# Patient Record
Sex: Male | Born: 1960
Health system: Southern US, Community
[De-identification: ages and names within clinical notes are randomized; demographics above are authoritative.]

## PROBLEM LIST (undated history)

## (undated) DIAGNOSIS — G20A1 Parkinson's disease without dyskinesia, without mention of fluctuations: Secondary | ICD-10-CM

## (undated) DIAGNOSIS — K219 Gastro-esophageal reflux disease without esophagitis: Secondary | ICD-10-CM

## (undated) DIAGNOSIS — R7301 Impaired fasting glucose: Secondary | ICD-10-CM

## (undated) DIAGNOSIS — Z8042 Family history of malignant neoplasm of prostate: Secondary | ICD-10-CM

## (undated) DIAGNOSIS — G20 Parkinson's disease: Secondary | ICD-10-CM

## (undated) HISTORY — DX: Impaired fasting glucose: R73.01

## (undated) HISTORY — DX: Parkinson's disease: G20

## (undated) HISTORY — DX: Parkinson's disease without dyskinesia, without mention of fluctuations: G20.A1

## (undated) HISTORY — DX: Family history of malignant neoplasm of prostate: Z80.42

## (undated) HISTORY — PX: KNEE SURGERY: SHX244

## (undated) HISTORY — DX: Gastro-esophageal reflux disease without esophagitis: K21.9

## (undated) HISTORY — PX: VASECTOMY: SHX75

## (undated) HISTORY — PX: TONSILLECTOMY AND ADENOIDECTOMY: SUR1326

---

## 2004-03-17 ENCOUNTER — Ambulatory Visit: Payer: Self-pay | Admitting: Internal Medicine

## 2005-06-29 ENCOUNTER — Ambulatory Visit: Payer: Self-pay | Admitting: Family Medicine

## 2005-07-31 ENCOUNTER — Ambulatory Visit: Payer: Self-pay | Admitting: Internal Medicine

## 2005-10-03 ENCOUNTER — Ambulatory Visit: Payer: Self-pay | Admitting: Internal Medicine

## 2006-06-14 DIAGNOSIS — Z9089 Acquired absence of other organs: Secondary | ICD-10-CM | POA: Insufficient documentation

## 2006-06-15 ENCOUNTER — Ambulatory Visit: Payer: Self-pay | Admitting: Internal Medicine

## 2006-07-18 ENCOUNTER — Ambulatory Visit: Payer: Self-pay | Admitting: Internal Medicine

## 2006-07-19 ENCOUNTER — Telehealth (INDEPENDENT_AMBULATORY_CARE_PROVIDER_SITE_OTHER): Payer: Self-pay | Admitting: *Deleted

## 2006-07-19 LAB — CONVERTED CEMR LAB
Free T4: 0.9 ng/dL (ref 0.6–1.6)
T3, Free: 3.5 pg/mL (ref 2.3–4.2)
TSH: 0.87 microintl units/mL (ref 0.35–5.50)

## 2006-07-20 ENCOUNTER — Encounter: Payer: Self-pay | Admitting: Internal Medicine

## 2006-07-26 ENCOUNTER — Telehealth (INDEPENDENT_AMBULATORY_CARE_PROVIDER_SITE_OTHER): Payer: Self-pay | Admitting: *Deleted

## 2006-07-27 LAB — CONVERTED CEMR LAB
Catecholamines Tot(E+NE) 24 Hr U: 0.052 mg/24hr
Dopamine 24 Hr Urine: 226 mcg/24hr (ref <500)
Epinephrine 24 Hr Urine: 6 mcg/24hr (ref <20)
Metaneph Total, Ur: 226 ug/24hr (ref 90–690)
Metanephrines, Ur: 81 (ref 26–230)
Norepinephrine 24 Hr Urine: 46 mcg/24hr (ref <80)
Normetanephrine, 24H Ur: 145 (ref 44–540)

## 2006-07-30 ENCOUNTER — Telehealth (INDEPENDENT_AMBULATORY_CARE_PROVIDER_SITE_OTHER): Payer: Self-pay | Admitting: *Deleted

## 2006-09-06 ENCOUNTER — Encounter (INDEPENDENT_AMBULATORY_CARE_PROVIDER_SITE_OTHER): Payer: Self-pay | Admitting: *Deleted

## 2006-09-06 ENCOUNTER — Ambulatory Visit: Payer: Self-pay | Admitting: Family Medicine

## 2006-09-06 DIAGNOSIS — R739 Hyperglycemia, unspecified: Secondary | ICD-10-CM | POA: Insufficient documentation

## 2006-09-06 LAB — CONVERTED CEMR LAB
Bilirubin Urine: NEGATIVE
Blood in Urine, dipstick: NEGATIVE
Glucose, Bld: 124 mg/dL
Glucose, Urine, Semiquant: NEGATIVE
Ketones, urine, test strip: NEGATIVE
Nitrite: NEGATIVE
Protein, U semiquant: NEGATIVE
Specific Gravity, Urine: 1.01
Urobilinogen, UA: NEGATIVE
WBC Urine, dipstick: NEGATIVE
pH: 6.5

## 2006-09-07 LAB — CONVERTED CEMR LAB
BUN: 15 mg/dL (ref 6–23)
CO2: 31 meq/L (ref 19–32)
Calcium: 9.6 mg/dL (ref 8.4–10.5)
Chloride: 102 meq/L (ref 96–112)
Creatinine, Ser: 0.8 mg/dL (ref 0.4–1.5)
GFR calc Af Amer: 136 mL/min
GFR calc non Af Amer: 112 mL/min
Glucose, Bld: 86 mg/dL (ref 70–99)
Hgb A1c MFr Bld: 5.5 % (ref 4.6–6.0)
Potassium: 4 meq/L (ref 3.5–5.1)
Sodium: 141 meq/L (ref 135–145)

## 2006-11-02 ENCOUNTER — Ambulatory Visit: Payer: Self-pay | Admitting: Internal Medicine

## 2006-11-02 ENCOUNTER — Telehealth (INDEPENDENT_AMBULATORY_CARE_PROVIDER_SITE_OTHER): Payer: Self-pay | Admitting: *Deleted

## 2006-11-02 DIAGNOSIS — H9319 Tinnitus, unspecified ear: Secondary | ICD-10-CM | POA: Insufficient documentation

## 2006-11-07 ENCOUNTER — Encounter (INDEPENDENT_AMBULATORY_CARE_PROVIDER_SITE_OTHER): Payer: Self-pay | Admitting: *Deleted

## 2006-11-07 ENCOUNTER — Telehealth (INDEPENDENT_AMBULATORY_CARE_PROVIDER_SITE_OTHER): Payer: Self-pay | Admitting: *Deleted

## 2006-12-11 ENCOUNTER — Ambulatory Visit: Payer: Self-pay | Admitting: Internal Medicine

## 2006-12-11 DIAGNOSIS — F411 Generalized anxiety disorder: Secondary | ICD-10-CM | POA: Insufficient documentation

## 2007-06-12 ENCOUNTER — Telehealth (INDEPENDENT_AMBULATORY_CARE_PROVIDER_SITE_OTHER): Payer: Self-pay | Admitting: *Deleted

## 2007-07-01 ENCOUNTER — Ambulatory Visit: Payer: Self-pay | Admitting: Internal Medicine

## 2007-08-09 ENCOUNTER — Ambulatory Visit: Payer: Self-pay | Admitting: Internal Medicine

## 2007-11-14 ENCOUNTER — Ambulatory Visit: Payer: Self-pay | Admitting: Internal Medicine

## 2007-11-14 DIAGNOSIS — K219 Gastro-esophageal reflux disease without esophagitis: Secondary | ICD-10-CM | POA: Insufficient documentation

## 2007-11-15 ENCOUNTER — Encounter: Payer: Self-pay | Admitting: Internal Medicine

## 2007-11-19 ENCOUNTER — Telehealth (INDEPENDENT_AMBULATORY_CARE_PROVIDER_SITE_OTHER): Payer: Self-pay | Admitting: *Deleted

## 2007-11-27 ENCOUNTER — Encounter (INDEPENDENT_AMBULATORY_CARE_PROVIDER_SITE_OTHER): Payer: Self-pay | Admitting: *Deleted

## 2008-01-17 ENCOUNTER — Ambulatory Visit: Payer: Self-pay | Admitting: Internal Medicine

## 2008-02-25 ENCOUNTER — Telehealth (INDEPENDENT_AMBULATORY_CARE_PROVIDER_SITE_OTHER): Payer: Self-pay | Admitting: *Deleted

## 2008-10-21 ENCOUNTER — Telehealth (INDEPENDENT_AMBULATORY_CARE_PROVIDER_SITE_OTHER): Payer: Self-pay | Admitting: *Deleted

## 2009-02-26 ENCOUNTER — Ambulatory Visit: Payer: Self-pay | Admitting: Internal Medicine

## 2009-06-24 ENCOUNTER — Telehealth (INDEPENDENT_AMBULATORY_CARE_PROVIDER_SITE_OTHER): Payer: Self-pay | Admitting: *Deleted

## 2009-07-26 ENCOUNTER — Ambulatory Visit: Payer: Self-pay | Admitting: Family

## 2010-02-28 ENCOUNTER — Ambulatory Visit
Admission: RE | Admit: 2010-02-28 | Discharge: 2010-02-28 | Payer: Self-pay | Source: Home / Self Care | Attending: Internal Medicine | Admitting: Internal Medicine

## 2010-02-28 ENCOUNTER — Other Ambulatory Visit: Payer: Self-pay | Admitting: Internal Medicine

## 2010-02-28 ENCOUNTER — Encounter: Payer: Self-pay | Admitting: Internal Medicine

## 2010-02-28 LAB — TSH: TSH: 1.05 u[IU]/mL (ref 0.35–5.50)

## 2010-02-28 LAB — CBC WITH DIFFERENTIAL/PLATELET
Basophils Absolute: 0 10*3/uL (ref 0.0–0.1)
Basophils Relative: 0.2 % (ref 0.0–3.0)
Eosinophils Absolute: 0 10*3/uL (ref 0.0–0.7)
Eosinophils Relative: 0.6 % (ref 0.0–5.0)
HCT: 42.8 % (ref 39.0–52.0)
Hemoglobin: 14.6 g/dL (ref 13.0–17.0)
Lymphocytes Relative: 15.8 % (ref 12.0–46.0)
Lymphs Abs: 1.1 10*3/uL (ref 0.7–4.0)
MCHC: 34.2 g/dL (ref 30.0–36.0)
MCV: 95.1 fl (ref 78.0–100.0)
Monocytes Absolute: 1.1 10*3/uL — ABNORMAL HIGH (ref 0.1–1.0)
Monocytes Relative: 14.9 % — ABNORMAL HIGH (ref 3.0–12.0)
Neutro Abs: 4.9 10*3/uL (ref 1.4–7.7)
Neutrophils Relative %: 68.5 % (ref 43.0–77.0)
Platelets: 141 10*3/uL — ABNORMAL LOW (ref 150.0–400.0)
RBC: 4.51 Mil/uL (ref 4.22–5.81)
RDW: 12.6 % (ref 11.5–14.6)
WBC: 7.2 10*3/uL (ref 4.5–10.5)

## 2010-02-28 LAB — BASIC METABOLIC PANEL
BUN: 13 mg/dL (ref 6–23)
CO2: 28 mEq/L (ref 19–32)
Calcium: 8.9 mg/dL (ref 8.4–10.5)
Chloride: 100 mEq/L (ref 96–112)
Creatinine, Ser: 1 mg/dL (ref 0.4–1.5)
GFR: 89.14 mL/min (ref >60.00)
Glucose, Bld: 99 mg/dL (ref 70–99)
Potassium: 3.7 mEq/L (ref 3.5–5.1)
Sodium: 136 mEq/L (ref 135–145)

## 2010-02-28 LAB — LIPID PANEL
Cholesterol: 201 mg/dL — ABNORMAL HIGH (ref 0–200)
HDL: 75 mg/dL (ref >39.00)
Total CHOL/HDL Ratio: 3
Triglycerides: 48 mg/dL (ref 0.0–149.0)
VLDL: 9.6 mg/dL (ref 0.0–40.0)

## 2010-02-28 LAB — HEPATIC FUNCTION PANEL
ALT: 63 U/L — ABNORMAL HIGH (ref 0–53)
AST: 53 U/L — ABNORMAL HIGH (ref 0–37)
Albumin: 4 g/dL (ref 3.5–5.2)
Alkaline Phosphatase: 54 U/L (ref 39–117)
Bilirubin, Direct: 0.1 mg/dL (ref 0.0–0.3)
Total Bilirubin: 0.7 mg/dL (ref 0.3–1.2)
Total Protein: 6.7 g/dL (ref 6.0–8.3)

## 2010-02-28 LAB — PSA: PSA: 0.37 ng/mL (ref 0.10–4.00)

## 2010-02-28 LAB — LDL CHOLESTEROL, DIRECT: Direct LDL: 121.1 mg/dL

## 2010-03-13 LAB — CONVERTED CEMR LAB
AST: 43 units/L — ABNORMAL HIGH (ref 0–37)
Alkaline Phosphatase: 58 units/L (ref 39–117)
Bilirubin, Direct: 0.2 mg/dL (ref 0.0–0.3)
Chloride: 103 meq/L (ref 96–112)
Cholesterol: 209 mg/dL (ref 0–200)
Eosinophils Absolute: 0.1 10*3/uL (ref 0.0–0.7)
GFR calc Af Amer: 118 mL/min
GFR calc non Af Amer: 97 mL/min
HCT: 44.6 % (ref 39.0–52.0)
HDL: 58.3 mg/dL (ref >39.0)
MCV: 94.6 fL (ref 78.0–100.0)
Monocytes Absolute: 0.5 10*3/uL (ref 0.1–1.0)
Monocytes Relative: 10.9 % (ref 3.0–12.0)
Neutrophils Relative %: 61.7 % (ref 43.0–77.0)
PSA: 0.38 ng/mL (ref 0.10–4.00)
Platelets: 167 10*3/uL (ref 150–400)
Potassium: 4.4 meq/L (ref 3.5–5.1)
RDW: 11.7 % (ref 11.5–14.6)
Sodium: 142 meq/L (ref 135–145)
TSH: 1.01 microintl units/mL (ref 0.35–5.50)
Total CHOL/HDL Ratio: 3.6
Triglycerides: 44 mg/dL (ref 0–149)

## 2010-03-15 NOTE — Miscellaneous (Signed)
Summary: solumedrol injection  Clinical Lists Changes  Orders: Added new Service order of Admin of Therapeutic Inj  intramuscular or subcutaneous (60454) - Signed Added new Service order of Solumedrol up to 125mg  (U9811) - Signed      Medication Administration  Injection # 1:    Medication: Solumedrol up to 125mg     Diagnosis: POISON IVY DERMATITIS (ICD-692.6)    Route: IM    Site: RUOQ gluteus    Exp Date: 10/14/2011    Lot #: [id number]    Mfr: Pharmacia    Patient tolerated injection without complications    Given by: Mervin Kung CMA (July 26, 2009 5:05 PM)  Orders Added: 1)  Admin of Therapeutic Inj  intramuscular or subcutaneous [96372] 2)  Solumedrol up to 125mg  [J2930]

## 2010-03-15 NOTE — Progress Notes (Signed)
Summary: Refill Request  Phone Note Refill Request Call back at Home Phone [phone] Call back at [phone] Message from:  Patient on Jun 24, 2009 2:37 PM  Refills Requested: Medication #1:  LORAZEPAM 0.5 MG TABS 1 every 8 -12 hrs as needed   Dosage confirmed as above?Dosage Confirmed   [facility] [address] Call patient with any questions at [phone]  Next Appointment Scheduled: none Initial call taken by: Harold Barban,  Jun 24, 2009 2:37 PM    Prescriptions: LORAZEPAM 0.5 MG TABS (LORAZEPAM) 1 every 8 -12 hrs as needed  #60 x 0   Entered by:   Shonna Chock   Authorized by:   Marga Melnick MD   Signed by:   Shonna Chock on 06/24/2009   Method used:   Printed then faxed to ...       [facility] [phone]* (retail)       [address]       [place], [place]  [address]       Ph: [phone]       Fax: [phone]   RxID:   [id number]

## 2010-03-15 NOTE — Assessment & Plan Note (Signed)
Summary: cpx/ns/kdc   Vital Signs:  Patient profile:   50 year old male Height:      73.5 inches Weight:      190.8 pounds BMI:     24.92 Temp:     98.0 degrees F oral Pulse rate:   6 / minute Resp:     14 per minute BP sitting:   114 / 82  (left arm) Cuff size:   large  Vitals Entered By: Shonna Chock (February 26, 2009 11:13 AM)  CC: CPX, not fasting today, General Medical Evaluation Comments REVIEWED MED LIST, PATIENT AGREED DOSE AND INSTRUCTION CORRECT    Primary Care Provider:  Laury Axon  CC:  CPX, not fasting today, and General Medical Evaluation.  History of Present Illness: Mark [name] is here for a physical ; he is asymptomatic except for residual pain from rib fracture post fall on ice 17 days ago.  Allergies: 1)  ! Pcn 2)  ! 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   Resp:  Complains of chest pain with inspiration; denies coughing up blood, shortness of breath, and wheezing; Rx: Hydrocodone at bedtime .  Physical Exam  General:  well-nourished,in no acute distress; alert,appropriate and cooperative throughout examination Head:   Normocephalic and atraumatic without obvious abnormalities. No apparent alopecia ; moustache & goatee Eyes:  No corneal or conjunctival inflammation noted. Perrla. Funduscopic exam benign, without hemorrhages, exudates or papilledema.  Ears:  External ear exam shows no significant lesions or deformities.  Otoscopic examination reveals clear canals, tympanic membranes are intact bilaterally without bulging, retraction, inflammation or discharge. Hearing is grossly normal bilaterally. Nose:  External nasal examination shows no deformity or inflammation. Nasal mucosa are pink and moist without lesions or exudates.Septum to R Mouth:  Oral mucosa and oropharynx without lesions or exudates.  Teeth in good repair. Neck:  No deformities, masses, or tenderness noted. Chest Wall:  Tender over rib fracture on L Lungs:  Normal respiratory effort, chest expands symmetrically. Lungs are clear to auscultation, no crackles or wheezes.Moves deliberately due to fracture Heart:  Normal rate and regular rhythm. S1 and S2 normal without gallop, murmur, click, rub . S4 Abdomen:  Bowel sounds positive,abdomen soft and non-tender without masses, organomegaly or hernias noted. Rectal:  No external abnormalities noted. Normal sphincter tone. No rectal masses or tenderness. Genitalia:  Testes bilaterally descended without nodularity, tenderness or masses. No scrotal masses or lesions. No penis lesions or urethral discharge. L varicocele.   Prostate:  Prostate gland firm and smooth, no enlargement, nodularity, tenderness, mass, asymmetry or induration. Msk:  No deformity or scoliosis noted of thoracic or lumbar spine.   Pulses:  R and L carotid,radial,dorsalis pedis and posterior tibial pulses are full and equal bilaterally Extremities:  No clubbing, cyanosis, edema, or deformity noted with normal full range of motion of all joints.   Neurologic:  alert &  oriented X3 and DTRs symmetrical and normal.   Skin:  Intact without  suspicious lesions or rashes. No bruie L chest Cervical Nodes:  No lymphadenopathy noted Axillary Nodes:  No palpable lymphadenopathy Psych:  memory intact for recent and remote, normally interactive, and good eye contact.     Impression & Recommendations:  Problem # 1:  PREVENTIVE HEALTH CARE (ICD-V70.0)  Orders: EKG w/ Interpretation (93000)  Problem # 2:  FRACTURE, RIB, LEFT (ICD-807.00)  Problem # 3:  NEOPLASM, MALIGNANT, PROSTATE, FAMILY HX, FATHER (ICD-V16.42)  Problem # 4:  BREAST CANCER, FAMILY HX (ICD-V16.3)  Problem # 5:  GERD (ICD-530.81) stable His updated medication list for this problem includes:    Ranitidine Hcl 150 Mg Tabs (Ranitidine hcl) .Marland Kitchen... 1 every 12 hrs ac prn  Complete Medication List: 1)  Lorazepam 0.5 Mg Tabs (Lorazepam) .Marland Kitchen.. 1 every 8 -12 hrs as needed 2)  Ranitidine Hcl 150 Mg Tabs (Ranitidine hcl) .Marland Kitchen.. 1 every 12 hrs ac prn  Patient Instructions: 1)  Avoid foods high in acid (tomatoes, citrus juices, spicy foods). Avoid eating within two hours of lying down or before exercising. Do not over eat; try smaller more frequent meals. Elevate head of bed twelve inches when sleeping.Calcium 600 mg daily & vit D 1000 IU daily X 6 weeks. Please schedule fasting labs: 2)  BMP ; 3)  Hepatic Panel ; 4)  Lipid Panel ; 5)  TSH ; 6)  CBC w/ Diff; 7)  PSA

## 2010-03-15 NOTE — Letter (Signed)
Summary: Cancer Screening/[facility] Personalized Risk Profile  Cancer Screening/[facility] Personalized Risk Profile   Imported By: Lanelle Bal 03/03/2009 12:30:24  _____________________________________________________________________  External Attachment:    Type:   Image     Comment:   External Document

## 2010-03-15 NOTE — Assessment & Plan Note (Signed)
Summary: POISON IVY? ON LEGS--MAY COME IN AT 1:35///SPHRoom 5   Vital Signs:  Patient profile:   50 year old male Height:      73.5 inches Weight:      187.25 pounds BMI:     24.46 Temp:     97.7 degrees F oral Pulse rate:   66 / minute Pulse rhythm:   regular Resp:     16 per minute BP sitting:   110 / 70  (right arm) Cuff size:   regular  Vitals Entered By: Mervin Kung CMA (July 26, 2009 1:44 PM) CC: Room 5  Red, ichy rash on legs and spreading to face. Is Patient Diabetic? No Comments Pt states he does not need Ranitidine at all and is no longer taking.   Primary Care Provider:  Laury Axon  CC:  Room 5  Red and ichy rash on legs and spreading to face.Marland Kitchen  History of Present Illness: Mark [name] is a 50 year old male who presents today with complaint of red itching rash which started on Friday.  Did yard work on Thursday.  Patient has tried OTC "no itch cream" without much improvement.  He comes today to seek further  treatment of his rash.    Allergies: 1)  ! Pcn 2)  ! * Paraxetine  Physical Exam  General:  Well-developed,well-nourished,in no acute distress; alert,appropriate and cooperative throughout examination Skin:  + raised rash noted behind left knee.  Left shin.  Notes + itching beside nose (L) but no clear rash as of yet Psych:  Cognition and judgment appear intact. Alert and cooperative with normal attention span and concentration. No apparent delusions, illusions, hallucinations   Impression & Recommendations:  Problem # 1:  POISON IVY DERMATITIS (ICD-692.6) Assessment New  Plan solumedrol IM today, followed by a short prednisone taper.  Pt instructed to call if symptoms worsen or do not improve.  His updated medication list for this problem includes:    Prednisone 10 Mg Tabs (Prednisone) .Marland Kitchen... Take 4 tablets daily x 2 days, then 3 tabs daily x 2 days, then 2 tabs daily x 2 days, then 1 tab daily for 2 days then stop.  Complete Medication List: 1)   Lorazepam 0.5 Mg Tabs (Lorazepam) .Marland Kitchen.. 1 every 8 -12 hrs as needed 2)  Prednisone 10 Mg Tabs (Prednisone) .... Take 4 tablets daily x 2 days, then 3 tabs daily x 2 days, then 2 tabs daily x 2 days, then 1 tab daily for 2 days then stop.  Patient Instructions: 1)  Call if symptoms worsen or do not improve. 2)  You can take Benadryl 25mg  by mouth at bedtime as needed for itching.  This med can also be used during the day- however it may make you sleepy. Prescriptions: PREDNISONE 10 MG TABS (PREDNISONE) take 4 tablets daily x 2 days, then 3 tabs daily x 2 days, then 2 tabs daily x 2 days, then 1 tab daily for 2 days then stop.  #20 x 0   Entered and Authorized by:   Mark Fillers FNP   Signed by:   Mark Fillers FNP on 07/26/2009   Method used:   [facility] to        [facility] [phone]* (retail)       [address]       [place], [place]  [address]       Ph: [phone]  Fax: [phone]   RxID:   [id number]   Current Allergies (reviewed today): ! PCN ! * PARAXETINE

## 2010-03-17 NOTE — Assessment & Plan Note (Signed)
Summary: cpx/lab/cbs   Vital Signs:  Patient profile:   50 year old male Height:      74 inches Weight:      183.6 pounds Temp:     98.1 degrees F oral Pulse rate:   64 / minute Resp:     14 per minute BP sitting:   116 / 80  (left arm) Cuff size:   regular  Vitals Entered By: Shonna Chock CMA (February 28, 2010 8:37 AM)    Primary Care Provider:  Laury Axon   History of Present Illness:    Mr. Mark [name] is here for a physical; he is asymptomatic.   The patient reports nasal congestion,  intermittent sore throat, and productive cough with clear sputum X 3 days. He  denies purulent nasal discharge and earache.  The patient denies fever, dyspnea, and wheezing.  The patient denies frontal  headache & bilateral facial pain and tooth pain.    Current Medications (verified): 1)  Lorazepam 0.5 Mg Tabs (Lorazepam) .Marland Kitchen.. 1 Every 8 -12 Hrs As Needed  Allergies: 1)  ! Pcn 2)  ! 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    Physical Exam  General:  well-nourished; alert,appropriate and cooperative throughout  examination Head:  Normocephalic and atraumatic without obvious abnormalities. Goatee Eyes:  No corneal or conjunctival inflammation noted.Perrla. Funduscopic exam benign, without hemorrhages, exudates or papilledema.  Ears:  External ear exam shows no significant lesions or deformities.  Otoscopic examination reveals clear canals, tympanic membranes are intact bilaterally without bulging, retraction, inflammation or discharge. Hearing is grossly normal bilaterally. Nose:  External nasal examination shows no deformity or inflammation. Nasal mucosa are pink and moist without lesions or exudates. Mouth:  Oral mucosa and oropharynx without lesions or exudates.  Teeth in good repair. Neck:  No deformities, masses, or tenderness noted. Lungs:  Normal respiratory effort, chest expands symmetrically. Lungs are clear to auscultation, no crackles or wheezes. Heart:  Normal rate and regular rhythm. S1 and S2 normal without gallop, murmur, click, rub or other extra sounds. Abdomen:  Bowel sounds positive,abdomen soft and non-tender without masses, organomegaly or hernias noted. Rectal:  No external abnormalities noted. Normal sphincter tone. No rectal masses or tenderness. Genitalia:  Testes bilaterally descended without nodularity, tenderness or masses. No scrotal masses or lesions. No penis lesions or urethral discharge. L varicocele.   Prostate:  Prostate gland firm and smooth, no enlargement, nodularity, tenderness, mass, asymmetry or induration. Msk:  No deformity or scoliosis noted of thoracic or lumbar spine.   Pulses:  R and L carotid,radial,dorsalis pedis and posterior tibial pulses are full and equal bilaterally Extremities:  No clubbing, cyanosis, edema, or deformity noted with normal full range of motion of all joints.   Neurologic:  alert & oriented X3, gait normal, and DTRs symmetrical and  normal.   Skin:  Intact without suspicious lesions or rashes Cervical Nodes:  No lymphadenopathy  noted Axillary Nodes:  No palpable lymphadenopathy Inguinal Nodes:  No significant adenopathy Psych:  memory intact for recent and remote, normally interactive, and good eye contact.     Impression & Recommendations:  Problem # 1:  ROUTINE GENERAL MEDICAL EXAM@HEALTH  CARE FACL (ICD-V70.0)  Orders: EKG w/ Interpretation (93000) Venipuncture (04540) TLB-Lipid Panel (80061-LIPID) TLB-BMP (Basic Metabolic Panel-BMET) (80048-METABOL) TLB-CBC Platelet - w/Differential (85025-CBCD) TLB-Hepatic/Liver Function Pnl (80076-HEPATIC) TLB-TSH (Thyroid Stimulating Hormone) (84443-TSH) TLB-PSA (Prostate Specific Antigen) (84153-PSA) Specimen Handling (98119)  Problem # 2:  GERD (ICD-530.81) stable  Problem # 3:  NEOPLASM, MALIGNANT, PROSTATE, FAMILY HX, FATHER (ICD-V16.42)  Problem # 4:  PHARYNGITIS-ACUTE (ICD-462)  Orders: Rapid Strep (14782) Specimen Handling (95621)  Complete Medication List: 1)  Lorazepam 0.5 Mg Tabs (Lorazepam) .Marland Kitchen.. 1 every 8 -12 hrs as needed  Other Orders: Tdap => 58yrs IM (30865) Admin 1st Vaccine (78469)  Patient Instructions: 1)  Neti pot once daily -  two times a day as needed for congestion. Zicam as needed for the sore throat. Report pain , pus & fever as discussed. Prescriptions: LORAZEPAM 0.5 MG TABS (LORAZEPAM) 1 every 8 -12 hrs as needed  #60 x 2   Entered and Authorized by:   Marga Melnick MD   Signed by:   Marga Melnick MD on 02/28/2010   Method used:   Print then Give to Patient   RxID:   [id number]    Orders Added: 1)  Tdap => 28yrs IM [90715] 2)  Admin 1st Vaccine [90471] 3)  Est. Patient 40-64 years [99396] 4)  EKG w/ Interpretation [93000] 5)  Venipuncture [36415] 6)  TLB-Lipid Panel [80061-LIPID] 7)  TLB-BMP (Basic Metabolic Panel-BMET) [80048-METABOL] 8)  TLB-CBC Platelet - w/Differential [85025-CBCD] 9)  TLB-Hepatic/Liver Function Pnl [80076-HEPATIC] 10)  TLB-TSH (Thyroid Stimulating Hormone) [84443-TSH] 11)  TLB-PSA  (Prostate Specific Antigen) [84153-PSA] 12)  Rapid Strep [72536] 13)  Specimen Handling [99000]   Immunizations Administered:  Tetanus Vaccine:    Vaccine Type: Tdap    Site: right deltoid    Mfr: GlaxoSmithKline    Dose: 0.5 ml    Route: IM    Given by: Shonna Chock CMA    Exp. Date: 12/03/2011    Lot #: [id number]    VIS given: 01/01/08 version given February 28, 2010.   Immunizations Administered:  Tetanus Vaccine:    Vaccine Type: Tdap    Site: right deltoid    Mfr: GlaxoSmithKline    Dose: 0.5 ml    Route: IM    Given by: Shonna Chock CMA    Exp. Date: 12/03/2011    Lot #: [id number]    VIS given: 01/01/08 version given February 28, 2010.    Appended Document: cpx/lab/cbs  Laboratory Results    Other Tests  Rapid Strep: negative

## 2011-03-08 ENCOUNTER — Ambulatory Visit (INDEPENDENT_AMBULATORY_CARE_PROVIDER_SITE_OTHER): Payer: 59 | Admitting: Internal Medicine

## 2011-03-08 ENCOUNTER — Encounter: Payer: Self-pay | Admitting: Internal Medicine

## 2011-03-08 VITALS — BP 108/80 | HR 52 | Temp 97.6°F | Resp 12 | Ht 74.0 in | Wt 176.6 lb

## 2011-03-08 DIAGNOSIS — R9431 Abnormal electrocardiogram [ECG] [EKG]: Secondary | ICD-10-CM | POA: Insufficient documentation

## 2011-03-08 DIAGNOSIS — Z0000 Encounter for general adult medical examination without abnormal findings: Secondary | ICD-10-CM

## 2011-03-08 LAB — CBC WITH DIFFERENTIAL/PLATELET
Basophils Relative: 0.5 % (ref 0.0–3.0)
Eosinophils Relative: 1.4 % (ref 0.0–5.0)
HCT: 43 % (ref 39.0–52.0)
Hemoglobin: 14.6 g/dL (ref 13.0–17.0)
Lymphs Abs: 1.5 10*3/uL (ref 0.7–4.0)
MCV: 95.5 fl (ref 78.0–100.0)
Monocytes Absolute: 0.6 10*3/uL (ref 0.1–1.0)
Neutro Abs: 3.8 10*3/uL (ref 1.4–7.7)
Neutrophils Relative %: 63.6 % (ref 43.0–77.0)
RBC: 4.5 Mil/uL (ref 4.22–5.81)
WBC: 6 10*3/uL (ref 4.5–10.5)

## 2011-03-08 LAB — HEPATIC FUNCTION PANEL
AST: 24 U/L (ref 0–37)
Total Bilirubin: 1 mg/dL (ref 0.3–1.2)

## 2011-03-08 LAB — LIPID PANEL
Total CHOL/HDL Ratio: 3
Triglycerides: 50 mg/dL (ref 0.0–149.0)

## 2011-03-08 LAB — BASIC METABOLIC PANEL
Chloride: 105 mEq/L (ref 96–112)
Potassium: 4 mEq/L (ref 3.5–5.1)
Sodium: 141 mEq/L (ref 135–145)

## 2011-03-08 MED ORDER — LORAZEPAM 0.5 MG PO TABS: 0.5000 mg | ORAL_TABLET | Freq: Three times a day (TID) | ORAL | Status: AC | PRN

## 2011-03-08 NOTE — Patient Instructions (Signed)
Preventive Health Care: Eat a low-fat diet with lots of fruits and vegetables, up to 7-9 servings per day. Consume less than 40 grams of sugar per day from foods & drinks with High Fructose Corn Sugar as # 1,2,3 or # 4 on label. Health Care Power of Attorney & Living Will. Complete if not in place ; these place you in charge of your health care decisions. As per the Standard of Care , screening Colonoscopy recommended @ 50 & every 5-10 years thereafter . More frequent monitor would be dictated by family history or findings @ Colonoscopy . Because of your family history of colon cancer in father; please check with your insurance company to see if they've will authorize a colonoscopy at this time .

## 2011-03-08 NOTE — Progress Notes (Signed)
Subjective:    Patient ID: Mark [name], male    DOB: 11/12/1960, 51 y.o.   MRN: [id number]  HPI  Mark [name]  is here for a physical;acute issues include travel related anxiety.Lorazepam is effective.      Review of Systems Patient reports no  vision/ hearing changes,anorexia, weight change, fever ,adenopathy, persistant / recurrent hoarseness, swallowing issues, chest pain,palpitations, edema,persistant / recurrent cough, hemoptysis, dyspnea(rest, exertional, paroxysmal nocturnal), gastrointestinal  bleeding (melena, rectal bleeding), abdominal pain, excessive heart burn, GU symptoms( dysuria, hematuria, pyuria, voiding/incontinence  issues) syncope, focal weakness, memory loss,numbness & tingling, skin/hair/nail changes, abnormal bruising/bleeding,or musculoskeletal symptoms/signs.      Objective:   Physical Exam Gen.: Thin but healthy and well-nourished in appearance. Alert, appropriate and cooperative throughout exam. Head: Normocephalic without obvious abnormalities;  no alopecia . Goatee Eyes: No corneal or conjunctival inflammation noted. Pupils equal round reactive to light and accommodation. Fundal exam is benign without hemorrhages, exudate, papilledema. Extraocular motion intact. Vision grossly normal. Ears: External  ear exam reveals no significant lesions or deformities. Canals clear .TMs normal. Hearing is grossly normal bilaterally. Nose: External nasal exam reveals no deformity or inflammation. Nasal mucosa are pink and moist. No lesions or exudates noted. Septum slightly deviated to R Mouth: Oral mucosa and oropharynx reveal no lesions or exudates. Teeth in good repair. Neck: No deformities, masses, or tenderness noted. Range of motion & Thyroid normal. Lungs: Normal respiratory effort; chest expands symmetrically. Lungs are clear to auscultation without rales, wheezes, or increased work of breathing. Heart: Normal rate and rhythm. Normal S1 and S2. No gallop, click, or rub. No  murmur. Abdomen: Bowel sounds normal; abdomen soft and nontender. No masses, organomegaly or hernias noted. Genitalia/ DRE: Varices present in the left scrotum. Vasectomy scar tissue present. Prostate reveals no significant enlargement or nodularity, asymmetry or induration                                                                                Musculoskeletal/extremities: No deformity or scoliosis noted of  the thoracic or lumbar spine. No clubbing, cyanosis, edema, or deformity noted. Range of motion  normal .Tone & strength  normal.Joints normal. Nail health  good. Vascular: Carotid, radial artery, dorsalis pedis and  posterior tibial pulses are full and equal. No bruits present. Neurologic: Alert and oriented x3. Deep tendon reflexes symmetrical and normal.          Skin: Intact without suspicious lesions or rashes. Lymph: No cervical, axillary, or inguinal lymphadenopathy present. Psych: Mood and affect are normal. Normally interactive                                                                                         Assessment & Plan:  #1 comprehensive physical exam; no acute findings #2 see Problem List with Assessments & Recommendations Plan: see Orders

## 2011-07-05 ENCOUNTER — Ambulatory Visit (INDEPENDENT_AMBULATORY_CARE_PROVIDER_SITE_OTHER): Payer: 59 | Admitting: Family

## 2011-07-05 ENCOUNTER — Encounter: Payer: Self-pay | Admitting: Family

## 2011-07-05 VITALS — BP 114/66 | HR 62 | Temp 98.2°F | Resp 16 | Wt 179.1 lb

## 2011-07-05 DIAGNOSIS — L255 Unspecified contact dermatitis due to plants, except food: Secondary | ICD-10-CM

## 2011-07-05 DIAGNOSIS — L237 Allergic contact dermatitis due to plants, except food: Secondary | ICD-10-CM | POA: Insufficient documentation

## 2011-07-05 MED ORDER — PREDNISONE 10 MG PO TABS: ORAL_TABLET | ORAL | Status: DC

## 2011-07-05 NOTE — Assessment & Plan Note (Signed)
Will give prednisone taper, recommended benadryl prn.

## 2011-07-05 NOTE — Progress Notes (Signed)
  Subjective:    Patient ID: Mark [name], male    DOB: [birth year], 51 y.o.   MRN: [id number]  HPI  Mr.  [name] is a 51 yr old male who presents today with chief complaint of rash.  He reports that he did some yard work over the weekend and then developed an itchy rash on his right hand and right cheek.  He has not tried any otc preparations.    Review of Systems See HPI  Past Medical History  Diagnosis Date  . Tinnitus   . GERD (gastroesophageal reflux disease)   . Fasting hyperglycemia      PMH of    History   Social History  . Marital Status: Married    Spouse Name: N/A    Number of Children: N/A  . Years of Education: N/A   Occupational History  . Not on file.   Social History Main Topics  . Smoking status: Never Smoker   . Smokeless tobacco: Not on file  . Alcohol Use: No  . Drug Use: No  . Sexually Active: Not on file   Other Topics Concern  . Not on file   Social History Narrative  . No narrative on file    Past Surgical History  Procedure Date  . Tonsillectomy and adenoidectomy   . Vasectomy     Family History  Problem Relation Age of Onset  . COPD Mother   . Cancer Father 70    colon  . Cancer Sister     breast  . COPD Paternal Aunt   . Stroke Maternal Grandfather     Allergies  Allergen Reactions  . Penicillins     No current outpatient prescriptions on file prior to visit.    BP 114/66  Pulse 62  Temp(Src) 98.2 F (36.8 C) (Oral)  Resp 16  Wt 179 lb 1.3 oz (81.23 kg)  SpO2 98%       Objective:   Physical Exam  Constitutional: He appears well-developed and well-nourished. No distress.  Cardiovascular: Normal rate and regular rhythm.   No murmur heard. Pulmonary/Chest: Effort normal and breath sounds normal. No respiratory distress. He has no wheezes. He has no rales. He exhibits no tenderness.  Skin:       Erythematous rash, right hand between fingers, rash on right cheek.           Assessment & Plan:

## 2011-07-05 NOTE — Patient Instructions (Signed)
Poison Ivy Poison ivy is a inflammation of the skin (contact dermatitis) caused by touching the allergens on the leaves of the ivy plant following previous exposure to the plant. The rash usually appears 48 hours after exposure. The rash is usually bumps (papules) or blisters (vesicles) in a linear pattern. Depending on your own sensitivity, the rash may simply cause redness and itching, or it may also progress to blisters which may break open. These must be well cared for to prevent secondary bacterial (germ) infection, followed by scarring. Keep any open areas dry, clean, dressed, and covered with an antibacterial ointment if needed. The eyes may also get puffy. The puffiness is worst in the morning and gets better as the day progresses. This dermatitis usually heals without scarring, within 2 to 3 weeks without treatment. HOME CARE INSTRUCTIONS  Thoroughly wash with soap and water as soon as you have been exposed to poison ivy. You have about one half hour to remove the plant resin before it will cause the rash. This washing will destroy the oil or antigen on the skin that is causing, or will cause, the rash. Be sure to wash under your fingernails as any plant resin there will continue to spread the rash. Do not rub skin vigorously when washing affected area. Poison ivy cannot spread if no oil from the plant remains on your body. A rash that has progressed to weeping sores will not spread the rash unless you have not washed thoroughly. It is also important to wash any clothes you have been wearing as these may carry active allergens. The rash will return if you wear the unwashed clothing, even several days later. Avoidance of the plant in the future is the best measure. Poison ivy plant can be recognized by the number of leaves. Generally, poison ivy has three leaves with flowering branches on a single stem. Diphenhydramine may be purchased over the counter and used as needed for itching. Do not drive with  this medication if it makes you drowsy.Ask your caregiver about medication for children. SEEK MEDICAL CARE IF:  Open sores develop.   Redness spreads beyond area of rash.   You notice purulent (pus-like) discharge.   You have increased pain.   Other signs of infection develop (such as fever).  Document Released: 01/28/2000 Document Revised: 01/19/2011 Document Reviewed: 12/16/2008 ExitCare Patient Information 2012 [employer]. 

## 2012-02-19 ENCOUNTER — Telehealth: Payer: Self-pay | Admitting: Internal Medicine

## 2012-02-19 MED ORDER — LORAZEPAM 0.5 MG PO TABS: 0.5000 mg | ORAL_TABLET | Freq: Three times a day (TID) | ORAL | Status: DC | PRN

## 2012-02-19 NOTE — Telephone Encounter (Signed)
Refill: Lorazepam 0.5mg  tab. Take 1 tablet every 8 hours as needed for anxiety.

## 2012-02-19 NOTE — Telephone Encounter (Signed)
Last OV 03/08/2011, pending appointment 03/11/12.  I spoke with patient and he only takes this when traveling (work related). Hopp please advise if ok to fill

## 2012-02-19 NOTE — Telephone Encounter (Signed)
OK #30 , R X1 

## 2012-03-08 ENCOUNTER — Encounter: Payer: Self-pay | Admitting: Lab

## 2012-03-11 ENCOUNTER — Ambulatory Visit (INDEPENDENT_AMBULATORY_CARE_PROVIDER_SITE_OTHER): Payer: 59 | Admitting: Internal Medicine

## 2012-03-11 ENCOUNTER — Encounter: Payer: Self-pay | Admitting: Internal Medicine

## 2012-03-11 VITALS — BP 108/66 | HR 67 | Temp 98.0°F | Resp 12 | Ht 73.08 in | Wt 182.0 lb

## 2012-03-11 DIAGNOSIS — Z23 Encounter for immunization: Secondary | ICD-10-CM

## 2012-03-11 DIAGNOSIS — Z0000 Encounter for general adult medical examination without abnormal findings: Secondary | ICD-10-CM

## 2012-03-11 LAB — LIPID PANEL
Cholesterol: 207 mg/dL — ABNORMAL HIGH (ref 0–200)
HDL: 72.8 mg/dL (ref >39.00)
Total CHOL/HDL Ratio: 3
Triglycerides: 50 mg/dL (ref 0.0–149.0)
VLDL: 10 mg/dL (ref 0.0–40.0)

## 2012-03-11 LAB — BASIC METABOLIC PANEL
CO2: 30 mEq/L (ref 19–32)
Calcium: 9.2 mg/dL (ref 8.4–10.5)
Creatinine, Ser: 0.9 mg/dL (ref 0.4–1.5)
Glucose, Bld: 85 mg/dL (ref 70–99)

## 2012-03-11 LAB — LDL CHOLESTEROL, DIRECT: Direct LDL: 106.5 mg/dL

## 2012-03-11 LAB — HEPATIC FUNCTION PANEL
Albumin: 4.2 g/dL (ref 3.5–5.2)
Alkaline Phosphatase: 47 U/L (ref 39–117)

## 2012-03-11 LAB — CBC WITH DIFFERENTIAL/PLATELET
Basophils Absolute: 0 10*3/uL (ref 0.0–0.1)
Eosinophils Absolute: 0 10*3/uL (ref 0.0–0.7)
Hemoglobin: 14.2 g/dL (ref 13.0–17.0)
Lymphocytes Relative: 26.9 % (ref 12.0–46.0)
MCHC: 33.8 g/dL (ref 30.0–36.0)
MCV: 94.1 fl (ref 78.0–100.0)
Monocytes Absolute: 0.5 10*3/uL (ref 0.1–1.0)
Neutro Abs: 3.4 10*3/uL (ref 1.4–7.7)
Neutrophils Relative %: 62.9 % (ref 43.0–77.0)
RDW: 12.8 % (ref 11.5–14.6)

## 2012-03-11 NOTE — Patient Instructions (Addendum)
Take the EKG to any emergency room or preop visits. There are nonspecific changes; as long as there is no new change these are not clinically significant . If the old EKG is not available for comparison; it may result in unnecessary hospitalization for observation with significant unnecessary expense. If you activate My Chart; the results can be released to you as soon as they populate from the lab. If you choose not to use this program; the labs have to be reviewed, copied & mailed   causing a delay in getting the results to you.

## 2012-03-11 NOTE — Progress Notes (Signed)
  Subjective:    Patient ID: Mark [name], male    DOB: 1960/10/13, 53 y.o.   MRN: [id number]  HPI  Mark [name] is here for a physical; he denies acute issues.     Review of Systems He is on a modified  heart healthy diet; he exercises as running 3.5 mpd 4 times per week without symptoms. Specifically he denies chest pain, palpitations, dyspnea, or claudication. Family history is negative for premature coronary disease or CVA.     Objective:   Physical Exam Gen.: Thin but healthy and well-nourished in appearance. Alert, appropriate and cooperative throughout exam. Appears younger than stated age  Head: Normocephalic without obvious abnormalities. Goatee ; no alopecia  Eyes: No corneal or conjunctival inflammation noted. Pupils equal round reactive to light and accommodation. Fundal exam is benign without hemorrhages, exudate, papilledema. Extraocular motion intact. Vision grossly normal. Ears: External  ear exam reveals no significant lesions or deformities. Canals clear .TMs normal. Hearing is grossly normal bilaterally. Nose: External nasal exam reveals no deformity or inflammation. Nasal mucosa are pink and moist. No lesions or exudates noted. Septum to R  Mouth: Oral mucosa and oropharynx reveal no lesions or exudates. Teeth in good repair. Neck: No deformities, masses, or tenderness noted. Range of motion & Thyroid normal. Lungs: Normal respiratory effort; chest expands symmetrically. Lungs are clear to auscultation without rales, wheezes, or increased work of breathing. Heart: Normal rate and rhythm. Normal S1 and S2. No gallop, click, or rub. No murmur. Abdomen: Bowel sounds normal; abdomen soft and nontender. No masses, organomegaly or hernias noted. Genitalia: Genitalia normal except for left varices. Prostate is normal without enlargement, asymmetry, nodularity, or induration.                                    Musculoskeletal/extremities: No deformity or scoliosis noted of  the thoracic  or lumbar spine. No clubbing, cyanosis, edema, or significant extremity  deformity noted. Range of motion normal .Tone & strength  normal.Joints normal . Nail health good. Able to lie down & sit up w/o help. Negative SLR bilaterally Vascular: Carotid, radial artery, dorsalis pedis and  posterior tibial pulses are full and equal. No bruits present. Neurologic: Alert and oriented x3. Deep tendon reflexes symmetrical and normal.  Skin: Intact without suspicious lesions or rashes. Lymph: No cervical, axillary, or inguinal lymphadenopathy present. Psych: Mood and affect are normal. Normally interactive                                                                                         Assessment & Plan:  #1 comprehensive physical exam; no acute findings #2 see Problem List with Assessments & Recommendations Plan: see Orders

## 2012-03-13 ENCOUNTER — Encounter: Payer: Self-pay | Admitting: *Deleted

## 2012-06-25 ENCOUNTER — Ambulatory Visit (INDEPENDENT_AMBULATORY_CARE_PROVIDER_SITE_OTHER): Payer: 59 | Admitting: Family Medicine

## 2012-06-25 ENCOUNTER — Encounter: Payer: Self-pay | Admitting: Family Medicine

## 2012-06-25 VITALS — BP 104/64 | HR 81 | Temp 98.3°F | Wt 173.8 lb

## 2012-06-25 DIAGNOSIS — M545 Low back pain, unspecified: Secondary | ICD-10-CM

## 2012-06-25 MED ORDER — CYCLOBENZAPRINE HCL 10 MG PO TABS: 10.0000 mg | ORAL_TABLET | Freq: Three times a day (TID) | ORAL | Status: DC | PRN

## 2012-06-25 NOTE — Progress Notes (Signed)
  Subjective:    Mark [name] is a 52 y.o. male who presents for evaluation of low back pain. The patient has had no prior back problems. Symptoms have been present for 2 days and are gradually worsening.  Onset was related to / precipitated by yard work over weekend. The pain is located in the across the lower back and radiates to the right thigh. The pain is described as burning and occurs all day. He rates his pain as moderate. Symptoms are exacerbated by getting up from sitting, bending. Symptoms are improved by rest. He has also tried nothing which provided no symptom relief. He has no other symptoms associated with the back pain. The patient has no "red flag" history indicative of complicated back pain.  He states it was getting worse until today it seems better.  The following portions of the patient's history were reviewed and updated as appropriate:  He  has a past medical history of Tinnitus; GERD (gastroesophageal reflux disease); Fasting hyperglycemia; and Family history of prostate cancer. He  does not have any pertinent problems on file. He  has past surgical history that includes Tonsillectomy and adenoidectomy and Vasectomy. His family history includes Breast cancer in his sister; COPD in his mother and paternal aunt; Prostate cancer (age of onset: 70) in his father; and Stroke in his maternal grandfather.  There is no history of Diabetes and Heart disease. He  reports that he has never smoked. He does not have any smokeless tobacco history on file. He reports that he does not drink alcohol or use illicit drugs. He has a current medication list which includes the following prescription(s): lorazepam and cyclobenzaprine. Current Outpatient Prescriptions on File Prior to Visit  Medication Sig Dispense Refill  . LORazepam (ATIVAN) 0.5 MG tablet Take 1 tablet (0.5 mg total) by mouth every 8 (eight) hours as needed.  30 tablet  1   No current facility-administered medications on file prior  to visit.   He is allergic to penicillins..  Review of Systems Pertinent items are noted in HPI.    Objective:   Full range of motion without pain, no tenderness, no spasm, no curvature. Normal reflexes, gait, strength and negative straight-leg raise.    Assessment:    Nonspecific acute low back pain    Plan:    Natural history and expected course discussed. Questions answered. Agricultural engineer distributed. Proper lifting, bending technique discussed. Stretching exercises discussed. Short (2-4 day) period of relative rest recommended until acute symptoms improve. Ice to affected area as needed for local pain relief. Heat to affected area as needed for local pain relief. OTC analgesics as needed. Muscle relaxants per medication orders. Patient can return to regular work on 06/26/2012. Follow-up in 2 weeks.

## 2012-06-25 NOTE — Patient Instructions (Signed)
Back Pain, Adult Low back pain is very common. About 1 in 5 people have back pain.The cause of low back pain is rarely dangerous. The pain often gets better over time.About half of people with a sudden onset of back pain feel better in just 2 weeks. About 8 in 10 people feel better by 6 weeks.  CAUSES Some common causes of back pain include:  Strain of the muscles or ligaments supporting the spine.  Wear and tear (degeneration) of the spinal discs.  Arthritis.  Direct injury to the back. DIAGNOSIS Most of the time, the direct cause of low back pain is not known.However, back pain can be treated effectively even when the exact cause of the pain is unknown.Answering your caregiver's questions about your overall health and symptoms is one of the most accurate ways to make sure the cause of your pain is not dangerous. If your caregiver needs more information, he or she may order lab work or imaging tests (X-rays or MRIs).However, even if imaging tests show changes in your back, this usually does not require surgery. HOME CARE INSTRUCTIONS For many people, back pain returns.Since low back pain is rarely dangerous, it is often a condition that people can learn to manageon their own.   Remain active. It is stressful on the back to sit or stand in one place. Do not sit, drive, or stand in one place for more than 30 minutes at a time. Take short walks on level surfaces as soon as pain allows.Try to increase the length of time you walk each day.  Do not stay in bed.Resting more than 1 or 2 days can delay your recovery.  Do not avoid exercise or work.Your body is made to move.It is not dangerous to be active, even though your back may hurt.Your back will likely heal faster if you return to being active before your pain is gone.  Pay attention to your body when you bend and lift. Many people have less discomfortwhen lifting if they bend their knees, keep the load close to their bodies,and  avoid twisting. Often, the most comfortable positions are those that put less stress on your recovering back.  Find a comfortable position to sleep. Use a firm mattress and lie on your side with your knees slightly bent. If you lie on your back, put a pillow under your knees.  Only take over-the-counter or prescription medicines as directed by your caregiver. Over-the-counter medicines to reduce pain and inflammation are often the most helpful.Your caregiver may prescribe muscle relaxant drugs.These medicines help dull your pain so you can more quickly return to your normal activities and healthy exercise.  Put ice on the injured area.  Put ice in a plastic bag.  Place a towel between your skin and the bag.  Leave the ice on for 15 to 20 minutes, 3 to 4 times a day for the first 2 to 3 days. After that, ice and heat may be alternated to reduce pain and spasms.  Ask your caregiver about trying back exercises and gentle massage. This may be of some benefit.  Avoid feeling anxious or stressed.Stress increases muscle tension and can worsen back pain.It is important to recognize when you are anxious or stressed and learn ways to manage it.Exercise is a great option. SEEK MEDICAL CARE IF:  You have pain that is not relieved with rest or medicine.  You have pain that does not improve in 1 week.  You have new symptoms.  You are generally   not feeling well. SEEK IMMEDIATE MEDICAL CARE IF:   You have pain that radiates from your back into your legs.  You develop new bowel or bladder control problems.  You have unusual weakness or numbness in your arms or legs.  You develop nausea or vomiting.  You develop abdominal pain.  You feel faint. Document Released: 01/30/2005 Document Revised: 08/01/2011 Document Reviewed: 06/20/2010 ExitCare Patient Information 2013 [employer].  

## 2012-08-09 ENCOUNTER — Other Ambulatory Visit: Payer: Self-pay | Admitting: Internal Medicine

## 2012-08-11 ENCOUNTER — Other Ambulatory Visit: Payer: Self-pay | Admitting: Internal Medicine

## 2012-08-12 NOTE — Telephone Encounter (Signed)
OK X1 

## 2012-08-12 NOTE — Telephone Encounter (Signed)
Ok to refill? Last OV 5.13.14 Last filled 1.6.14

## 2012-08-12 NOTE — Telephone Encounter (Signed)
Ok to refill? Last OV 1.27.14 Last filled 1.6.14

## 2013-01-07 ENCOUNTER — Other Ambulatory Visit: Payer: Self-pay | Admitting: *Deleted

## 2013-01-07 ENCOUNTER — Encounter: Payer: Self-pay | Admitting: *Deleted

## 2013-01-07 ENCOUNTER — Telehealth: Payer: Self-pay | Admitting: *Deleted

## 2013-01-07 MED ORDER — LORAZEPAM 0.5 MG PO TABS: 0.5000 mg | ORAL_TABLET | Freq: Three times a day (TID) | ORAL | Status: DC | PRN

## 2013-01-07 NOTE — Telephone Encounter (Signed)
Lorazepam refilled per protocol

## 2013-01-07 NOTE — Telephone Encounter (Signed)
OK X1 

## 2013-01-07 NOTE — Telephone Encounter (Signed)
Lorazepam refilled, contract printed. Pt called and message left that script is ready for pick up

## 2013-01-07 NOTE — Telephone Encounter (Signed)
Lorazepam 0.5 mg Last refill: 6.29.2014 Last OV: 5.13.2014 No contract on file

## 2013-03-12 ENCOUNTER — Telehealth: Payer: Self-pay

## 2013-03-12 NOTE — Telephone Encounter (Addendum)
Left message for call back Non identifiable Medication List and allergies:  Reviewed and updated  90 day supply/mail order: na [facility] [place] [employer]  Immunizations due: admin flu vaccine at appt  A/P:   No changes to FH, PSH or Personal Hx Flu vaccine--admin today Tdap--02/2010 [facility]--due PSA--02/2010--0.37  To Discuss with Provider: Not at this time

## 2013-03-13 ENCOUNTER — Ambulatory Visit (INDEPENDENT_AMBULATORY_CARE_PROVIDER_SITE_OTHER): Payer: 59 | Admitting: Internal Medicine

## 2013-03-13 ENCOUNTER — Encounter: Payer: Self-pay | Admitting: Internal Medicine

## 2013-03-13 VITALS — BP 120/71 | HR 65 | Temp 97.4°F | Ht 72.75 in | Wt 175.2 lb

## 2013-03-13 DIAGNOSIS — Z1211 Encounter for screening for malignant neoplasm of colon: Secondary | ICD-10-CM

## 2013-03-13 DIAGNOSIS — Z0000 Encounter for general adult medical examination without abnormal findings: Secondary | ICD-10-CM

## 2013-03-13 DIAGNOSIS — Z23 Encounter for immunization: Secondary | ICD-10-CM

## 2013-03-13 LAB — CBC WITH DIFFERENTIAL/PLATELET
Basophils Absolute: 0 10*3/uL (ref 0.0–0.1)
Basophils Relative: 0.3 % (ref 0.0–3.0)
EOS ABS: 0 10*3/uL (ref 0.0–0.7)
EOS PCT: 0.3 % (ref 0.0–5.0)
HEMATOCRIT: 44.1 % (ref 39.0–52.0)
Hemoglobin: 14.6 g/dL (ref 13.0–17.0)
LYMPHS ABS: 1.5 10*3/uL (ref 0.7–4.0)
Lymphocytes Relative: 22.3 % (ref 12.0–46.0)
MCHC: 33.1 g/dL (ref 30.0–36.0)
MCV: 95.9 fl (ref 78.0–100.0)
MONO ABS: 0.5 10*3/uL (ref 0.1–1.0)
Monocytes Relative: 8.1 % (ref 3.0–12.0)
Neutro Abs: 4.6 10*3/uL (ref 1.4–7.7)
Neutrophils Relative %: 69 % (ref 43.0–77.0)
PLATELETS: 159 10*3/uL (ref 150.0–400.0)
RBC: 4.6 Mil/uL (ref 4.22–5.81)
RDW: 12.3 % (ref 11.5–14.6)
WBC: 6.7 10*3/uL (ref 4.5–10.5)

## 2013-03-13 LAB — TSH: TSH: 1.37 u[IU]/mL (ref 0.35–5.50)

## 2013-03-13 LAB — BASIC METABOLIC PANEL
BUN: 12 mg/dL (ref 6–23)
CHLORIDE: 103 meq/L (ref 96–112)
CO2: 30 meq/L (ref 19–32)
Calcium: 9.5 mg/dL (ref 8.4–10.5)
Creatinine, Ser: 0.9 mg/dL (ref 0.4–1.5)
GFR: 93.63 mL/min (ref >60.00)
GLUCOSE: 94 mg/dL (ref 70–99)
POTASSIUM: 3.7 meq/L (ref 3.5–5.1)
Sodium: 139 mEq/L (ref 135–145)

## 2013-03-13 LAB — HEPATIC FUNCTION PANEL
ALT: 24 U/L (ref 0–53)
AST: 28 U/L (ref 0–37)
Albumin: 4.3 g/dL (ref 3.5–5.2)
Alkaline Phosphatase: 50 U/L (ref 39–117)
BILIRUBIN TOTAL: 1 mg/dL (ref 0.3–1.2)
Bilirubin, Direct: 0.1 mg/dL (ref 0.0–0.3)
TOTAL PROTEIN: 7.1 g/dL (ref 6.0–8.3)

## 2013-03-13 LAB — PSA: PSA: 0.43 ng/mL (ref 0.10–4.00)

## 2013-03-13 LAB — LIPID PANEL
CHOL/HDL RATIO: 3
CHOLESTEROL: 216 mg/dL — AB (ref 0–200)
HDL: 81 mg/dL (ref >39.00)
Triglycerides: 43 mg/dL (ref 0.0–149.0)
VLDL: 8.6 mg/dL (ref 0.0–40.0)

## 2013-03-13 LAB — LDL CHOLESTEROL, DIRECT: Direct LDL: 121 mg/dL

## 2013-03-13 MED ORDER — LORAZEPAM 0.5 MG PO TABS
0.5000 mg | ORAL_TABLET | Freq: Three times a day (TID) | ORAL | Status: DC | PRN
Start: 2013-03-13 — End: 2013-12-23

## 2013-03-13 NOTE — Patient Instructions (Signed)
Your next office appointment will be determined based upon review of your pending labs. Those instructions will be transmitted to you through My Chart . 

## 2013-03-13 NOTE — Progress Notes (Signed)
   Subjective:    Patient ID: Mark [name], male    DOB: 07/05/1960, 53 y.o.   MRN: [id number]  HPI  He is here for a physical;acute issues denied.     Review of Systems [facility].He denies dyspepsia, dysphagia, unexplained weight loss, abdominal pain, melena, rectal bleeding, or small caliber stools.     Objective:   Physical Exam Gen.: Thin but healthy and well-nourished in appearance. Alert, appropriate and cooperative throughout exam.Appears younger than stated age  Head: Normocephalic without obvious abnormalities; goatee  Eyes: No corneal or conjunctival inflammation noted. Pupils equal round reactive to light and accommodation. Extraocular motion intact.  Ears: External  ear exam reveals no significant lesions or deformities. Some wax bilaterally. Hearing is grossly normal bilaterally. Nose: External nasal exam reveals no deformity or inflammation. Nasal mucosa are pink and moist. No lesions or exudates noted. Septum  To R.  Mouth: Oral mucosa and oropharynx reveal no lesions or exudates. Teeth in good repair. Neck: No deformities, masses, or tenderness noted. Range of motion &Thyroid normal. Lungs: Normal respiratory effort; chest expands symmetrically. Lungs are clear to auscultation without rales, wheezes, or increased work of breathing. Heart: Normal rate and rhythm. Normal S1 and S2. No gallop, click, or rub. S4 w/o murmur. Abdomen: Bowel sounds normal; abdomen soft and nontender. No masses, organomegaly or hernias noted. Genitalia: Genitalia normal except for left varices. Prostate is normal without enlargement, asymmetry, nodularity, or induration                                    Musculoskeletal/extremities: No deformity or scoliosis noted of  the thoracic or lumbar spine.  No clubbing, cyanosis, edema, or significant extremity  deformity noted. Range of motion normal .Tone & strength normal. Hand joints normal . Fingernail health good. Able to lie down  & sit up w/o help. Negative SLR bilaterally Vascular: Carotid, radial artery, dorsalis pedis and  posterior tibial pulses are full and equal. No bruits present. Neurologic: Alert and oriented x3. Deep tendon reflexes symmetrical and normal.        Skin: Intact without suspicious lesions or rashes. Lymph: No cervical, axillary lymphadenopathy present. Psych: Mood and affect are normal. Normally interactive                                                                                        Assessment & Plan:  #1 comprehensive physical exam; no acute findings  Plan: see Orders  & Recommendations

## 2013-03-13 NOTE — Progress Notes (Signed)
Pre visit review using our clinic review tool, if applicable. No additional management support is needed unless otherwise documented below in the visit note. 

## 2013-03-17 ENCOUNTER — Encounter: Payer: Self-pay | Admitting: *Deleted

## 2013-04-15 ENCOUNTER — Encounter: Payer: Self-pay | Admitting: Internal Medicine

## 2013-05-05 ENCOUNTER — Ambulatory Visit (AMBULATORY_SURGERY_CENTER): Payer: Self-pay | Admitting: *Deleted

## 2013-05-05 VITALS — Ht 72.75 in | Wt 176.8 lb

## 2013-05-05 DIAGNOSIS — Z1211 Encounter for screening for malignant neoplasm of colon: Secondary | ICD-10-CM

## 2013-05-05 MED ORDER — MOVIPREP 100 G PO SOLR: ORAL | Status: DC

## 2013-05-05 NOTE — Progress Notes (Signed)
Patient denies any allergies to eggs or soy. Patient denies any problems with anesthesia.  

## 2013-05-14 HISTORY — PX: COLONOSCOPY: SHX174

## 2013-05-15 ENCOUNTER — Encounter: Payer: Self-pay | Admitting: Internal Medicine

## 2013-05-19 ENCOUNTER — Ambulatory Visit (AMBULATORY_SURGERY_CENTER): Payer: 59 | Admitting: Internal Medicine

## 2013-05-19 ENCOUNTER — Encounter: Payer: Self-pay | Admitting: Internal Medicine

## 2013-05-19 VITALS — BP 109/78 | HR 47 | Temp 97.6°F | Resp 14 | Wt 176.0 lb

## 2013-05-19 DIAGNOSIS — Z1211 Encounter for screening for malignant neoplasm of colon: Secondary | ICD-10-CM

## 2013-05-19 MED ORDER — SODIUM CHLORIDE 0.9 % IV SOLN: 500.0000 mL | INTRAVENOUS | Status: DC

## 2013-05-19 NOTE — Progress Notes (Signed)
Procedure ends, to recovery, report given and VSS. 

## 2013-05-19 NOTE — Patient Instructions (Signed)
YOU HAD AN ENDOSCOPIC PROCEDURE TODAY AT THE [facility]: Refer to the procedure report that was given to you for any specific questions about what was found during the examination.  If the procedure report does not answer your questions, please call your gastroenterologist to clarify.  If you requested that your care partner not be given the details of your procedure findings, then the procedure report has been included in a sealed envelope for you to review at your convenience later.  YOU SHOULD EXPECT: Some feelings of bloating in the abdomen. Passage of more gas than usual.  Walking can help get rid of the air that was put into your GI tract during the procedure and reduce the bloating. If you had a lower endoscopy (such as a colonoscopy or flexible sigmoidoscopy) you may notice spotting of blood in your stool or on the toilet paper. If you underwent a bowel prep for your procedure, then you may not have a normal bowel movement for a few days.  DIET: Your first meal following the procedure should be a light meal and then it is ok to progress to your normal diet.  A half-sandwich or bowl of soup is an example of a good first meal.  Heavy or fried foods are harder to digest and may make you feel nauseous or bloated.  Likewise meals heavy in dairy and vegetables can cause extra gas to form and this can also increase the bloating.  Drink plenty of fluids but you should avoid alcoholic beverages for 24 hours.  ACTIVITY: Your care partner should take you home directly after the procedure.  You should plan to take it easy, moving slowly for the rest of the day.  You can resume normal activity the day after the procedure however you should NOT DRIVE or use heavy machinery for 24 hours (because of the sedation medicines used during the test).    SYMPTOMS TO REPORT IMMEDIATELY: A gastroenterologist can be reached at any hour.  During normal business hours, 8:30 AM to 5:00 PM Monday through Friday,  call [phone].  After hours and on weekends, please call the GI answering service at [phone] who will take a message and have the physician on call contact you.   Following lower endoscopy (colonoscopy or flexible sigmoidoscopy):  Excessive amounts of blood in the stool  Significant tenderness or worsening of abdominal pains  Swelling of the abdomen that is new, acute  Fever of 100F or higher    FOLLOW UP: If any biopsies were taken you will be contacted by phone or by letter within the next 1-3 weeks.  Call your gastroenterologist if you have not heard about the biopsies in 3 weeks.  Our staff will call the home number listed on your records the next business day following your procedure to check on you and address any questions or concerns that you may have at that time regarding the information given to you following your procedure. This is a courtesy call and so if there is no answer at the home number and we have not heard from you through the emergency physician on call, we will assume that you have returned to your regular daily activities without incident.  SIGNATURES/CONFIDENTIALITY: You and/or your care partner have signed paperwork which will be entered into your electronic medical record.  These signatures attest to the fact that that the information above on your After Visit Summary has been reviewed and is understood.  Full responsibility of the confidentiality   of this discharge information lies with you and/or your care-partner.     

## 2013-05-19 NOTE — Op Note (Signed)
[facility] [address] [place], [id number]   COLONOSCOPY PROCEDURE REPORT  PATIENT: Mark DroughtGarner, [name]  MR#: [id number] BIRTHDATE: 1960-10-14 , 52  yrs. old GENDER: Male ENDOSCOPIST: Roxy CedarJohn N Perry Jr, MD REFERRED XB:[id number]:William [name], M.D. PROCEDURE DATE:  05/19/2013 PROCEDURE:   Colonoscopy, screening First Screening Colonoscopy - Avg.  risk and is 50 yrs.  old or older Yes.  Prior Negative Screening - Now for repeat screening. N/A  History of Adenoma - Now for follow-up colonoscopy & has been > or = to 3 yrs.  N/A  Polyps Removed Today? No.  Recommend repeat exam, <10 yrs? No. ASA CLASS:   Class I INDICATIONS:average risk screening. MEDICATIONS: MAC sedation, administered by CRNA and propofol (Diprivan) 280mg  IV  DESCRIPTION OF PROCEDURE:   After the risks benefits and alternatives of the procedure were thoroughly explained, informed consent was obtained.  A digital rectal exam revealed no abnormalities of the rectum.   The LB [id number]  endoscope was introduced through the anus and advanced to the cecum, which was identified by both the appendix and ileocecal valve. No adverse events experienced.   The quality of the prep was excellent, using MoviPrep  The instrument was then slowly withdrawn as the colon was fully examined.      COLON FINDINGS: A normal appearing cecum, ileocecal valve, and appendiceal orifice were identified.  The ascending, hepatic flexure, transverse, splenic flexure, descending, sigmoid colon and rectum appeared unremarkable.  No polyps or cancers were seen. Retroflexed views revealed no abnormalities. The time to cecum=2 minutes 41 seconds.  Withdrawal time=9 minutes 35 seconds.  The scope was withdrawn and the procedure completed. COMPLICATIONS: There were no complications.  ENDOSCOPIC IMPRESSION: Normal colon  RECOMMENDATIONS: Continue current colorectal screening recommendations for "routine risk" patients with a repeat  colonoscopy in 10 years.   eSigned:  Roxy CedarJohn N Perry Jr, MD 05/19/2013 2:31 PM   cc: Pecola LawlessWilliam F Hopper, MD and The Patient

## 2013-05-20 ENCOUNTER — Telehealth: Payer: Self-pay

## 2013-05-20 NOTE — Telephone Encounter (Signed)
Left a message at # [phone] to please call if any questions or concerns. Maw

## 2013-05-23 ENCOUNTER — Encounter: Payer: Self-pay | Admitting: Internal Medicine

## 2013-06-20 ENCOUNTER — Ambulatory Visit (INDEPENDENT_AMBULATORY_CARE_PROVIDER_SITE_OTHER): Payer: 59 | Admitting: Family Medicine

## 2013-06-20 ENCOUNTER — Other Ambulatory Visit (INDEPENDENT_AMBULATORY_CARE_PROVIDER_SITE_OTHER): Payer: 59

## 2013-06-20 ENCOUNTER — Encounter: Payer: Self-pay | Admitting: Family Medicine

## 2013-06-20 VITALS — BP 110/80 | HR 49 | Wt 176.0 lb

## 2013-06-20 DIAGNOSIS — R0781 Pleurodynia: Secondary | ICD-10-CM

## 2013-06-20 DIAGNOSIS — R079 Chest pain, unspecified: Secondary | ICD-10-CM

## 2013-06-20 DIAGNOSIS — S2341XA Sprain of ribs, initial encounter: Secondary | ICD-10-CM

## 2013-06-20 DIAGNOSIS — S29019A Strain of muscle and tendon of unspecified wall of thorax, initial encounter: Secondary | ICD-10-CM | POA: Insufficient documentation

## 2013-06-20 NOTE — Patient Instructions (Signed)
Good to meet you Ice 20 minutes 2 times a day Pennsaid twice daily for next week at least Avoid significant twisting or overhead movements.  No rematch for 3 weeks.  See me again in 2 weeks Intercostal muscle tear.

## 2013-06-20 NOTE — Progress Notes (Signed)
  Tawana ScaleZach Keenon Leitzel D.O. [facility] [address] [place], [place] [address] Phone: [phone] Subjective:     CC: right sided rib pain  ZHY:[id number]:Subjective Mark [name] is a 53 y.o. male coming in with complaint of right-sided rib pain. Patient states that this started one week ago after patient was playing around with his 454 year old son and Punch. Patient had a lot of pain but no bruising. Patient states that the pain has started to slowly get better but wanted to be checked out because it continues to give him trouble with even breathing deeply. Patient states it is hard to be comfortable in any type of twisting motion causes pain. Seems to be very localized without any radiation. Patient denies any numbness or weakness of the upper extremity. Patient denies any shortness of breath chest pain with breathing deeply. Patient rates the pain a 7/10 in severity. Patient has been taking ibuprofen twice a day with some moderate relief.     Past medical history, social, surgical and family history all reviewed in electronic medical record.   Review of Systems: No headache, visual changes, nausea, vomiting, diarrhea, constipation, dizziness, abdominal pain, skin rash, fevers, chills, night sweats, weight loss, swollen lymph nodes, body aches, joint swelling, muscle aches, chest pain, shortness of breath, mood changes.   Objective Blood pressure 110/80, pulse 49, weight 176 lb (79.833 kg), SpO2 98.00%.  General: No apparent distress alert and oriented x3 mood and affect normal, dressed appropriately.  HEENT: Pupils equal, extraocular movements intact  Respiratory: Patient's speak in full sentences and does not appear short of breath  Cardiovascular: No lower extremity edema, non tender, no erythema  Skin: Warm dry intact with no signs of infection or rash on extremities or on axial skeleton.  Abdomen: Soft nontender  Neuro: Cranial nerves II through XII are intact, neurovascularly intact in all  extremities with 2+ DTRs and 2+ pulses.  Lymph: No lymphadenopathy of posterior or anterior cervical chain or axillae bilaterally.  Gait normal with good balance and coordination.  MSK:  Non tender with full range of motion and good stability and symmetric strength and tone of shoulders, elbows, wrist, hip, knee and ankles bilaterally.  Rib exam shows the patient does have tenderness over the rib angle and anteriorly on the T9 on the right side. Patient does have some capillary petechiae breaks of the skin in this area as well.  Limited musculoskeletal ultrasound was performed and interpreted by Judi SaaZachary M Josiah Wojtaszek Ultrasound of the rib shows the patient does not have any cortical defect the patient does have a tear in the intercostal area between T8-9 on the anterior lateral aspect of the ribs. There is increased hypoechoic changes and patient does have increased upper flow.  Impression: Intercostal muscle injury   Impression and Recommendations:     This case required medical decision making of moderate complexity.

## 2013-06-20 NOTE — Assessment & Plan Note (Signed)
Patient is a intercostal muscle strain or tear noted. Patient will try topical anti-inflammatory was given a trial today. We discussed icing protocol. Patient was given an Ace wrap to try to splint area. Patient come back in 2 weeks for further evaluation. He continues to have pain we'll consider x-ray and further treatment options including nitroglycerin.

## 2013-07-08 ENCOUNTER — Ambulatory Visit (INDEPENDENT_AMBULATORY_CARE_PROVIDER_SITE_OTHER): Payer: 59 | Admitting: Internal Medicine

## 2013-07-08 ENCOUNTER — Encounter: Payer: Self-pay | Admitting: Internal Medicine

## 2013-07-08 VITALS — BP 106/70 | HR 59 | Temp 98.5°F | Ht 73.0 in | Wt 177.5 lb

## 2013-07-08 DIAGNOSIS — L259 Unspecified contact dermatitis, unspecified cause: Secondary | ICD-10-CM | POA: Insufficient documentation

## 2013-07-08 MED ORDER — METHYLPREDNISOLONE ACETATE 80 MG/ML IJ SUSP
80.0000 mg | Freq: Once | INTRAMUSCULAR | Status: AC
  Administered 2013-07-08: 80 mg via INTRAMUSCULAR

## 2013-07-08 MED ORDER — TRIAMCINOLONE ACETONIDE 0.1 % EX CREA: 1.0000 "application " | TOPICAL_CREAM | Freq: Two times a day (BID) | CUTANEOUS | Status: DC

## 2013-07-08 NOTE — Progress Notes (Signed)
   Subjective:    Patient ID: Mark [name], male    DOB: May 25, 1960, 53 y.o.   MRN: [id number]  HPI  Here with c/o itchy rash to arms after working outside, no pain, fever, trauma or swelling.  Has happened in past and steroid shot helps.  Benadryl only helps itching somewhat. Past Medical History  Diagnosis Date  . Tinnitus     w/o hearing loss  . GERD (gastroesophageal reflux disease)   . Fasting hyperglycemia      PMH of  . Family history of prostate cancer      DRE WNL; PSA never > 0.38   Past Surgical History  Procedure Laterality Date  . Tonsillectomy and adenoidectomy    . Vasectomy      reports that he has never smoked. He has never used smokeless tobacco. He reports that he drinks alcohol. He reports that he does not use illicit drugs. family history includes Breast cancer in his sister; COPD in his mother and paternal aunt; Prostate cancer (age of onset: 55) in his father; Stroke in his maternal grandfather. There is no history of Heart disease, Colon cancer, Esophageal cancer, Pancreatic cancer, Rectal cancer, or Stomach cancer. Allergies  Allergen Reactions  . Penicillins     ? Reaction @ age 40   Current Outpatient Prescriptions on File Prior to Visit  Medication Sig Dispense Refill  . LORazepam (ATIVAN) 0.5 MG tablet Take 1 tablet (0.5 mg total) by mouth every 8 (eight) hours as needed for anxiety.  30 tablet  2   No current facility-administered medications on file prior to visit.   Review of Systems All otherwise neg per pt     Objective:   Physical Exam BP 106/70  Pulse 59  Temp(Src) 98.5 F (36.9 C) (Oral)  Ht 6\' 1"  (1.854 m)  Wt 177 lb 8 oz (80.513 kg)  BMI 23.42 kg/m2  SpO2 97% VS noted,  Constitutional: Pt appears well-developed, well-nourished.  HENT: Head: NCAT.  Right Ear: External ear normal.  Left Ear: External ear normal.  Eyes: . Pupils are equal, round, and reactive to light. Conjunctivae and EOM are normal Neck: Normal range of motion.  Neck supple.  Cardiovascular: Normal rate and regular rhythm.   Pulmonary/Chest: Effort normal and breath sounds normal.  Neurological: Pt is alert. Not confused , motor grossly intact Skin: with numerous areas of linear erythema to arms, slight weepiness, non tender Psychiatric: Pt behavior is normal. No agitation.     Assessment & Plan:

## 2013-07-08 NOTE — Patient Instructions (Signed)
You had the steroid shot today  Please take all new medication as prescribed  Please continue all other medications as before, and refills have been done if requested.  Please have the pharmacy call with any other refills you may need.

## 2013-07-08 NOTE — Progress Notes (Signed)
Pre visit review using our clinic review tool, if applicable. No additional management support is needed unless otherwise documented below in the visit note. 

## 2013-07-08 NOTE — Assessment & Plan Note (Signed)
Mild to mod, for depomedrol IM, then triam cr prn,  to f/u any worsening symptoms or concerns

## 2013-10-08 ENCOUNTER — Ambulatory Visit (INDEPENDENT_AMBULATORY_CARE_PROVIDER_SITE_OTHER): Payer: 59 | Admitting: Internal Medicine

## 2013-10-08 ENCOUNTER — Encounter: Payer: Self-pay | Admitting: Internal Medicine

## 2013-10-08 VITALS — BP 110/86 | HR 72 | Temp 98.1°F | Wt 174.0 lb

## 2013-10-08 DIAGNOSIS — L255 Unspecified contact dermatitis due to plants, except food: Secondary | ICD-10-CM

## 2013-10-08 DIAGNOSIS — L237 Allergic contact dermatitis due to plants, except food: Secondary | ICD-10-CM

## 2013-10-08 MED ORDER — HYDROXYZINE HCL 10 MG PO TABS: 10.0000 mg | ORAL_TABLET | Freq: Three times a day (TID) | ORAL | Status: DC | PRN

## 2013-10-08 MED ORDER — MOMETASONE FUROATE 0.1 % EX OINT: TOPICAL_OINTMENT | Freq: Two times a day (BID) | CUTANEOUS | Status: DC

## 2013-10-08 NOTE — Progress Notes (Signed)
Pre visit review using our clinic review tool, if applicable. No additional management support is needed unless otherwise documented below in the visit note. 

## 2013-10-08 NOTE — Patient Instructions (Addendum)
   If you could possibly have been exposed to poison ivy; immediately shower & use soap liberally to remove oil  Dip gauze in  sterile saline and applied to the rash twice a day.  The saline can be purchased at the drugstore or you can make your own .Boil cup of salt in a gallon of water. Store mixture  in a clean container. Apply ointment after gauze removed. Report Warning  signs as discussed (red streaks, pus, fever, increasing pain).

## 2013-10-08 NOTE — Progress Notes (Signed)
   Subjective:    Patient ID: Mark [name], male    DOB: [birth year], 53 y.o.   MRN: [id number]  HPI  He worked in the yard 10/04/13 and developed a rash over the left medial ankle area as of 8/23 in the evening. It is intensely pruritic and has been progressive. He has not treated this.  He has a history of contact dermatitis with poison ivy in the past      Review of Systems   No associated itchy, watery eyes.  Swelling of the lips or tongue denied.  Shortness of breath, wheezing, or cough absent.  No rash or urticaria noted.  Fever ,chills , or sweats denied. Purulence absent.  Diarrhea not present.      Objective:   Physical Exam  There is area of raised erythematous lesions in a linear, vertical distribution measuring 15.5 x 3.5 cm of the left medial ankle area. There is a central eschar.    General appearance:good health ;thin but well nourished; no acute distress or increased work of breathing is present.  No  lymphadenopathy about the head, neck, or axilla noted.  Eyes: No conjunctival inflammation or lid edema is present. There is no scleral icterus. Nose:  External nasal examination shows no deformity or inflammation. Nasal mucosa are pink and moist without lesions or exudates. No septal dislocation or deviation.No obstruction to airflow.  Oral exam: Dental hygiene is good; lips and gums are healthy appearing.There is no oropharyngeal erythema or exudate noted.  Neck:  No deformities, thyromegaly, masses, or tenderness noted.   Supple with full range of motion without pain.  Heart:  Normal rate and regular rhythm. S1 and S2 normal without gallop, murmur, click, rub or other extra sounds.  Lungs:Chest clear to auscultation; no wheezes, rhonchi,rales ,or rubs present.No increased work of breathing.   Extremities:  No cyanosis, edema, or clubbing  noted  Skin: Warm & dry w/o jaundice or tenting otherwise.        Assessment & Plan:  #1 poison ivy contact  dermatitis  Plan: See orders and recommendations

## 2013-12-23 ENCOUNTER — Other Ambulatory Visit: Payer: Self-pay

## 2013-12-24 MED ORDER — LORAZEPAM 0.5 MG PO TABS: 0.5000 mg | ORAL_TABLET | Freq: Three times a day (TID) | ORAL | Status: DC | PRN

## 2013-12-24 NOTE — Telephone Encounter (Signed)
[facility]orazepam [facility] has been fax[facility] [place]

## 2013-12-24 NOTE — Telephone Encounter (Signed)
OK X1 

## 2014-02-16 ENCOUNTER — Other Ambulatory Visit: Payer: Self-pay | Admitting: Internal Medicine

## 2014-02-17 NOTE — Telephone Encounter (Signed)
OK #30 

## 2014-02-17 NOTE — Telephone Encounter (Signed)
[facility] been [facility]

## 2014-03-16 ENCOUNTER — Encounter: Payer: Self-pay | Admitting: Internal Medicine

## 2014-03-16 ENCOUNTER — Ambulatory Visit (INDEPENDENT_AMBULATORY_CARE_PROVIDER_SITE_OTHER): Payer: 59 | Admitting: Internal Medicine

## 2014-03-16 VITALS — BP 118/82 | HR 57 | Temp 98.1°F | Resp 15 | Ht 73.0 in | Wt 177.5 lb

## 2014-03-16 DIAGNOSIS — Z0189 Encounter for other specified special examinations: Secondary | ICD-10-CM

## 2014-03-16 DIAGNOSIS — Z0000 Encounter for general adult medical examination without abnormal findings: Secondary | ICD-10-CM

## 2014-03-16 MED ORDER — LORAZEPAM 0.5 MG PO TABS: ORAL_TABLET | ORAL | Status: DC

## 2014-03-16 NOTE — Progress Notes (Signed)
Subjective:    Patient ID: Mark [name], male    DOB: 1960/08/29, 54 y.o.   MRN: [id number]  HPI  He is here for a physical;acute issues addressed below.  He runs > 3 mpd 4 days/ week. He has no associated cardiopulmonary symptoms with that high level of exercise  He does have some dyspepsia with triggers of stress, coffee, or spicy foods. He also has had rare nocturnal dyspnea which he attributed to eating late or due to the content of his late meal  Unfortunately he lost his mom December to COPD.  Anxieties related to travel; typically he will travel 2 times a month. He is taking lorazepam on average twice a month.  Review of Systems   Chest pain, palpitations, tachycardia, exertional dyspnea,  claudication or edema are absent.  Unexplained weight loss, abdominal pain, significant dyspepsia, dysphagia, melena, rectal bleeding, or persistently small caliber stools are denied.     Objective:   Physical Exam  Gen.: Thin but adequately nourished in appearance. Alert, appropriate and cooperative throughout exam.  Appears younger than stated age  Head: Normocephalic without obvious abnormalities;  goatee  Eyes: No corneal or conjunctival inflammation noted. Pupils equal round reactive to light and accommodation. Extraocular motion intact.  Ears: External  ear exam reveals no significant lesions or deformities. Canals clear .TMs normal. Hearing is grossly normal bilaterally. Nose: External nasal exam reveals no deformity or inflammation. Nasal mucosa are pink and moist. No lesions or exudates noted.   Mouth: Oral mucosa and oropharynx reveal no lesions or exudates. Teeth in good repair. Neck: No deformities, masses, or tenderness noted. Range of motion & Thyroid normal. Lungs: barrel chested.Normal respiratory effort; chest expands symmetrically. Lungs are clear to auscultation without rales, wheezes, or increased work of breathing. Heart: Slow rate and regular rhythm. Normal S1 and  S2. No gallop, click, or rub. No murmur. Abdomen: Bowel sounds normal; abdomen soft and nontender. No masses, organomegaly or hernias noted. Genitalia: Genitalia normal except for left varices. Prostate is normal without enlargement, asymmetry, nodularity, or induration                                 Musculoskeletal/extremities: No deformity or scoliosis noted of  the thoracic or lumbar spine.  No clubbing, cyanosis, edema, or significant extremity  deformity noted.  Range of motion normal . Tone & strength normal. Hand joints normal  Fingernail  health good. Able to lie down & sit up w/o help.  Negative SLR bilaterally Vascular: Carotid, radial artery, dorsalis pedis and  posterior tibial pulses are full and equal. No bruits present. Neurologic: Alert and oriented x3. Deep tendon reflexes symmetrical and normal.  Gait normal       Skin: Intact without suspicious lesions or rashes. Lymph: No cervical, axillary, or inguinal lymphadenopathy present. Psych: Mood and affect are normal. Normally interactive                                                                                      Assessment & Plan:  #1 comprehensive physical exam; no acute findings  Plan: see  Orders  & Recommendations

## 2014-03-16 NOTE — Progress Notes (Signed)
Pre visit review using our clinic review tool, if applicable. No additional management support is needed unless otherwise documented below in the visit note. 

## 2014-03-16 NOTE — Patient Instructions (Signed)
Your next office appointment will be determined based upon review of your pending labs . Those instructions will be transmitted to you through My Chart  OR  by mail;whichever process is your choice to receive results & recommendations . Critical values will be called  Reflux of gastric acid may be asymptomatic as this may occur mainly during sleep.The triggers for reflux  include stress; the "aspirin family" ; alcohol; peppermint; and caffeine (coffee, tea, cola, and chocolate). The aspirin family would include aspirin and the nonsteroidal agents such as ibuprofen &  Naproxen. Tylenol would not cause reflux. If having symptoms ; food & drink should be avoided for @ least 2 hours before going to bed.

## 2014-03-30 ENCOUNTER — Ambulatory Visit (INDEPENDENT_AMBULATORY_CARE_PROVIDER_SITE_OTHER): Payer: 59 | Admitting: Medical

## 2014-03-30 ENCOUNTER — Encounter: Payer: Self-pay | Admitting: Medical

## 2014-03-30 VITALS — BP 84/60 | HR 85 | Temp 98.6°F | Resp 16 | Ht 72.75 in | Wt 178.0 lb

## 2014-03-30 DIAGNOSIS — R5383 Other fatigue: Secondary | ICD-10-CM

## 2014-03-30 DIAGNOSIS — J029 Acute pharyngitis, unspecified: Secondary | ICD-10-CM | POA: Insufficient documentation

## 2014-03-30 LAB — CBC WITH DIFFERENTIAL/PLATELET
BASOS PCT: 0 % (ref 0–1)
Basophils Absolute: 0 10*3/uL (ref 0.0–0.1)
Eosinophils Absolute: 0.1 10*3/uL (ref 0.0–0.7)
Eosinophils Relative: 1 % (ref 0–5)
HCT: 38.4 % — ABNORMAL LOW (ref 39.0–52.0)
Hemoglobin: 13.2 g/dL (ref 13.0–17.0)
LYMPHS ABS: 1.1 10*3/uL (ref 0.7–4.0)
LYMPHS PCT: 19 % (ref 12–46)
MCH: 32.1 pg (ref 26.0–34.0)
MCHC: 34.4 g/dL (ref 30.0–36.0)
MCV: 93.4 fL (ref 78.0–100.0)
MONOS PCT: 20 % — AB (ref 3–12)
MPV: 9.6 fL (ref 8.6–12.4)
Monocytes Absolute: 1.2 10*3/uL — ABNORMAL HIGH (ref 0.1–1.0)
NEUTROS ABS: 3.5 10*3/uL (ref 1.7–7.7)
NEUTROS PCT: 60 % (ref 43–77)
Platelets: 141 10*3/uL — ABNORMAL LOW (ref 150–400)
RBC: 4.11 MIL/uL — AB (ref 4.22–5.81)
RDW: 12.7 % (ref 11.5–15.5)
WBC: 5.9 10*3/uL (ref 4.0–10.5)

## 2014-03-30 LAB — POCT RAPID STREP A (OFFICE): RAPID STREP A SCREEN: NEGATIVE

## 2014-03-30 MED ORDER — AZITHROMYCIN 250 MG PO TABS: ORAL_TABLET | ORAL | Status: DC

## 2014-03-30 MED ORDER — FLUTICASONE PROPIONATE 50 MCG/ACT NA SUSP: 2.0000 | Freq: Every day | NASAL | Status: DC

## 2014-03-30 MED ORDER — BENZONATATE 100 MG PO CAPS: 100.0000 mg | ORAL_CAPSULE | Freq: Three times a day (TID) | ORAL | Status: DC | PRN

## 2014-03-30 NOTE — Progress Notes (Signed)
Pre visit review using our clinic review tool, if applicable. No additional management support is needed unless otherwise documented below in the visit note. 

## 2014-03-30 NOTE — Addendum Note (Signed)
Addended by: Eustace QuailEABOLD, Mohit Zirbes J on: 03/30/2014 01:12 PM   Modules accepted: Orders

## 2014-03-30 NOTE — Assessment & Plan Note (Signed)
Your strep test was negative. However, your physical exam and clinical presentation is suspicious for strep and it is important to note that rapid strep test can be falsely negative. So I am going to give you a antibiotic today based on your exam and clinical presentation.  Rest hydrate, tylenol for fever, and warm salt water gargles.    For the nasal congestion and cough, I am prescirbing flonase + benzonatate. Follow up in 7 days or as needed.

## 2014-03-30 NOTE — Progress Notes (Signed)
Subjective:    Patient ID: Mark [name], male    DOB: 06/21/1960, 54 y.o.   MRN: [id number]  HPI   Pt in with sore throat. Moderate pain. Dull pain. No known sick contact. Faint mild body aches last wed. Pt states symptoms other than st are  cough, congestion and runny nose x 1 wk. St was worse/more on  on Friday.   Sinus pressure some. No ear pain.     Review of Systems  Constitutional: Negative for fever, chills and fatigue.  HENT: Positive for congestion, rhinorrhea and sore throat. Negative for sinus pressure, sneezing and trouble swallowing.   Respiratory: Positive for cough. Negative for choking and wheezing.   Cardiovascular: Negative for chest pain and palpitations.  Musculoskeletal: Negative for back pain.  Neurological: Negative for facial asymmetry, speech difficulty, numbness and headaches.  Psychiatric/Behavioral: Negative for behavioral problems.   Past Medical History  Diagnosis Date  . Tinnitus     w/o hearing loss  . GERD (gastroesophageal reflux disease)   . Fasting hyperglycemia      PMH of  . Family history of prostate cancer      DRE WNL; PSA never > 0.38    History   Social History  . Marital Status: Married    Spouse Name: N/A  . Number of Children: N/A  . Years of Education: N/A   Occupational History  . Not on file.   Social History Main Topics  . Smoking status: Never Smoker   . Smokeless tobacco: Never Used  . Alcohol Use: Yes     Comment: very rare wine , < one per month per pt.  . Drug Use: No  . Sexual Activity: Not on file   Other Topics Concern  . Not on file   Social History Narrative    Past Surgical History  Procedure Laterality Date  . Tonsillectomy and adenoidectomy    . Vasectomy    . Colonoscopy  05/2013    Neg, Dr [name]    Family History  Problem Relation Age of Onset  . COPD Mother     smoker  . Prostate cancer Father 7070  . Breast cancer Sister   . COPD Paternal Aunt     smoker  . Stroke Maternal  Grandfather      late 6360s  . Heart disease Neg Hx   . Colon cancer Neg Hx   . Esophageal cancer Neg Hx   . Pancreatic cancer Neg Hx   . Rectal cancer Neg Hx   . Stomach cancer Neg Hx   . Diabetes Neg Hx     Allergies  Allergen Reactions  . Penicillins     ? Reaction @ age 89    Current Outpatient Prescriptions on File Prior to Visit  Medication Sig Dispense Refill  . LORazepam (ATIVAN) 0.5 MG tablet TAKE 1 TABLET BY MOUTH daily  ONLY AS NEEDED FOR ANXIETY 30 tablet 0   No current facility-administered medications on file prior to visit.    BP 84/60 mmHg  Pulse 85  Temp(Src) 98.6 F (37 C) (Oral)  Resp 16  Ht 6' 0.75" (1.848 m)  Wt 178 lb (80.74 kg)  BMI 23.64 kg/m2  SpO2 98%       Objective:   Physical Exam  General  Mental Status - Alert. General Appearance - Well groomed. Not in acute distress.  Skin Rashes- No Rashes.  HEENT Head- Normal. Ear Auditory Canal - Left- Normal. Right - Normal.Tympanic Membrane- Left- Normal.  Right- Normal. Eye Sclera/Conjunctiva- Left- Normal. Right- Normal. Nose & Sinuses Nasal Mucosa- Left-   boggy +  Congested. Right-   boggy + Congested. No sinus pressure Mouth & Throat Lips: Upper Lip- Normal: no dryness, cracking, pallor, cyanosis, or vesicular eruption. Lower Lip-Normal: no dryness, cracking, pallor, cyanosis or vesicular eruption. Buccal Mucosa- Bilateral- No Aphthous ulcers. Oropharynx- No Discharge but moderate bright Erythema. Tonsils: Characteristics- Bilateral- Moderate bright Erythema + Congestion. Size/Enlargement- Bilateral- 1+enlargement. Discharge- bilateral-None.  Neck Neck- Supple. No Masses.   Chest and Lung Exam Auscultation: Breath Sounds:- even and unlabored, but bilateral upper lobe rhonchi.  Cardiovascular Auscultation:Rythm- Regular, rate and rhythm. Murmurs & Other Heart Sounds:Ausculatation of the heart reveal- No Murmurs.  Lymphatic Head & Neck General Head & Neck  Lymphatics: Bilateral: Description-  Localized lymphadenopathy, bilateral submandibular nodes enlarged. Another anterior cervicalnode enlarge on rt side as well. (below submandibular node)       Assessment & Plan:

## 2014-03-30 NOTE — Patient Instructions (Addendum)
Acute pharyngitis Your strep test was negative. However, your physical exam and clinical presentation is suspicious for strep and it is important to note that rapid strep test can be falsely negative. So I am going to give you a antibiotic today based on your exam and clinical presentation.  Rest hydrate, tylenol for fever, and warm salt water gargles.    For the nasal congestion and cough, I am prescirbing flonase + benzonatate. Follow up in 7 days or as needed.      I noticed his bp was low today. He is fatigued. So they will come back today and get cbc. Will check hb/hct and wbc.

## 2014-05-19 ENCOUNTER — Other Ambulatory Visit: Payer: Self-pay | Admitting: Internal Medicine

## 2014-05-19 NOTE — Telephone Encounter (Signed)
Lorazepam has been called [facility] [place]

## 2014-05-19 NOTE — Telephone Encounter (Signed)
OK x 1 

## 2014-07-21 ENCOUNTER — Telehealth: Payer: Self-pay

## 2014-07-21 ENCOUNTER — Other Ambulatory Visit: Payer: Self-pay | Admitting: Internal Medicine

## 2014-07-21 NOTE — Telephone Encounter (Signed)
OK X1 

## 2014-07-21 NOTE — Telephone Encounter (Signed)
r u ok with refilling?--please advise, thanks

## 2014-07-21 NOTE — Telephone Encounter (Signed)
Ativan rx sent to pharm 

## 2014-07-21 NOTE — Telephone Encounter (Signed)
You ok with refilling?---please advise, thanks

## 2014-11-30 ENCOUNTER — Other Ambulatory Visit: Payer: Self-pay | Admitting: Internal Medicine

## 2014-11-30 NOTE — Telephone Encounter (Signed)
OK X1 During October, November and December I shall be in the office limited periods of time. To guarantee continuity of care such as ongoing prescription refills and lab updates ; you should establish with a new primary care provider. I recommend Dr Stacy Burns who is replacing me here @ [facility].She is an outstanding clinician. Hopp 

## 2014-11-30 NOTE — Telephone Encounter (Signed)
RX has been faxed to [facility]

## 2014-11-30 NOTE — Telephone Encounter (Signed)
Last OV 2/16, last refill 07/17/14 . Please advise

## 2015-03-11 ENCOUNTER — Telehealth: Payer: Self-pay | Admitting: Internal Medicine

## 2015-03-11 MED ORDER — LORAZEPAM 0.5 MG PO TABS: ORAL_TABLET | ORAL | Status: DC

## 2015-03-11 NOTE — Telephone Encounter (Signed)
Notified pt rx fax to [facility],,,/lmb

## 2015-03-11 NOTE — Telephone Encounter (Signed)
Done hardcopy to Corinne  

## 2015-03-11 NOTE — Telephone Encounter (Signed)
Former pt of Dr. Alwyn Ren pls advise on msg below...Mark [name]

## 2015-03-11 NOTE — Telephone Encounter (Signed)
Pt called and is needing a prescription for LORazepam (ATIVAN) 0.5 MG tablet [[id number]. He has an appointment coming up with [place] office to transfer. [facility] [address]

## 2015-03-11 NOTE — Telephone Encounter (Signed)
Medication printed signed and given to lucy

## 2015-03-24 ENCOUNTER — Encounter: Payer: Self-pay | Admitting: Medical

## 2015-03-24 ENCOUNTER — Ambulatory Visit (INDEPENDENT_AMBULATORY_CARE_PROVIDER_SITE_OTHER): Payer: 59 | Admitting: Medical

## 2015-03-24 VITALS — BP 112/64 | HR 64 | Temp 97.8°F | Ht 72.75 in | Wt 171.4 lb

## 2015-03-24 DIAGNOSIS — Z0189 Encounter for other specified special examinations: Secondary | ICD-10-CM

## 2015-03-24 DIAGNOSIS — Z113 Encounter for screening for infections with a predominantly sexual mode of transmission: Secondary | ICD-10-CM

## 2015-03-24 DIAGNOSIS — Z0000 Encounter for general adult medical examination without abnormal findings: Secondary | ICD-10-CM

## 2015-03-24 LAB — LIPID PANEL
Cholesterol: 207 mg/dL — ABNORMAL HIGH (ref 0–200)
HDL: 79.8 mg/dL (ref >39.00)
LDL Cholesterol: 117 mg/dL — ABNORMAL HIGH (ref 0–99)
NonHDL: 127.37
TRIGLYCERIDES: 52 mg/dL (ref 0.0–149.0)
Total CHOL/HDL Ratio: 3
VLDL: 10.4 mg/dL (ref 0.0–40.0)

## 2015-03-24 LAB — CBC WITH DIFFERENTIAL/PLATELET
Basophils Absolute: 0 K/uL (ref 0.0–0.1)
Basophils Relative: 0.3 % (ref 0.0–3.0)
Eosinophils Absolute: 0 K/uL (ref 0.0–0.7)
Eosinophils Relative: 1 % (ref 0.0–5.0)
HCT: 43.5 % (ref 39.0–52.0)
Hemoglobin: 14.5 g/dL (ref 13.0–17.0)
Lymphocytes Relative: 28.9 % (ref 12.0–46.0)
Lymphs Abs: 1.4 K/uL (ref 0.7–4.0)
MCHC: 33.4 g/dL (ref 30.0–36.0)
MCV: 94.7 fl (ref 78.0–100.0)
Monocytes Absolute: 0.5 K/uL (ref 0.1–1.0)
Monocytes Relative: 9.9 % (ref 3.0–12.0)
Neutro Abs: 3 K/uL (ref 1.4–7.7)
Neutrophils Relative %: 59.9 % (ref 43.0–77.0)
Platelets: 165 K/uL (ref 150.0–400.0)
RBC: 4.6 Mil/uL (ref 4.22–5.81)
RDW: 12.9 % (ref 11.5–15.5)
WBC: 5 K/uL (ref 4.0–10.5)

## 2015-03-24 LAB — COMPREHENSIVE METABOLIC PANEL
ALK PHOS: 47 U/L (ref 39–117)
ALT: 24 U/L (ref 0–53)
AST: 25 U/L (ref 0–37)
Albumin: 4.2 g/dL (ref 3.5–5.2)
BUN: 15 mg/dL (ref 6–23)
CHLORIDE: 103 meq/L (ref 96–112)
CO2: 31 mEq/L (ref 19–32)
Calcium: 9.5 mg/dL (ref 8.4–10.5)
Creatinine, Ser: 0.97 mg/dL (ref 0.40–1.50)
GFR: 86.28 mL/min (ref >60.00)
GLUCOSE: 101 mg/dL — AB (ref 70–99)
POTASSIUM: 4.1 meq/L (ref 3.5–5.1)
Sodium: 140 mEq/L (ref 135–145)
TOTAL PROTEIN: 6.8 g/dL (ref 6.0–8.3)
Total Bilirubin: 0.9 mg/dL (ref 0.2–1.2)

## 2015-03-24 LAB — POCT URINALYSIS DIPSTICK
Bilirubin, UA: NEGATIVE
Blood, UA: NEGATIVE
GLUCOSE UA: NEGATIVE
Ketones, UA: NEGATIVE
LEUKOCYTES UA: NEGATIVE
Nitrite, UA: NEGATIVE
PROTEIN UA: NEGATIVE
Spec Grav, UA: 1.01
UROBILINOGEN UA: 0.2
pH, UA: 5

## 2015-03-24 LAB — TSH: TSH: 1.54 u[IU]/mL (ref 0.35–4.50)

## 2015-03-24 LAB — PSA: PSA: 0.4 ng/mL (ref 0.10–4.00)

## 2015-03-24 NOTE — Progress Notes (Signed)
Pre visit review using our clinic review tool, if applicable. No additional management support is needed unless otherwise documented below in the visit note. 

## 2015-03-24 NOTE — Patient Instructions (Addendum)
Wellness examination Cbc, cmp, tsh, lipid panel, psa, ua today. You could also ask insurance if they pay for hep c testing and shingles vaccine.     Continue healthy life style/exercise.  We will let you know results when labs are back.   Follow up date to be determined after lab review.  Preventive Care for Adults, Male A healthy lifestyle and preventive care can promote health and wellness. Preventive health guidelines for men include the following key practices:  A routine yearly physical is a good way to check with your health care provider about your health and preventative screening. It is a chance to share any concerns and updates on your health and to receive a thorough exam.  Visit your dentist for a routine exam and preventative care every 6 months. Brush your teeth twice a day and floss once a day. Good oral hygiene prevents tooth decay and gum disease.  The frequency of eye exams is based on your age, health, family medical history, use of contact lenses, and other factors. Follow your health care provider's recommendations for frequency of eye exams.  Eat a healthy diet. Foods such as vegetables, fruits, whole grains, low-fat dairy products, and lean protein foods contain the nutrients you need without too many calories. Decrease your intake of foods high in solid fats, added sugars, and salt. Eat the right amount of calories for you.Get information about a proper diet from your health care provider, if necessary.  Regular physical exercise is one of the most important things you can do for your health. Most adults should get at least 150 minutes of moderate-intensity exercise (any activity that increases your heart rate and causes you to sweat) each week. In addition, most adults need muscle-strengthening exercises on 2 or more days a week.  Maintain a healthy weight. The body mass index (BMI) is a screening tool to identify possible weight problems. It provides an estimate of  body fat based on height and weight. Your health care provider can find your BMI and can help you achieve or maintain a healthy weight.For adults 20 years and older:  A BMI below 18.5 is considered underweight.  A BMI of 18.5 to 24.9 is normal.  A BMI of 25 to 29.9 is considered overweight.  A BMI of 30 and above is considered obese.  Maintain normal blood lipids and cholesterol levels by exercising and minimizing your intake of saturated fat. Eat a balanced diet with plenty of fruit and vegetables. Blood tests for lipids and cholesterol should begin at age 52 and be repeated every 5 years. If your lipid or cholesterol levels are high, you are over 50, or you are at high risk for heart disease, you may need your cholesterol levels checked more frequently.Ongoing high lipid and cholesterol levels should be treated with medicines if diet and exercise are not working.  If you smoke, find out from your health care provider how to quit. If you do not use tobacco, do not start.  Lung cancer screening is recommended for adults aged 45-80 years who are at high risk for developing lung cancer because of a history of smoking. A yearly low-dose CT scan of the lungs is recommended for people who have at least a 30-pack-year history of smoking and are a current smoker or have quit within the past 15 years. A pack year of smoking is smoking an average of 1 pack of cigarettes a day for 1 year (for example: 1 pack a day for 30  years or 2 packs a day for 15 years). Yearly screening should continue until the smoker has stopped smoking for at least 15 years. Yearly screening should be stopped for people who develop a health problem that would prevent them from having lung cancer treatment.  If you choose to drink alcohol, do not have more than 2 drinks per day. One drink is considered to be 12 ounces (355 mL) of beer, 5 ounces (148 mL) of wine, or 1.5 ounces (44 mL) of liquor.  Avoid use of street drugs. Do not  share needles with anyone. Ask for help if you need support or instructions about stopping the use of drugs.  High blood pressure causes heart disease and increases the risk of stroke. Your blood pressure should be checked at least every 1-2 years. Ongoing high blood pressure should be treated with medicines, if weight loss and exercise are not effective.  If you are 55-72 years old, ask your health care provider if you should take aspirin to prevent heart disease.  Diabetes screening is done by taking a blood sample to check your blood glucose level after you have not eaten for a certain period of time (fasting). If you are not overweight and you do not have risk factors for diabetes, you should be screened once every 3 years starting at age 38. If you are overweight or obese and you are 59-45 years of age, you should be screened for diabetes every year as part of your cardiovascular risk assessment.  Colorectal cancer can be detected and often prevented. Most routine colorectal cancer screening begins at the age of 51 and continues through age 75. However, your health care provider may recommend screening at an earlier age if you have risk factors for colon cancer. On a yearly basis, your health care provider may provide home test kits to check for hidden blood in the stool. Use of a small camera at the end of a tube to directly examine the colon (sigmoidoscopy or colonoscopy) can detect the earliest forms of colorectal cancer. Talk to your health care provider about this at age 57, when routine screening begins. Direct exam of the colon should be repeated every 5-10 years through age 17, unless early forms of precancerous polyps or small growths are found.  People who are at an increased risk for hepatitis B should be screened for this virus. You are considered at high risk for hepatitis B if:  You were born in a country where hepatitis B occurs often. Talk with your health care provider about which  countries are considered high risk.  Your parents were born in a high-risk country and you have not received a shot to protect against hepatitis B (hepatitis B vaccine).  You have HIV or AIDS.  You use needles to inject street drugs.  You live with, or have sex with, someone who has hepatitis B.  You are a man who has sex with other men (MSM).  You get hemodialysis treatment.  You take certain medicines for conditions such as cancer, organ transplantation, and autoimmune conditions.  Hepatitis C blood testing is recommended for all people born from [birth year] through 1965 and any individual with known risks for hepatitis C.  Practice safe sex. Use condoms and avoid high-risk sexual practices to reduce the spread of sexually transmitted infections (STIs). STIs include gonorrhea, chlamydia, syphilis, trichomonas, herpes, HPV, and human immunodeficiency virus (HIV). Herpes, HIV, and HPV are viral illnesses that have no cure. They can result in disability,  cancer, and death.  If you are a man who has sex with other men, you should be screened at least once per year for:  HIV.  Urethral, rectal, and pharyngeal infection of gonorrhea, chlamydia, or both.  If you are at risk of being infected with HIV, it is recommended that you take a prescription medicine daily to prevent HIV infection. This is called preexposure prophylaxis (PrEP). You are considered at risk if:  You are a man who has sex with other men (MSM) and have other risk factors.  You are a heterosexual man, are sexually active, and are at increased risk for HIV infection.  You take drugs by injection.  You are sexually active with a partner who has HIV.  Talk with your health care provider about whether you are at high risk of being infected with HIV. If you choose to begin PrEP, you should first be tested for HIV. You should then be tested every 3 months for as long as you are taking PrEP.  A one-time screening for abdominal  aortic aneurysm (AAA) and surgical repair of large AAAs by ultrasound are recommended for men ages 54 to 79 years who are current or former smokers.  Healthy men should no longer receive prostate-specific antigen (PSA) blood tests as part of routine cancer screening. Talk with your health care provider about prostate cancer screening.  Testicular cancer screening is not recommended for adult males who have no symptoms. Screening includes self-exam, a health care provider exam, and other screening tests. Consult with your health care provider about any symptoms you have or any concerns you have about testicular cancer.  Use sunscreen. Apply sunscreen liberally and repeatedly throughout the day. You should seek shade when your shadow is shorter than you. Protect yourself by wearing long sleeves, pants, a wide-brimmed hat, and sunglasses year round, whenever you are outdoors.  Once a month, do a whole-body skin exam, using a mirror to look at the skin on your back. Tell your health care provider about new moles, moles that have irregular borders, moles that are larger than a pencil eraser, or moles that have changed in shape or color.  Stay current with required vaccines (immunizations).  Influenza vaccine. All adults should be immunized every year.  Tetanus, diphtheria, and acellular pertussis (Td, Tdap) vaccine. An adult who has not previously received Tdap or who does not know his vaccine status should receive 1 dose of Tdap. This initial dose should be followed by tetanus and diphtheria toxoids (Td) booster doses every 10 years. Adults with an unknown or incomplete history of completing a 3-dose immunization series with Td-containing vaccines should begin or complete a primary immunization series including a Tdap dose. Adults should receive a Td booster every 10 years.  Varicella vaccine. An adult without evidence of immunity to varicella should receive 2 doses or a second dose if he has previously  received 1 dose.  Human papillomavirus (HPV) vaccine. Males aged 11-21 years who have not received the vaccine previously should receive the 3-dose series. Males aged 22-26 years may be immunized. Immunization is recommended through the age of 29 years for any male who has sex with males and did not get any or all doses earlier. Immunization is recommended for any person with an immunocompromised condition through the age of 47 years if he did not get any or all doses earlier. During the 3-dose series, the second dose should be obtained 4-8 weeks after the first dose. The third dose should be  obtained 24 weeks after the first dose and 16 weeks after the second dose.  Zoster vaccine. One dose is recommended for adults aged 33 years or older unless certain conditions are present.  Measles, mumps, and rubella (MMR) vaccine. Adults born before [birth year] generally are considered immune to measles and mumps. Adults born in [birth year] or later should have 1 or more doses of MMR vaccine unless there is a contraindication to the vaccine or there is laboratory evidence of immunity to each of the three diseases. A routine second dose of MMR vaccine should be obtained at least 28 days after the first dose for students attending postsecondary schools, health care workers, or international travelers. People who received inactivated measles vaccine or an unknown type of measles vaccine during 1963-1967 should receive 2 doses of MMR vaccine. People who received inactivated mumps vaccine or an unknown type of mumps vaccine before 1979 and are at high risk for mumps infection should consider immunization with 2 doses of MMR vaccine. Unvaccinated health care workers born before [birth year] who lack laboratory evidence of measles, mumps, or rubella immunity or laboratory confirmation of disease should consider measles and mumps immunization with 2 doses of MMR vaccine or rubella immunization with 1 dose of MMR vaccine.  Pneumococcal 13-valent  conjugate (PCV13) vaccine. When indicated, a person who is uncertain of his immunization history and has no record of immunization should receive the PCV13 vaccine. All adults 30 years of age and older should receive this vaccine. An adult aged 75 years or older who has certain medical conditions and has not been previously immunized should receive 1 dose of PCV13 vaccine. This PCV13 should be followed with a dose of pneumococcal polysaccharide (PPSV23) vaccine. Adults who are at high risk for pneumococcal disease should obtain the PPSV23 vaccine at least 8 weeks after the dose of PCV13 vaccine. Adults older than 53 years of age who have normal immune system function should obtain the PPSV23 vaccine dose at least 1 year after the dose of PCV13 vaccine.  Pneumococcal polysaccharide (PPSV23) vaccine. When PCV13 is also indicated, PCV13 should be obtained first. All adults aged 60 years and older should be immunized. An adult younger than age 7 years who has certain medical conditions should be immunized. Any person who resides in a nursing home or long-term care facility should be immunized. An adult smoker should be immunized. People with an immunocompromised condition and certain other conditions should receive both PCV13 and PPSV23 vaccines. People with human immunodeficiency virus (HIV) infection should be immunized as soon as possible after diagnosis. Immunization during chemotherapy or radiation therapy should be avoided. Routine use of PPSV23 vaccine is not recommended for American Indians, [place] Natives, or people younger than 65 years unless there are medical conditions that require PPSV23 vaccine. When indicated, people who have unknown immunization and have no record of immunization should receive PPSV23 vaccine. One-time revaccination 5 years after the first dose of PPSV23 is recommended for people aged 19-64 years who have chronic kidney failure, nephrotic syndrome, asplenia, or immunocompromised  conditions. People who received 1-2 doses of PPSV23 before age 1 years should receive another dose of PPSV23 vaccine at age 14 years or later if at least 5 years have passed since the previous dose. Doses of PPSV23 are not needed for people immunized with PPSV23 at or after age 60 years.  Meningococcal vaccine. Adults with asplenia or persistent complement component deficiencies should receive 2 doses of quadrivalent meningococcal conjugate (MenACWY-D) vaccine. The doses should be obtained  at least 2 months apart. Microbiologists working with certain meningococcal bacteria, [place] recruits, people at risk during an outbreak, and people who travel to or live in countries with a high rate of meningitis should be immunized. A first-year college student up through age 49 years who is living in a residence hall should receive a dose if he did not receive a dose on or after his 16th birthday. Adults who have certain high-risk conditions should receive one or more doses of vaccine.  Hepatitis A vaccine. Adults who wish to be protected from this disease, have chronic liver disease, work with hepatitis A-infected animals, work in hepatitis A research labs, or travel to or work in countries with a high rate of hepatitis A should be immunized. Adults who were previously unvaccinated and who anticipate close contact with an international adoptee during the first 60 days after arrival in the [place] from a country with a high rate of hepatitis A should be immunized.  Hepatitis B vaccine. Adults should be immunized if they wish to be protected from this disease, are under age 65 years and have diabetes, have chronic liver disease, have had more than one sex partner in the past 6 months, may be exposed to blood or other infectious body fluids, are household contacts or sex partners of hepatitis B positive people, are clients or workers in certain care facilities, or travel to or work in countries with a high rate of  hepatitis B.  Haemophilus influenzae type b (Hib) vaccine. A previously unvaccinated person with asplenia or sickle cell disease or having a scheduled splenectomy should receive 1 dose of Hib vaccine. Regardless of previous immunization, a recipient of a hematopoietic stem cell transplant should receive a 3-dose series 6-12 months after his successful transplant. Hib vaccine is not recommended for adults with HIV infection. Preventive Service / Frequency Ages 33 to 78  Blood pressure check.** / Every 3-5 years.  Lipid and cholesterol check.** / Every 5 years beginning at age 41.  Hepatitis C blood test.** / For any individual with known risks for hepatitis C.  Skin self-exam. / Monthly.  Influenza vaccine. / Every year.  Tetanus, diphtheria, and acellular pertussis (Tdap, Td) vaccine.** / Consult your health care provider. 1 dose of Td every 10 years.  Varicella vaccine.** / Consult your health care provider.  HPV vaccine. / 3 doses over 6 months, if 53 or younger.  Measles, mumps, rubella (MMR) vaccine.** / You need at least 1 dose of MMR if you were born in 1957 or later. You may also need a second dose.  Pneumococcal 13-valent conjugate (PCV13) vaccine.** / Consult your health care provider.  Pneumococcal polysaccharide (PPSV23) vaccine.** / 1 to 2 doses if you smoke cigarettes or if you have certain conditions.  Meningococcal vaccine.** / 1 dose if you are age 94 to 38 years and a Market researcher living in a residence hall, or have one of several medical conditions. You may also need additional booster doses.  Hepatitis A vaccine.** / Consult your health care provider.  Hepatitis B vaccine.** / Consult your health care provider.  Haemophilus influenzae type b (Hib) vaccine.** / Consult your health care provider. Ages 15 to 67  Blood pressure check.** / Every year.  Lipid and cholesterol check.** / Every 5 years beginning at age 61.  Lung cancer screening. /  Every year if you are aged 67-80 years and have a 30-pack-year history of smoking and currently smoke or have quit within the past  15 years. Yearly screening is stopped once you have quit smoking for at least 15 years or develop a health problem that would prevent you from having lung cancer treatment.  Fecal occult blood test (FOBT) of stool. / Every year beginning at age 68 and continuing until age 48. You may not have to do this test if you get a colonoscopy every 10 years.  Flexible sigmoidoscopy** or colonoscopy.** / Every 5 years for a flexible sigmoidoscopy or every 10 years for a colonoscopy beginning at age 65 and continuing until age 20.  Hepatitis C blood test.** / For all people born from [birth year] through 1965 and any individual with known risks for hepatitis C.  Skin self-exam. / Monthly.  Influenza vaccine. / Every year.  Tetanus, diphtheria, and acellular pertussis (Tdap/Td) vaccine.** / Consult your health care provider. 1 dose of Td every 10 years.  Varicella vaccine.** / Consult your health care provider.  Zoster vaccine.** / 1 dose for adults aged 30 years or older.  Measles, mumps, rubella (MMR) vaccine.** / You need at least 1 dose of MMR if you were born in 1955 or later. You may also need a second dose.  Pneumococcal 13-valent conjugate (PCV13) vaccine.** / Consult your health care provider.  Pneumococcal polysaccharide (PPSV23) vaccine.** / 1 to 2 doses if you smoke cigarettes or if you have certain conditions.  Meningococcal vaccine.** / Consult your health care provider.  Hepatitis A vaccine.** / Consult your health care provider.  Hepatitis B vaccine.** / Consult your health care provider.  Haemophilus influenzae type b (Hib) vaccine.** / Consult your health care provider. Ages 6 and over  Blood pressure check.** / Every year.  Lipid and cholesterol check.**/ Every 5 years beginning at age 33.  Lung cancer screening. / Every year if you are aged 32-80  years and have a 30-pack-year history of smoking and currently smoke or have quit within the past 15 years. Yearly screening is stopped once you have quit smoking for at least 15 years or develop a health problem that would prevent you from having lung cancer treatment.  Fecal occult blood test (FOBT) of stool. / Every year beginning at age 72 and continuing until age 26. You may not have to do this test if you get a colonoscopy every 10 years.  Flexible sigmoidoscopy** or colonoscopy.** / Every 5 years for a flexible sigmoidoscopy or every 10 years for a colonoscopy beginning at age 70 and continuing until age 44.  Hepatitis C blood test.** / For all people born from [birth year] through 1963 and any individual with known risks for hepatitis C.  Abdominal aortic aneurysm (AAA) screening.** / A one-time screening for ages 41 to 79 years who are current or former smokers.  Skin self-exam. / Monthly.  Influenza vaccine. / Every year.  Tetanus, diphtheria, and acellular pertussis (Tdap/Td) vaccine.** / 1 dose of Td every 10 years.  Varicella vaccine.** / Consult your health care provider.  Zoster vaccine.** / 1 dose for adults aged 36 years or older.  Pneumococcal 13-valent conjugate (PCV13) vaccine.** / 1 dose for all adults aged 9 years and older.  Pneumococcal polysaccharide (PPSV23) vaccine.** / 1 dose for all adults aged 23 years and older.  Meningococcal vaccine.** / Consult your health care provider.  Hepatitis A vaccine.** / Consult your health care provider.  Hepatitis B vaccine.** / Consult your health care provider.  Haemophilus influenzae type b (Hib) vaccine.** / Consult your health care provider. **Family history and personal history of risk  and conditions may change your health care provider's recommendations.   This information is not intended to replace advice given to you by your health care provider. Make sure you discuss any questions you have with your health care  provider.   Document Released: 03/28/2001 Document Revised: 02/20/2014 Document Reviewed: 06/27/2010 Elsevier Interactive Patient Education [employer].

## 2015-03-24 NOTE — Assessment & Plan Note (Signed)
Cbc, cmp, tsh, lipid panel, psa, ua today. You could also ask insurance if they pay for hep c testing and shingles vaccine.

## 2015-03-24 NOTE — Addendum Note (Signed)
Addended by: Erin Hearing on: 03/24/2015 03:22 PM   Modules accepted: Orders

## 2015-03-24 NOTE — Progress Notes (Signed)
Subjective:    Patient ID: Mark [name], male    DOB: 19-Sep-1960, 55 y.o.   MRN: [id number]  HPI  I have reviewed pt PMH, PSH, FH, Social History and Surgical History  Pt wants CPE  Pt manages show room sales, Pt runs 3-4 times a weeks.(3-4 miles). Pt does drink 3 cups of coffee a day. Married- 2 children   No current problems today. Pt former Dr. Is Alwyn Ren.  Genella Rife- pt has occasional gerd but not frequent. Every 3 days sometimes for hour or so after meal.  Family history of prostate cancer. Dx at 72 years.  Pt had colonosocoy in 2015. Told repeat in 10 years.  Pt declines flu vaccine.   Also declined hep c test today.     Review of Systems  Constitutional: Negative for fever, chills and fatigue.  HENT: Negative for congestion, facial swelling, postnasal drip, rhinorrhea, sinus pressure and sore throat.   Respiratory: Negative for cough, chest tightness, shortness of breath and wheezing.   Cardiovascular: Negative for chest pain and palpitations.  Gastrointestinal: Negative for nausea, abdominal pain and constipation.  Genitourinary: Negative for dysuria and urgency.  Musculoskeletal: Negative for back pain.  Neurological: Negative for dizziness and headaches.  Hematological: Negative for adenopathy. Does not bruise/bleed easily.  Psychiatric/Behavioral: Negative for confusion and dysphoric mood.   Past Medical History  Diagnosis Date  . Tinnitus     w/o hearing loss  . GERD (gastroesophageal reflux disease)   . Fasting hyperglycemia      PMH of  . Family history of prostate cancer      DRE WNL; PSA never > 0.38    Social History   Social History  . Marital Status: Married    Spouse Name: N/A  . Number of Children: N/A  . Years of Education: N/A   Occupational History  . Not on file.   Social History Main Topics  . Smoking status: Never Smoker   . Smokeless tobacco: Never Used  . Alcohol Use: Yes     Comment: very rare wine , < one per month per  pt.  . Drug Use: No  . Sexual Activity: Yes   Other Topics Concern  . Not on file   Social History Narrative    Past Surgical History  Procedure Laterality Date  . Tonsillectomy and adenoidectomy    . Vasectomy    . Colonoscopy  05/2013    Neg, Dr Marina Goodell  . Knee surgery Right     Family History  Problem Relation Age of Onset  . COPD Mother     smoker  . Prostate cancer Father 32    no longer with prostate cancer  . Breast cancer Sister   . COPD Paternal Aunt     smoker  . Stroke Maternal Grandfather      late 93s  . Heart disease Neg Hx   . Colon cancer Neg Hx   . Esophageal cancer Neg Hx   . Pancreatic cancer Neg Hx   . Rectal cancer Neg Hx   . Stomach cancer Neg Hx   . Diabetes Neg Hx     Allergies  Allergen Reactions  . Penicillins     ? Reaction @ age 61    Current Outpatient Prescriptions on File Prior to Visit  Medication Sig Dispense Refill  . LORazepam (ATIVAN) 0.5 MG tablet Take 1 tablet by mouth daily as needed for anxiety-- pt needs to establish with new PCP 30 tablet 0  .  fluticasone (FLONASE) 50 MCG/ACT nasal spray Place 2 sprays into both nostrils daily. (Patient not taking: Reported on 03/24/2015) 16 g 1   No current facility-administered medications on file prior to visit.    BP 112/64 mmHg  Pulse 64  Temp(Src) 97.8 F (36.6 C) (Oral)  Ht 6' 0.75" (1.848 m)  Wt 171 lb 6.4 oz (77.747 kg)  BMI 22.77 kg/m2  SpO2 98%       Objective:   Physical Exam  General Mental Status- Alert. General Appearance- Not in acute distress.   Skin General: Color- Normal Color. Moisture- Normal Moisture. No worrisome lesions.  Neck Carotid Arteries- Normal color. Moisture- Normal Moisture. No carotid bruits. No JVD.  Chest and Lung Exam Auscultation: Breath Sounds:-Normal.  Cardiovascular Auscultation:Rythm- Regular. Murmurs & Other Heart Sounds:Auscultation of the heart reveals- No Murmurs.  Abdomen Inspection:-Inspeection  Normal. Palpation/Percussion:Note:No mass. Palpation and Percussion of the abdomen reveal- Non Tender, Non Distended + BS, no rebound or guarding.  Neurologic Cranial Nerve exam:- CN III-XII intact(No nystagmus), symmetric smile. Strength:- 5/5 equal and symmetric strength both upper and lower extremities.   Male Genitourinary Urethra:- No discharge. Penis- Circumcised. Scrotum- No masses. Testes- Bilateral-Normal.  Rectal Anorectal Exam: Performed- Normal sphincter tone. No masses noted. Prostate smooth normal size. Stool HEME Negative.       Assessment & Plan:

## 2015-03-25 LAB — HIV ANTIBODY (ROUTINE TESTING W REFLEX): HIV: NONREACTIVE

## 2015-04-27 ENCOUNTER — Telehealth: Payer: Self-pay | Admitting: Medical

## 2015-04-27 ENCOUNTER — Encounter: Payer: 59 | Admitting: Family Medicine

## 2015-04-27 NOTE — Telephone Encounter (Signed)
Caller name: Self   Can be reached: [phone]   Pharmacy:  [facility] - [place], [place] - [phone] (Phone) [phone] (Fax)       Reason for call:  Refill LORazepam (ATIVAN) 0.5 MG tablet [[id number]

## 2015-04-27 NOTE — Telephone Encounter (Signed)
Pt needs appointment for anxiety. I have seen him twice. We never discussed that I can see on chart review?

## 2015-04-27 NOTE — Telephone Encounter (Signed)
Left message for pt to call back to set up an appointment for the anxiety medication.

## 2015-04-27 NOTE — Telephone Encounter (Signed)
Mark [name] please advise on refill.  Pt was last seen on 03/24/15.

## 2015-04-28 ENCOUNTER — Encounter: Payer: Self-pay | Admitting: Medical

## 2015-04-28 ENCOUNTER — Ambulatory Visit (INDEPENDENT_AMBULATORY_CARE_PROVIDER_SITE_OTHER): Payer: 59 | Admitting: Medical

## 2015-04-28 VITALS — BP 116/88 | HR 70 | Temp 97.6°F | Ht 72.75 in | Wt 171.2 lb

## 2015-04-28 DIAGNOSIS — F411 Generalized anxiety disorder: Secondary | ICD-10-CM

## 2015-04-28 MED ORDER — LORAZEPAM 0.5 MG PO TABS
ORAL_TABLET | ORAL | Status: DC
Start: 2015-04-28 — End: 2015-07-08

## 2015-04-28 NOTE — Progress Notes (Signed)
Subjective:    Patient ID: Mark [name], male    DOB: [birth year], 55 y.o.   MRN: [id number]  HPI  Pt in for anxiety. Pt got last rx from Dr. Oliver BarreJames [name]. Pt never saw him but got rx from that practice since Dr .Mark [name] was retiring. Pt would use ativan in past intermittently. Pt states in past would use ativan mostly when traveling. Would help with sleeping or for traveling anxiety. He does not need the prescription every day. Our records show intermittent rx one in 2014 and then another in 2015.   No daily chronic anxiety. Occasionally will need ativan at work for work associated anxiety.  I check nccsr website and did not see any controlled meds. Nothing showed.  He also state in past tried other meds for anxiety and had side effects.   Review of Systems  Constitutional: Negative for fever, chills and fatigue.  Respiratory: Negative for cough, chest tightness, shortness of breath and wheezing.   Cardiovascular: Negative for chest pain and palpitations.  Gastrointestinal: Negative for abdominal pain.  Musculoskeletal: Negative for back pain.  Neurological: Negative for dizziness, syncope, weakness and numbness.  Hematological: Negative for adenopathy. Does not bruise/bleed easily.  Psychiatric/Behavioral: Negative for suicidal ideas, behavioral problems, confusion and self-injury. The patient is nervous/anxious.    Past Medical History  Diagnosis Date  . Tinnitus     w/o hearing loss  . GERD (gastroesophageal reflux disease)   . Fasting hyperglycemia      PMH of  . Family history of prostate cancer      DRE WNL; PSA never > 0.38    Social History   Social History  . Marital Status: Married    Spouse Name: N/A  . Number of Children: N/A  . Years of Education: N/A   Occupational History  . Not on file.   Social History Main Topics  . Smoking status: Never Smoker   . Smokeless tobacco: Never Used  . Alcohol Use: Yes     Comment: very rare wine , < one per month per  pt.  . Drug Use: No  . Sexual Activity: Yes   Other Topics Concern  . Not on file   Social History Narrative    Past Surgical History  Procedure Laterality Date  . Tonsillectomy and adenoidectomy    . Vasectomy    . Colonoscopy  05/2013    Neg, Dr Mark [name]  . Knee surgery Right     Family History  Problem Relation Age of Onset  . COPD Mother     smoker  . Prostate cancer Father 7970    no longer with prostate cancer  . Breast cancer Sister   . COPD Paternal Aunt     smoker  . Stroke Maternal Grandfather      late 1860s  . Heart disease Neg Hx   . Colon cancer Neg Hx   . Esophageal cancer Neg Hx   . Pancreatic cancer Neg Hx   . Rectal cancer Neg Hx   . Stomach cancer Neg Hx   . Diabetes Neg Hx     Allergies  Allergen Reactions  . Penicillins     ? Reaction @ age 89    Current Outpatient Prescriptions on File Prior to Visit  Medication Sig Dispense Refill  . LORazepam (ATIVAN) 0.5 MG tablet Take 1 tablet by mouth daily as needed for anxiety-- pt needs to establish with new PCP 30 tablet 0   No current facility-administered medications on  file prior to visit.    BP 116/88 mmHg  Pulse 70  Temp(Src) 97.6 F (36.4 C) (Oral)  Ht 6' 0.75" (1.848 m)  Wt 171 lb 3.2 oz (77.656 kg)  BMI 22.74 kg/m2  SpO2 98%       Objective:   Physical Exam  General Mental Status- Alert. General Appearance- Not in acute distress.   Skin General: Color- Normal Color. Moisture- Normal Moisture.  Neck Carotid Arteries- Normal color. Moisture- Normal Moisture. No carotid bruits. No JVD.  Chest and Lung Exam Auscultation: Breath Sounds:-Normal.  Cardiovascular Auscultation:Rythm- Regular. Murmurs & Other Heart Sounds:Auscultation of the heart reveals- No Murmurs.   Neurologic Cranial Nerve exam:- CN III-XII intact(No nystagmus), symmetric smile. Strength:- 5/5 equal and symmetric strength both upper and lower extremities.      Assessment & Plan:  Anxiety state For  your anxiety will give ativan rx. Will give 30 tabs to use sparingly when needed. Then evaluate for follow up when runs out. If needed more chronic daily basis then will need to have contract in place.

## 2015-04-28 NOTE — Progress Notes (Signed)
Pre visit review using our clinic review tool, if applicable. No additional management support is needed unless otherwise documented below in the visit note. 

## 2015-04-28 NOTE — Patient Instructions (Addendum)
Anxiety state For your anxiety will give ativan rx. Will give 30 tabs to use sparingly when needed. Then evaluate for follow up when runs out. If needed more chronic daily basis then will need to have contract in place.    Follow up in 1-2 months or as needed.

## 2015-04-28 NOTE — Assessment & Plan Note (Signed)
For your anxiety will give ativan rx. Will give 30 tabs to use sparingly when needed. Then evaluate for follow up when runs out. If needed more chronic daily basis then will need to have contract in place.

## 2015-07-08 ENCOUNTER — Other Ambulatory Visit: Payer: Self-pay | Admitting: Family Medicine

## 2015-07-09 NOTE — Telephone Encounter (Signed)
Last filled: 04/28/15 Amt: 30,0 Last OV:  03/24/15  Controlled Substance Contract Signed 01/07/2013 Last: 01/10/13 Low Risk.  Please advise.

## 2015-09-02 ENCOUNTER — Telehealth: Payer: Self-pay

## 2015-09-02 MED ORDER — LORAZEPAM 0.5 MG PO TABS
ORAL_TABLET | ORAL | Status: DC
Start: 2015-09-02 — End: 2015-10-27

## 2015-09-02 NOTE — Telephone Encounter (Signed)
Left message for pt to call back with any questions about his Ativan 0.5mg  refill and that it was sent to [facility] on [address]. Pt will need a follow up with pcp.

## 2015-09-02 NOTE — Telephone Encounter (Signed)
I did rx ativan. He can pick up but appoitment in one month needed.

## 2015-09-02 NOTE — Telephone Encounter (Signed)
Pt was last seen in March 2017 and he has an appointment 03/2016.  Last refill was 07/09/15. Please advise on refill.

## 2015-09-06 NOTE — Telephone Encounter (Signed)
Letter mailed to patient to cal and schedule a follow up for his ativan per verbal order from E. Saguier.

## 2015-09-30 ENCOUNTER — Encounter: Payer: Self-pay | Admitting: Medical

## 2015-09-30 ENCOUNTER — Ambulatory Visit (INDEPENDENT_AMBULATORY_CARE_PROVIDER_SITE_OTHER): Payer: 59 | Admitting: Medical

## 2015-09-30 VITALS — BP 115/77 | HR 60 | Temp 98.2°F | Ht 73.0 in | Wt 177.6 lb

## 2015-09-30 DIAGNOSIS — F411 Generalized anxiety disorder: Secondary | ICD-10-CM

## 2015-09-30 NOTE — Progress Notes (Signed)
Pre visit review using our clinic tool,if applicable. No additional management support is needed unless otherwise documented below in the visit note.  

## 2015-09-30 NOTE — Progress Notes (Signed)
Subjective:    Patient ID: Mark MansJeffrey Koral, male    DOB: 1960/11/04, 55 y.o.   MRN: [id number]  HPI   Pt in for follow up on his anxiety. He states he is doing well. He is taking ativan usually when he travels. Sometimes takes pre flights. And will also take for insomnia when travels. Pt gets refil about about 2 months.  On average he takes he takes about 2-3 times a week.  Pt is going [place] trip to [place] (mission trip with church).  No depression reported.    Review of Systems  Constitutional: Negative for chills, fatigue and fever.  Respiratory: Negative for cough, chest tightness, shortness of breath and wheezing.   Cardiovascular: Negative for chest pain and palpitations.  Gastrointestinal: Negative for anal bleeding and diarrhea.  Musculoskeletal: Negative for back pain.  Skin: Negative for rash.  Neurological: Negative for dizziness and light-headedness.  Hematological: Negative for adenopathy. Does not bruise/bleed easily.  Psychiatric/Behavioral: Negative for agitation and confusion. The patient is nervous/anxious.     Past Medical History:  Diagnosis Date  . Family history of prostate cancer     DRE WNL; PSA never > 0.38  . Fasting hyperglycemia     PMH of  . GERD (gastroesophageal reflux disease)   . Tinnitus    w/o hearing loss     Social History   Social History  . Marital status: Married    Spouse name: N/A  . Number of children: N/A  . Years of education: N/A   Occupational History  . Not on file.   Social History Main Topics  . Smoking status: Never Smoker  . Smokeless tobacco: Never Used  . Alcohol use Yes     Comment: very rare wine , < one per month per pt.  . Drug use: No  . Sexual activity: Yes   Other Topics Concern  . Not on file   Social History Narrative  . No narrative on file    Past Surgical History:  Procedure Laterality Date  . COLONOSCOPY  05/2013   Neg, Dr [name]  . KNEE SURGERY Right   . TONSILLECTOMY AND  ADENOIDECTOMY    . VASECTOMY      Family History  Problem Relation Age of Onset  . COPD Mother     smoker  . Prostate cancer Father 3470    no longer with prostate cancer  . Breast cancer Sister   . COPD Paternal Aunt     smoker  . Stroke Maternal Grandfather      late 6160s  . Heart disease Neg Hx   . Colon cancer Neg Hx   . Esophageal cancer Neg Hx   . Pancreatic cancer Neg Hx   . Rectal cancer Neg Hx   . Stomach cancer Neg Hx   . Diabetes Neg Hx     Allergies  Allergen Reactions  . Penicillins     ? Reaction @ age 89    Current Outpatient Prescriptions on File Prior to Visit  Medication Sig Dispense Refill  . LORazepam (ATIVAN) 0.5 MG tablet TAKE ONE TABLET BY MOUTH DAILY AS NEEDED FOR ANXIETY 30 tablet 0   No current facility-administered medications on file prior to visit.     BP 115/77   Pulse 60   Temp 98.2 F (36.8 C) (Oral)   Ht 6\' 1"  (1.854 m)   Wt 177 lb 9.6 oz (80.6 kg)   SpO2 99%   BMI 23.43 kg/m  Objective:   Physical Exam  General Mental Status- Alert. General Appearance- Not in acute distress.    Chest and Lung Exam Auscultation: Breath Sounds:-Normal.  Cardiovascular Auscultation:Rythm- Regular. Murmurs & Other Heart Sounds:Auscultation of the heart reveals- No Murmurs.  . Neurologic Cranial Nerve exam:- CN III-XII intact(No nystagmus), symmetric smile. Strength:- 5/5 equal and symmetric strength both upper and lower extremities.      Assessment & Plan:  For your intermittent anxiety will rx ativan again when you are due for refill . Just let us know and we can have you pick up rx or send to your pharmacy  If anxiety becomes worse/daily then would recommend adding SSRI type med.  Controlled med agreement signed today.  Lab closed when pt signed contract.  Follow up in 3-6 months/or as needed  Oda Placke, [place], VF CorporationPA-C

## 2015-09-30 NOTE — Patient Instructions (Addendum)
For your intermittent anxiety will rx ativan again when you are due for refill . Just let us know and we can have you pick up rx or send to your pharmacy  If anxiety becomes worse/daily then would recommend adding SSRI type med.    Controlled med agreement signed today.  Follow up in 3-6 months/or as needed

## 2015-10-26 ENCOUNTER — Telehealth: Payer: Self-pay | Admitting: Medical

## 2015-10-26 NOTE — Telephone Encounter (Signed)
Caller name: Relationship to patient: Self Can be reached: [phone]  Pharmacy:  Reason for call: Patient is traveling out of the country 10/8 and needs a Flu Shot, Hep, and Malaria. Plse adv

## 2015-10-27 ENCOUNTER — Other Ambulatory Visit: Payer: Self-pay

## 2015-10-27 NOTE — Telephone Encounter (Signed)
He discussed this on last visit. He could get flu vaccine and hep A vaccine. I don't see that he has this before and this would be hepatitis he would be at most risk. He can get 1st in series and then second in 6 months. So can schedule nurse visit. I could give him the malaria prevention rx when he is in for nurse visit. Also I don't he is due for ativan yet. But it is loaded as refill??

## 2015-10-28 NOTE — Telephone Encounter (Signed)
Will you look at note sent to Clydie BraunKaren and call this pt please. I am not sure if she has called him. She may be too busy with other call backs. I send her a lot.

## 2015-10-29 ENCOUNTER — Telehealth: Payer: Self-pay | Admitting: Medical

## 2015-10-29 MED ORDER — LORAZEPAM 0.5 MG PO TABS: ORAL_TABLET | ORAL | 0 refills | Status: DC

## 2015-10-29 NOTE — Telephone Encounter (Signed)
Will you send in his ativan rx to [facility] on [address].

## 2015-10-29 NOTE — Telephone Encounter (Signed)
Patient called in regarding refiull. Pewr E Saguier he will talk to him. Call transfered

## 2015-10-29 NOTE — Telephone Encounter (Signed)
Relation to ZO:XWRUpt:self Call back number:[phone]:  Reason for call:  Patient would like the following vaccination due to traveling out the country. Hep , flu shot and Malaria vaccination, please advise.

## 2015-11-01 ENCOUNTER — Ambulatory Visit: Payer: 59 | Admitting: Medical

## 2015-11-02 NOTE — Telephone Encounter (Signed)
Rx sent to pharmacy   

## 2015-11-03 ENCOUNTER — Ambulatory Visit (INDEPENDENT_AMBULATORY_CARE_PROVIDER_SITE_OTHER): Payer: 59 | Admitting: Medical

## 2015-11-03 ENCOUNTER — Encounter: Payer: Self-pay | Admitting: Medical

## 2015-11-03 VITALS — BP 109/71 | HR 77 | Temp 98.2°F | Ht 73.0 in | Wt 178.0 lb

## 2015-11-03 DIAGNOSIS — Z23 Encounter for immunization: Secondary | ICD-10-CM

## 2015-11-03 DIAGNOSIS — J309 Allergic rhinitis, unspecified: Secondary | ICD-10-CM | POA: Diagnosis not present

## 2015-11-03 MED ORDER — ATOVAQUONE-PROGUANIL HCL 250-100 MG PO TABS: ORAL_TABLET | ORAL | 0 refills | Status: DC

## 2015-11-03 MED ORDER — LEVOCETIRIZINE DIHYDROCHLORIDE 5 MG PO TABS: 5.0000 mg | ORAL_TABLET | Freq: Every evening | ORAL | 1 refills | Status: DC

## 2015-11-03 MED ORDER — FLUTICASONE PROPIONATE 50 MCG/ACT NA SUSP: 2.0000 | Freq: Every day | NASAL | 1 refills | Status: DC

## 2015-11-03 NOTE — Progress Notes (Signed)
Subjective:    Patient ID: Mark [name], male    DOB: 30-May-1960, 55 y.o.   MRN: [id number]  HPI  Pt is about to go to [place].   He need hep a, yellow fever vaccine, and malaria vaccine.   Pt will get yellow fever through his church. No mention of typhoid. Pt is declining typhoid with me presently.   But will get  malaria vaccine tabs today.   Trip is November 21, 2015.  He also wants the flu vaccine today.  Pt picked up ativan the other day for his anxiety. Not addressed today.   Pt also mentioned mild nasal congestion with faint pnd. Pt states 7-8 years since had allergy type symptoms in fall. Some mild sneezing.  Symptoms for one week.     Review of Systems  Constitutional: Negative for chills, fatigue and fever.  HENT: Positive for congestion, postnasal drip and sneezing. Negative for ear pain, sore throat and tinnitus.        Only faint sore throat med on and off and associated with pnd.  Respiratory: Negative for cough, chest tightness, shortness of breath and wheezing.   Cardiovascular: Negative for chest pain and palpitations.  Gastrointestinal: Negative for abdominal pain.  Genitourinary: Negative for difficulty urinating, flank pain, frequency and hematuria.  Skin: Negative for rash.  Neurological: Negative for dizziness and headaches.  Hematological: Negative for adenopathy. Does not bruise/bleed easily.  Psychiatric/Behavioral: Negative for behavioral problems, decreased concentration and dysphoric mood.    Past Medical History:  Diagnosis Date  . Family history of prostate cancer     DRE WNL; PSA never > 0.38  . Fasting hyperglycemia     PMH of  . GERD (gastroesophageal reflux disease)   . Tinnitus    w/o hearing loss     Social History   Social History  . Marital status: Married    Spouse name: N/A  . Number of children: N/A  . Years of education: N/A   Occupational History  . Not on file.   Social History Main Topics  . Smoking status:  Never Smoker  . Smokeless tobacco: Never Used  . Alcohol use Yes     Comment: very rare wine , < one per month per pt.  . Drug use: No  . Sexual activity: Yes   Other Topics Concern  . Not on file   Social History Narrative  . No narrative on file    Past Surgical History:  Procedure Laterality Date  . COLONOSCOPY  05/2013   Neg, Dr Marina Goodell  . KNEE SURGERY Right   . TONSILLECTOMY AND ADENOIDECTOMY    . VASECTOMY      Family History  Problem Relation Age of Onset  . COPD Mother     smoker  . Prostate cancer Father 80    no longer with prostate cancer  . Breast cancer Sister   . COPD Paternal Aunt     smoker  . Stroke Maternal Grandfather      late 78s  . Heart disease Neg Hx   . Colon cancer Neg Hx   . Esophageal cancer Neg Hx   . Pancreatic cancer Neg Hx   . Rectal cancer Neg Hx   . Stomach cancer Neg Hx   . Diabetes Neg Hx     Allergies  Allergen Reactions  . Penicillins     ? Reaction @ age 25    Current Outpatient Prescriptions on File Prior to Visit  Medication Sig Dispense  Refill  . LORazepam (ATIVAN) 0.5 MG tablet TAKE ONE TABLET BY MOUTH DAILY AS NEEDED FOR ANXIETY 30 tablet 0   No current facility-administered medications on file prior to visit.     BP 109/71   Pulse 77   Temp 98.2 F (36.8 C) (Oral)   Ht 6\' 1"  (1.854 m)   Wt 178 lb (80.7 kg)   SpO2 98%   BMI 23.48 kg/m       Objective:   Physical Exam  General  Mental Status - Alert. General Appearance - Well groomed. Not in acute distress.  Skin Rashes- No Rashes.  HEENT Head- Normal. Ear Auditory Canal - Left- Normal. Right - Normal.Tympanic Membrane- Left- Normal. Right- Normal. Eye Sclera/Conjunctiva- Left- Normal. Right- Normal. Nose & Sinuses Nasal Mucosa- Left-  Boggy and Congested. Right-  Boggy and  Congested.Bilateral  No maxillary and  No frontal sinus pressure. Mouth & Throat Lips: Upper Lip- Normal: no dryness, cracking, pallor, cyanosis, or vesicular eruption.  Lower Lip-Normal: no dryness, cracking, pallor, cyanosis or vesicular eruption. Buccal Mucosa- Bilateral- No Aphthous ulcers. Oropharynx- No Discharge or Erythema. +pnd. Tonsils: Characteristics- Bilateral- No Erythema or Congestion. Size/Enlargement- Bilateral- No enlargement. Discharge- bilateral-None.  Neck Neck- Supple. No Masses.   Chest and Lung Exam Auscultation: Breath Sounds:-Clear even and unlabored.  Cardiovascular Auscultation:Rythm- Regular, rate and rhythm. Murmurs & Other Heart Sounds:Ausculatation of the heart reveal- No Murmurs.  Lymphatic Head & Neck General Head & Neck Lymphatics: Bilateral: Description- No Localized lymphadenopathy.        Assessment & Plan:  You got your hepatitis A vaccine today and flu vaccine today.(reminded pt repeat hep A second dose 6 months)  Yellow fever vaccine through travel clinic.  I wrote malarone for malaria prevention.   Consider typhoid vaccine if travel clinic offered.  Follow up around February for cpe. Or as  Needed  For recent mild allergies rx flonase nasal spray and xyzal.  Esperanza RichtersSaguier, Oneta Sigman, PA-C

## 2015-11-03 NOTE — Patient Instructions (Addendum)
You got your hepatitis A vaccine today and flu vaccine today.  Yellow fever vaccine through travel clinic.  I wrote malarone for malaria prevention.   Consider typhoid vaccine if travel clinic offered.  Follow up around February for cpe. Or as  Needed  For recent mild allergies rx flonase nasal spray and xyzal. If allergy symptoms worsen or change please let me know.

## 2015-11-04 NOTE — Telephone Encounter (Signed)
Patient can in for OV.Received Flu and Hep A.vaccinations.

## 2015-11-15 ENCOUNTER — Encounter: Payer: Self-pay | Admitting: Medical

## 2015-11-17 MED ORDER — HYDROXYZINE HCL 10 MG PO TABS: ORAL_TABLET | ORAL | 0 refills | Status: DC

## 2015-11-17 NOTE — Telephone Encounter (Signed)
rx hydroxyzine sent to his pharmacy.

## 2015-12-02 ENCOUNTER — Other Ambulatory Visit: Payer: Self-pay

## 2015-12-02 MED ORDER — LORAZEPAM 0.5 MG PO TABS: 0.5000 mg | ORAL_TABLET | Freq: Two times a day (BID) | ORAL | 1 refills | Status: DC | PRN

## 2015-12-02 NOTE — Telephone Encounter (Signed)
I printed the ativan rx. Please give me so I can sign. Then he can pick up rx.

## 2015-12-02 NOTE — Telephone Encounter (Signed)
Please advise on refill for Ativan.

## 2016-02-03 ENCOUNTER — Other Ambulatory Visit: Payer: Self-pay | Admitting: Family Medicine

## 2016-02-04 NOTE — Telephone Encounter (Signed)
Last RX: 01/08/16, #30 Last OV: 11/03/15 Next OV: 03/24/16 UDS: not obtained, CSC is on file.  Rx printed and forwarded to PCP for signature.

## 2016-02-04 NOTE — Telephone Encounter (Signed)
Rx request faxed to pharmacy/SLS  

## 2016-03-09 ENCOUNTER — Other Ambulatory Visit: Payer: Self-pay | Admitting: Medical

## 2016-03-14 ENCOUNTER — Telehealth: Payer: Self-pay | Admitting: *Deleted

## 2016-03-14 NOTE — Telephone Encounter (Signed)
Faxed refill request received from [facility] for Lorazepam 0.5mg  tablet Last filled by MD on 02/04/16, #30x0 Last AEX - 11/03/15 Next AEX - 5-Mths for CPE Please Advise on refills/SLS 01/30

## 2016-03-14 NOTE — Telephone Encounter (Signed)
I printed the ativan rx. If you place it on my desk will sign the rx. Advise he can pick it up tomorrow. Or can fax that one in to his pharmacy? Have him schedule appointment in one month. Advise early am or early pm for his convenience. Am I correct he has not had recent uds?

## 2016-03-15 ENCOUNTER — Encounter: Payer: Self-pay | Admitting: Medical

## 2016-03-15 NOTE — Telephone Encounter (Signed)
Patient called to follow up on the status of his medication refill. Please advise

## 2016-03-15 NOTE — Telephone Encounter (Signed)
Rx faxed to [facility].  Sent patient mychart message that it was sent

## 2016-03-20 NOTE — Telephone Encounter (Signed)
Apolonio SchneidersSheketia note says his ativan was sent to his pharmacy. But looks like not sent to me about ativan again?

## 2016-03-24 ENCOUNTER — Encounter: Payer: Self-pay | Admitting: Medical

## 2016-03-24 ENCOUNTER — Ambulatory Visit (INDEPENDENT_AMBULATORY_CARE_PROVIDER_SITE_OTHER): Payer: 59 | Admitting: Medical

## 2016-03-24 VITALS — BP 103/79 | HR 63 | Temp 97.9°F | Resp 16 | Ht 73.0 in | Wt 173.1 lb

## 2016-03-24 DIAGNOSIS — Z125 Encounter for screening for malignant neoplasm of prostate: Secondary | ICD-10-CM

## 2016-03-24 DIAGNOSIS — Z0000 Encounter for general adult medical examination without abnormal findings: Secondary | ICD-10-CM | POA: Diagnosis not present

## 2016-03-24 LAB — POC URINALSYSI DIPSTICK (AUTOMATED)
BILIRUBIN UA: NEGATIVE
GLUCOSE UA: NEGATIVE
KETONES UA: NEGATIVE
Leukocytes, UA: NEGATIVE
Nitrite, UA: NEGATIVE
Protein, UA: NEGATIVE
RBC UA: NEGATIVE
SPEC GRAV UA: 1.02
UROBILINOGEN UA: 0.2
pH, UA: 6

## 2016-03-24 LAB — COMPREHENSIVE METABOLIC PANEL
ALBUMIN: 4.3 g/dL (ref 3.5–5.2)
ALK PHOS: 48 U/L (ref 39–117)
ALT: 24 U/L (ref 0–53)
AST: 26 U/L (ref 0–37)
BILIRUBIN TOTAL: 0.7 mg/dL (ref 0.2–1.2)
BUN: 13 mg/dL (ref 6–23)
CO2: 31 mEq/L (ref 19–32)
CREATININE: 0.9 mg/dL (ref 0.40–1.50)
Calcium: 9.3 mg/dL (ref 8.4–10.5)
Chloride: 105 mEq/L (ref 96–112)
GFR: 93.71 mL/min (ref >60.00)
GLUCOSE: 99 mg/dL (ref 70–99)
POTASSIUM: 4.2 meq/L (ref 3.5–5.1)
SODIUM: 140 meq/L (ref 135–145)
TOTAL PROTEIN: 6.8 g/dL (ref 6.0–8.3)

## 2016-03-24 LAB — CBC WITH DIFFERENTIAL/PLATELET
BASOS ABS: 0 10*3/uL (ref 0.0–0.1)
Basophils Relative: 0.8 % (ref 0.0–3.0)
EOS PCT: 0.9 % (ref 0.0–5.0)
Eosinophils Absolute: 0 10*3/uL (ref 0.0–0.7)
HCT: 43.2 % (ref 39.0–52.0)
HEMOGLOBIN: 14.2 g/dL (ref 13.0–17.0)
Lymphocytes Relative: 28.8 % (ref 12.0–46.0)
Lymphs Abs: 1.2 10*3/uL (ref 0.7–4.0)
MCHC: 33 g/dL (ref 30.0–36.0)
MCV: 95.7 fl (ref 78.0–100.0)
MONO ABS: 0.4 10*3/uL (ref 0.1–1.0)
MONOS PCT: 9.8 % (ref 3.0–12.0)
NEUTROS PCT: 59.7 % (ref 43.0–77.0)
Neutro Abs: 2.6 10*3/uL (ref 1.4–7.7)
Platelets: 152 10*3/uL (ref 150.0–400.0)
RBC: 4.51 Mil/uL (ref 4.22–5.81)
RDW: 12.7 % (ref 11.5–15.5)
WBC: 4.3 10*3/uL (ref 4.0–10.5)

## 2016-03-24 LAB — LIPID PANEL
CHOL/HDL RATIO: 2
Cholesterol: 208 mg/dL — ABNORMAL HIGH (ref 0–200)
HDL: 88.1 mg/dL (ref >39.00)
LDL Cholesterol: 113 mg/dL — ABNORMAL HIGH (ref 0–99)
NONHDL: 120.05
Triglycerides: 37 mg/dL (ref 0.0–149.0)
VLDL: 7.4 mg/dL (ref 0.0–40.0)

## 2016-03-24 LAB — TSH: TSH: 1.17 u[IU]/mL (ref 0.35–4.50)

## 2016-03-24 LAB — PSA: PSA: 0.35 ng/mL (ref 0.10–4.00)

## 2016-03-24 NOTE — Progress Notes (Signed)
Pre visit review using our clinic review tool, if applicable. No additional management support is needed unless otherwise documented below in the visit note/SLS  

## 2016-03-24 NOTE — Progress Notes (Signed)
Subjective:    Patient ID: Mark [name], male    DOB: 1960/06/21, 56 y.o.   MRN: [id number]  HPI  I have reviewed pt PMH, PSH, FH, Social History and Surgical History   Pt wants CPE  Pt manages show room sales, Pt runs 3-4 times a weeks.(3-4 miles). Pt does drink 3 cups of coffee a day. Married- 2 children   No current problems today. Pt former Dr. Is Mark [name].  Genella RifeGerd- pt has occasional gerd but not frequent. Every 3 days sometimes for hour or so after meal.(hust uses otc med like zantac)  Family history of prostate cancer. Dx at 70 years.  Pt had colonosocoy in 2015. Told repeat in 10 years.  Pt got flu vaccine in fall.  Also declined hep c test today.   Review of Systems  Constitutional: Negative for chills, fatigue and fever.  HENT: Negative for congestion, drooling, facial swelling, mouth sores, nosebleeds, postnasal drip, sinus pain, sinus pressure, sneezing, sore throat and tinnitus.   Respiratory: Negative for cough, chest tightness and shortness of breath.   Cardiovascular: Negative for chest pain and palpitations.  Gastrointestinal: Negative for abdominal pain and anal bleeding.  Genitourinary: Negative for decreased urine volume, dysuria, flank pain, frequency, hematuria, penile pain, penile swelling, scrotal swelling, testicular pain and urgency.  Musculoskeletal: Negative for back pain, myalgias and neck stiffness.  Skin: Negative for rash.  Neurological: Negative for dizziness, syncope, weakness and headaches.  Hematological: Negative for adenopathy. Does not bruise/bleed easily.  Psychiatric/Behavioral: Negative for behavioral problems, decreased concentration, dysphoric mood and suicidal ideas. The patient is nervous/anxious.        Hx of but controlled.    Past Medical History:  Diagnosis Date  . Family history of prostate cancer     DRE WNL; PSA never > 0.38  . Fasting hyperglycemia     PMH of  . GERD (gastroesophageal reflux disease)   .  Tinnitus    w/o hearing loss     Social History   Social History  . Marital status: Married    Spouse name: N/A  . Number of children: N/A  . Years of education: N/A   Occupational History  . Not on file.   Social History Main Topics  . Smoking status: Never Smoker  . Smokeless tobacco: Never Used  . Alcohol use Yes     Comment: very rare wine , < one per month per pt.  . Drug use: No  . Sexual activity: Yes   Other Topics Concern  . Not on file   Social History Narrative  . No narrative on file    Past Surgical History:  Procedure Laterality Date  . COLONOSCOPY  05/2013   Neg, Dr Mark GoodellPerry  . KNEE SURGERY Right   . TONSILLECTOMY AND ADENOIDECTOMY    . VASECTOMY      Family History  Problem Relation Age of Onset  . COPD Mother     smoker  . Prostate cancer Father [id number]    no longer with prostate cancer  . Breast cancer Sister   . COPD Paternal Aunt     smoker  . Stroke Maternal Grandfather      late 6960s  . Heart disease Neg Hx   . Colon cancer Neg Hx   . Esophageal cancer Neg Hx   . Pancreatic cancer Neg Hx   . Rectal cancer Neg Hx   . Stomach cancer Neg Hx   . Diabetes Neg Hx     Allergies  Allergen Reactions  . Penicillins     ? Reaction @ age 67    Current Outpatient Prescriptions on File Prior to Visit  Medication Sig Dispense Refill  . fluticasone (FLONASE) 50 MCG/ACT nasal spray Place 2 sprays into both nostrils daily. (Patient taking differently: Place 2 sprays into both nostrils daily as needed. ) 16 g 1  . hydrOXYzine (ATARAX/VISTARIL) 10 MG tablet 1-2 tab po tid prn anxiety 30 tablet 0  . levocetirizine (XYZAL) 5 MG tablet Take 1 tablet (5 mg total) by mouth every evening. (Patient taking differently: Take 5 mg by mouth daily as needed. ) 30 tablet 1  . LORazepam (ATIVAN) 0.5 MG tablet TAKE ONE TABLET BY MOUTH TWICE A DAY AS NEEDED FOR ANXIETY 30 tablet 0  . atovaquone-proguanil (MALARONE) 250-100 MG TABS tablet 1 tab po q day. (Start 2 days  before travel and then for one week after travel.) (Patient not taking: Reported on 03/24/2016) 20 tablet 0   No current facility-administered medications on file prior to visit.     BP 103/79 (BP Location: Left Arm, Patient Position: Sitting, Cuff Size: Normal)   Pulse 63   Temp 97.9 F (36.6 C) (Oral)   Resp 16   Ht 6\' 1"  (1.854 m)   Wt 173 lb 2 oz (78.5 kg)   SpO2 100%   BMI 22.84 kg/m       Objective:   Physical Exam  General Mental Status- Alert. General Appearance- Not in acute distress.   Skin General: Color- Normal Color. Moisture- Normal Moisture. No worrisome lesions.  Neck Carotid Arteries- Normal color. Moisture- Normal Moisture. No carotid bruits. No JVD.  Chest and Lung Exam Auscultation: Breath Sounds:-Normal.  Cardiovascular Auscultation:Rythm- Regular. Murmurs & Other Heart Sounds:Auscultation of the heart reveals- No Murmurs.  Abdomen Inspection:-Inspeection Normal. Palpation/Percussion:Note:No mass. Palpation and Percussion of the abdomen reveal- Non Tender, Non Distended + BS, no rebound or guarding.  Neurologic Cranial Nerve exam:- CN III-XII intact(No nystagmus), symmetric smile. Strength:- 5/5 equal and symmetric strength both upper and lower extremities.   Male Genitourinary Pt declined  Rectal Pt declined/deferred.      Assessment & Plan:  For you wellness exam today I have ordered cbc, cmp, tsh, lipid panel, ua and psa.  Vaccine up to date  Recommend exercise and healthy diet.  We will let you know lab results as they come in.  Follow up date appointment will be determined after lab review.

## 2016-03-24 NOTE — Patient Instructions (Addendum)
For you wellness exam today I have ordered cbc, cmp, tsh, lipid panel, ua and psa.  Vaccine up to date  Recommend exercise and healthy diet.  We will let you know lab results as they come in.  Follow up date appointment will be determined after lab review.    Preventive Care 40-64 Years, Male Preventive care refers to lifestyle choices and visits with your health care provider that can promote health and wellness. What does preventive care include?  A yearly physical exam. This is also called an annual well check.  Dental exams once or twice a year.  Routine eye exams. Ask your health care provider how often you should have your eyes checked.  Personal lifestyle choices, including:  Daily care of your teeth and gums.  Regular physical activity.  Eating a healthy diet.  Avoiding tobacco and drug use.  Limiting alcohol use.  Practicing safe sex.  Taking low-dose aspirin every day starting at age 3. What happens during an annual well check? The services and screenings done by your health care provider during your annual well check will depend on your age, overall health, lifestyle risk factors, and family history of disease. Counseling  Your health care provider may ask you questions about your:  Alcohol use.  Tobacco use.  Drug use.  Emotional well-being.  Home and relationship well-being.  Sexual activity.  Eating habits.  Work and work Statistician. Screening  You may have the following tests or measurements:  Height, weight, and BMI.  Blood pressure.  Lipid and cholesterol levels. These may be checked every 5 years, or more frequently if you are over 68 years old.  Skin check.  Lung cancer screening. You may have this screening every year starting at age 105 if you have a 30-pack-year history of smoking and currently smoke or have quit within the past 15 years.  Fecal occult blood test (FOBT) of the stool. You may have this test every year starting  at age 59.  Flexible sigmoidoscopy or colonoscopy. You may have a sigmoidoscopy every 5 years or a colonoscopy every 10 years starting at age 34.  Prostate cancer screening. Recommendations will vary depending on your family history and other risks.  Hepatitis C blood test.  Hepatitis B blood test.  Sexually transmitted disease (STD) testing.  Diabetes screening. This is done by checking your blood sugar (glucose) after you have not eaten for a while (fasting). You may have this done every 1-3 years. Discuss your test results, treatment options, and if necessary, the need for more tests with your health care provider. Vaccines  Your health care provider may recommend certain vaccines, such as:  Influenza vaccine. This is recommended every year.  Tetanus, diphtheria, and acellular pertussis (Tdap, Td) vaccine. You may need a Td booster every 10 years.  Varicella vaccine. You may need this if you have not been vaccinated.  Zoster vaccine. You may need this after age 53.  Measles, mumps, and rubella (MMR) vaccine. You may need at least one dose of MMR if you were born in 1957 or later. You may also need a second dose.  Pneumococcal 13-valent conjugate (PCV13) vaccine. You may need this if you have certain conditions and have not been vaccinated.  Pneumococcal polysaccharide (PPSV23) vaccine. You may need one or two doses if you smoke cigarettes or if you have certain conditions.  Meningococcal vaccine. You may need this if you have certain conditions.  Hepatitis A vaccine. You may need this if you have  certain conditions or if you travel or work in places where you may be exposed to hepatitis A.  Hepatitis B vaccine. You may need this if you have certain conditions or if you travel or work in places where you may be exposed to hepatitis B.  Haemophilus influenzae type b (Hib) vaccine. You may need this if you have certain risk factors. Talk to your health care provider about which  screenings and vaccines you need and how often you need them. This information is not intended to replace advice given to you by your health care provider. Make sure you discuss any questions you have with your health care provider. Document Released: 02/26/2015 Document Revised: 10/20/2015 Document Reviewed: 12/01/2014 Elsevier Interactive Patient Education  2017 [employer].

## 2016-04-14 ENCOUNTER — Telehealth: Payer: Self-pay | Admitting: *Deleted

## 2016-04-14 MED ORDER — LORAZEPAM 0.5 MG PO TABS: ORAL_TABLET | ORAL | 2 refills | Status: DC

## 2016-04-14 NOTE — Telephone Encounter (Signed)
Rx ativan refill printed. You can fax or pt can pick up.

## 2016-04-14 NOTE — Telephone Encounter (Signed)
Rx request faxed to pharmacy/SLS 03/02  

## 2016-04-14 NOTE — Telephone Encounter (Signed)
Faxed refill request received from [facility] for Lorazepam 0.5mg  Last filled by MD on 03/14/16, #30x0 Last AEX - 03/24/16 Next AEX - 1-Yr Please Advise on refills/SLS 03/02

## 2016-06-18 ENCOUNTER — Encounter: Payer: Self-pay | Admitting: Medical

## 2016-06-19 ENCOUNTER — Encounter: Payer: Self-pay | Admitting: Medical

## 2016-06-19 MED ORDER — VENLAFAXINE HCL ER 37.5 MG PO CP24: 37.5000 mg | ORAL_CAPSULE | Freq: Every day | ORAL | 0 refills | Status: DC

## 2016-06-19 NOTE — Telephone Encounter (Signed)
Rx effexor sent to the pharmacy.

## 2016-06-23 ENCOUNTER — Other Ambulatory Visit: Payer: Self-pay

## 2016-06-23 MED ORDER — LORAZEPAM 0.5 MG PO TABS: ORAL_TABLET | ORAL | 2 refills | Status: DC

## 2016-06-23 NOTE — Telephone Encounter (Signed)
Spoke with pharmacist at [facility]. Pharmacist states pt last rx was on 03/17/16.   Refill request: Lorazepam   Last RX: 04/14/16 Last OV:03/24/16 Next OV:06/29/16 UDS:01/10/13 CSC:09/30/15

## 2016-06-23 NOTE — Telephone Encounter (Signed)
Called pt and he did pick up effexor. Printed the ativan rx. Placed in on [place] desk to fax to his pharmacy on Monday.

## 2016-06-23 NOTE — Telephone Encounter (Signed)
I signed the rx. But before I give it to you to fax. Will you call [name] he uses and clarify if he has any refills left. I think he might have one. But pt states he does not?

## 2016-06-26 NOTE — Telephone Encounter (Signed)
Got the script and placing on your desk so we can fax over.

## 2016-06-26 NOTE — Telephone Encounter (Signed)
Rx faxed to pharmacy  

## 2016-06-26 NOTE — Telephone Encounter (Signed)
Spoke with pharmacist at [facility] pt has no more refills. Pt also stated he does not have any refills  Pt has a few pills left,

## 2016-06-29 ENCOUNTER — Ambulatory Visit: Payer: 59 | Admitting: Medical

## 2016-07-05 ENCOUNTER — Ambulatory Visit (INDEPENDENT_AMBULATORY_CARE_PROVIDER_SITE_OTHER): Payer: 59 | Admitting: Medical

## 2016-07-05 ENCOUNTER — Encounter: Payer: Self-pay | Admitting: Medical

## 2016-07-05 VITALS — BP 114/74 | HR 58 | Temp 98.2°F | Resp 14 | Ht 75.0 in | Wt 172.0 lb

## 2016-07-05 DIAGNOSIS — F411 Generalized anxiety disorder: Secondary | ICD-10-CM

## 2016-07-05 NOTE — Patient Instructions (Addendum)
For your anxiety recently increased can use ativan 1 tab a day. You report you did well with effexor but some potential significant effect on your libido. So you can hold effexor presently to see if libido normalizes. Note  also stress can effect libido so we will see how you respond to stopping effexor.  We can continue effexor next month if you want.  Follow up in end of June or early July.   If stress/anxiety related to work normalizes/decreases then please my chart me.

## 2016-07-05 NOTE — Progress Notes (Signed)
Subjective:    Patient ID: Mark [name], male    DOB: 09/07/1960, 56 y.o.   MRN: [id number]  HPI  Pt in states increased stress for about one month causing a lot of anxiety. Related to turnover at work. He now is working about twice the amount he was doing. 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  Pt in the past was taking clonazepam about 1 tab every other day. He started to take daily. So he wanted to talk to see what he should do.  I did send in effexor 37.5 mg a day. He states he feels like it mellowed him out. But still around noon he felt wired and anxious. He also notes significant decrease in libido about time he started effexor but he is not sure if decreased libido stress related.    Review of Systems  Constitutional: Negative for chills, diaphoresis and fever.  Respiratory: Negative for chest tightness, shortness of breath and wheezing.   Cardiovascular: Negative for chest pain and palpitations.  Gastrointestinal: Negative for abdominal pain.  Genitourinary: Negative for decreased urine volume, dysuria, frequency, genital sores, penile pain, penile swelling and testicular pain.       Decreased libido.  Skin: Negative for rash.  Neurological: Negative for dizziness and headaches. Speech difficulty:     Hematological: Negative for adenopathy. Does not bruise/bleed easily.  Psychiatric/Behavioral: Negative for behavioral problems, decreased concentration, hallucinations, self-injury, sleep disturbance and suicidal ideas. The patient is nervous/anxious.     Past Medical History:  Diagnosis Date  . Family history of prostate cancer     DRE WNL; PSA never > 0.38  . Fasting hyperglycemia     PMH of  . GERD (gastroesophageal reflux disease)   . Tinnitus    w/o hearing loss     Social History   Social History  . Marital status: Married    Spouse name: N/A  . Number of children: N/A  . Years of education: N/A   Occupational History    . Not on file.   Social History Main Topics  . Smoking status: Never Smoker  . Smokeless tobacco: Never Used  . Alcohol use Yes     Comment: very rare wine , < one per month per pt.  . Drug use: No  . Sexual activity: Yes   Other Topics Concern  . Not on file   Social History Narrative  . No narrative on file    Past Surgical History:  Procedure Laterality Date  . COLONOSCOPY  05/2013   Neg, Dr Marina Goodell  . KNEE SURGERY Right   . TONSILLECTOMY AND ADENOIDECTOMY    . VASECTOMY      Family History  Problem Relation Age of Onset  . COPD Mother        smoker  . Prostate cancer Father 58       no longer with prostate cancer  . Breast cancer Sister   . COPD Paternal Aunt        smoker  . Stroke Maternal Grandfather         late 74s  . Heart disease Neg Hx   . Colon cancer Neg Hx   . Esophageal cancer Neg Hx   . Pancreatic cancer Neg Hx   . Rectal cancer Neg Hx   . Stomach cancer Neg Hx   . Diabetes Neg Hx     Allergies  Allergen Reactions  . Penicillins     ? Reaction @  age 89    Current Outpatient Prescriptions on File Prior to Visit  Medication Sig Dispense Refill  . LORazepam (ATIVAN) 0.5 MG tablet TAKE ONE TABLET BY MOUTH TWICE A DAY AS NEEDED FOR ANXIETY 30 tablet 2  . Multiple Vitamins-Minerals (MENS MULTIVITAMIN PLUS) TABS Take by mouth daily.    . [DISCONTINUED] venlafaxine XR (EFFEXOR-XR) 37.5 MG 24 hr capsule Take 1 capsule (37.5 mg total) by mouth daily with breakfast. 30 capsule 0   No current facility-administered medications on file prior to visit.     BP 114/74 (BP Location: Right Arm, Patient Position: Sitting, Cuff Size: Normal)   Pulse (!) 58   Temp 98.2 F (36.8 C) (Oral)   Resp 14   Ht 6\' 3"  (1.905 m)   Wt 172 lb (78 kg)   SpO2 98%   BMI 21.50 kg/m       Objective:   Physical Exam   General- No acute distress. Pleasant patient. Neck- Full range of motion, no jvd Lungs- Clear, even and unlabored. Heart- regular rate and  rhythm. Neurologic- CNII- XII grossly intact.       Assessment & Plan:  For your anxiety recently increased can use ativan 1 tab a day. You report you did well with effexor but some potential significant effect on your libido. So you can hold effexor presently to see if libido normalizes. Note  also stress can effect libido so we will see how you respond to stopping effexor.  We can continue effexor next month if you want.  Follow up in end of June or early July.   If stress/anxiety related to work normalizes/decreases then please my chart me.  Girtie Wiersma, Ramon DredgeEdward, PA-C

## 2016-07-31 ENCOUNTER — Encounter: Payer: Self-pay | Admitting: Medical

## 2016-08-18 ENCOUNTER — Encounter: Payer: Self-pay | Admitting: Medical

## 2016-08-22 ENCOUNTER — Ambulatory Visit: Payer: Self-pay | Admitting: Medical

## 2016-08-23 ENCOUNTER — Ambulatory Visit (INDEPENDENT_AMBULATORY_CARE_PROVIDER_SITE_OTHER): Payer: 59 | Admitting: Medical

## 2016-08-23 ENCOUNTER — Encounter: Payer: Self-pay | Admitting: Medical

## 2016-08-23 VITALS — BP 110/70 | HR 64 | Temp 97.7°F | Resp 16 | Ht 75.0 in | Wt 170.4 lb

## 2016-08-23 DIAGNOSIS — N529 Male erectile dysfunction, unspecified: Secondary | ICD-10-CM

## 2016-08-23 DIAGNOSIS — R6882 Decreased libido: Secondary | ICD-10-CM | POA: Diagnosis not present

## 2016-08-23 DIAGNOSIS — R5383 Other fatigue: Secondary | ICD-10-CM | POA: Diagnosis not present

## 2016-08-23 MED ORDER — SILDENAFIL CITRATE 100 MG PO TABS: 100.0000 mg | ORAL_TABLET | Freq: Every day | ORAL | 0 refills | Status: DC | PRN

## 2016-08-23 NOTE — Progress Notes (Signed)
Subjective:    Patient ID: Mark [name], male    DOB: 08-15-1960, 56 y.o.   MRN: [id number]  HPI  Pt in states he stopped taking the SSRI med. He is only using ativan for his anxiety. Work still and issue but he is handling it.   Pt in past had concern ssri he was taking was causing ED. But he has been off of med and he still has decreased libido and some ED.  Pt wants to have testosterone level done. On review psa in past/this year was normal.  Pt feels fatigued some but thinks related to life style very active and working a lot.    Review of Systems  Constitutional: Negative for chills, fatigue and fever.  Respiratory: Negative for chest tightness, shortness of breath and stridor.   Cardiovascular: Negative for chest pain and palpitations.  Gastrointestinal: Negative for abdominal distention, abdominal pain, constipation, diarrhea and vomiting.  Genitourinary: Negative for decreased urine volume, dysuria, enuresis, flank pain, genital sores, penile pain, penile swelling and testicular pain.       Erectile dysfunction.  Musculoskeletal: Negative for back pain, joint swelling and neck pain.  Skin: Negative for rash.  Neurological: Negative for dizziness, syncope, facial asymmetry, speech difficulty, weakness, numbness and headaches.  Hematological: Negative for adenopathy. Does not bruise/bleed easily.  Psychiatric/Behavioral: Negative for behavioral problems, confusion and sleep disturbance. The patient is not nervous/anxious.     Past Medical History:  Diagnosis Date  . Family history of prostate cancer     DRE WNL; PSA never > 0.38  . Fasting hyperglycemia     PMH of  . GERD (gastroesophageal reflux disease)   . Tinnitus    w/o hearing loss     Social History   Social History  . Marital status: Married    Spouse name: N/A  . Number of children: N/A  . Years of education: N/A   Occupational History  . Not on file.   Social History Main Topics  . Smoking  status: Never Smoker  . Smokeless tobacco: Never Used  . Alcohol use Yes     Comment: very rare wine , < one per month per pt.  . Drug use: No  . Sexual activity: Yes   Other Topics Concern  . Not on file   Social History Narrative  . No narrative on file    Past Surgical History:  Procedure Laterality Date  . COLONOSCOPY  05/2013   Neg, Dr [name]  . KNEE SURGERY Right   . TONSILLECTOMY AND ADENOIDECTOMY    . VASECTOMY      Family History  Problem Relation Age of Onset  . COPD Mother        smoker  . Prostate cancer Father 6770       no longer with prostate cancer  . Breast cancer Sister   . COPD Paternal Aunt        smoker  . Stroke Maternal Grandfather         late 5460s  . Heart disease Neg Hx   . Colon cancer Neg Hx   . Esophageal cancer Neg Hx   . Pancreatic cancer Neg Hx   . Rectal cancer Neg Hx   . Stomach cancer Neg Hx   . Diabetes Neg Hx     Allergies  Allergen Reactions  . Penicillins     ? Reaction @ age 89    Current Outpatient Prescriptions on File Prior to Visit  Medication Sig Dispense  Refill  . LORazepam (ATIVAN) 0.5 MG tablet TAKE ONE TABLET BY MOUTH TWICE A DAY AS NEEDED FOR ANXIETY 30 tablet 2  . Multiple Vitamins-Minerals (MENS MULTIVITAMIN PLUS) TABS Take by mouth daily.    . [DISCONTINUED] venlafaxine XR (EFFEXOR-XR) 37.5 MG 24 hr capsule Take 1 capsule (37.5 mg total) by mouth daily with breakfast. 30 capsule 0   No current facility-administered medications on file prior to visit.     BP 110/70 (BP Location: Right Arm, Cuff Size: Normal)   Pulse 64   Temp 97.7 F (36.5 C) (Oral)   Resp 16   Ht 6\' 3"  (1.905 m)   Wt 170 lb 6.4 oz (77.3 kg)   SpO2 98%   BMI 21.30 kg/m   .     Objective:   Physical Exam  General- No acute distress. Pleasant patient. Neck- Full range of motion, no jvd Lungs- Clear, even and unlabored. Heart- regular rate and rhythm. Neurologic- CNII- XII grossly intact.  Abdomen-soft, nt, nd,+bs, no rebound  or guarding. Back- no cva tenderness.      Assessment & Plan:  For your anxiety continue ativan.  For your decreased libido and erectile dysfunction will get testosterone panel but in early morning. Please schedule on way out today.  Rx viagra tablets.   For fatigue add b12 and b1 lab as well.  Follow up date to be determined after labs. Also could update me if med works adequately.  Damonie Ellenwood, Ramon Dredge, PA-C

## 2016-08-23 NOTE — Patient Instructions (Signed)
For your anxiety continue ativan.  For your decreased libido and erectile dysfunction will get testosterone panel but in early morning. Please schedule on way out today.  Rx viagra tablets.   For fatigue add b12 and b1 lab as well.  Follow up date to be determined after labs. Also could update me if med works adequately.

## 2016-08-24 ENCOUNTER — Other Ambulatory Visit (INDEPENDENT_AMBULATORY_CARE_PROVIDER_SITE_OTHER): Payer: 59

## 2016-08-24 DIAGNOSIS — N529 Male erectile dysfunction, unspecified: Secondary | ICD-10-CM

## 2016-08-24 DIAGNOSIS — R6882 Decreased libido: Secondary | ICD-10-CM | POA: Diagnosis not present

## 2016-08-24 DIAGNOSIS — R5383 Other fatigue: Secondary | ICD-10-CM | POA: Diagnosis not present

## 2016-08-24 LAB — VITAMIN B12: Vitamin B-12: 365 pg/mL (ref 211–911)

## 2016-08-24 LAB — TESTOSTERONE: TESTOSTERONE: 392.59 ng/dL (ref 300.00–890.00)

## 2016-08-25 LAB — SEX HORMONE BINDING GLOBULIN: Sex Hormone Binding: 67 nmol/L — ABNORMAL HIGH (ref 10–50)

## 2016-08-28 LAB — VITAMIN B1: VITAMIN B1 (THIAMINE): 16 nmol/L (ref 8–30)

## 2016-08-29 LAB — TESTOSTERONE: TESTOSTERONE: 547 ng/dL (ref 250–827)

## 2016-08-29 LAB — TESTOSTERONE, % FREE: Testosterone-% Free: 1.3 % — ABNORMAL LOW (ref 1.6–2.9)

## 2016-08-29 LAB — TESTOSTERONE, FREE: TESTOSTERONE FREE: 70.8 pg/mL (ref 47.0–244.0)

## 2016-08-31 ENCOUNTER — Encounter: Payer: Self-pay | Admitting: Medical

## 2016-09-13 ENCOUNTER — Other Ambulatory Visit: Payer: Self-pay | Admitting: Medical

## 2016-09-14 ENCOUNTER — Telehealth: Payer: Self-pay | Admitting: Medical

## 2016-09-14 NOTE — Telephone Encounter (Signed)
Will sign rx and he can pick. It looks like he needs updated controlled med contract and uds. If so have him sign and give uds within a week.

## 2016-09-14 NOTE — Telephone Encounter (Signed)
Refill Request: Lorazepam  Last RX:06/23/16 Last OV: 08/23/16 Next OV: None scheduled UDS:01/10/13 CSC:11//25/14

## 2016-09-14 NOTE — Telephone Encounter (Signed)
Notified pt rx is ready for pick up at front desk

## 2016-09-18 NOTE — Telephone Encounter (Signed)
error 

## 2016-10-05 ENCOUNTER — Encounter: Payer: Self-pay | Admitting: Medical

## 2016-10-05 ENCOUNTER — Ambulatory Visit (INDEPENDENT_AMBULATORY_CARE_PROVIDER_SITE_OTHER): Payer: 59 | Admitting: Medical

## 2016-10-05 VITALS — BP 107/70 | HR 55 | Temp 97.9°F | Resp 16 | Ht 74.0 in | Wt 173.4 lb

## 2016-10-05 DIAGNOSIS — H00011 Hordeolum externum right upper eyelid: Secondary | ICD-10-CM

## 2016-10-05 MED ORDER — TOBRAMYCIN 0.3 % OP SOLN: 2.0000 [drp] | Freq: Four times a day (QID) | OPHTHALMIC | 0 refills | Status: DC

## 2016-10-05 NOTE — Patient Instructions (Signed)
You do appear to have stye to right eye. Warm compresses twice daily and rx tobrex. The area should gradually.   Follow up 7 days or as needed.  Stye A stye is a bump on your eyelid caused by a bacterial infection. A stye can form inside the eyelid (internal stye) or outside the eyelid (external stye). An internal stye may be caused by an infected oil-producing gland inside your eyelid. An external stye may be caused by an infection at the base of your eyelash (hair follicle). Styes are very common. Anyone can get them at any age. They usually occur in just one eye, but you may have more than one in either eye. What are the causes? The infection is almost always caused by bacteria called Staphylococcus aureus. This is a common type of bacteria that lives on your skin. What increases the risk? You may be at higher risk for a stye if you have had one before. You may also be at higher risk if you have:  Diabetes.  Long-term illness.  Long-term eye redness.  A skin condition called seborrhea.  High fat levels in your blood (lipids).  What are the signs or symptoms? Eyelid pain is the most common symptom of a stye. Internal styes are more painful than external styes. Other signs and symptoms may include:  Painful swelling of your eyelid.  A scratchy feeling in your eye.  Tearing and redness of your eye.  Pus draining from the stye.  How is this diagnosed? Your health care provider may be able to diagnose a stye just by examining your eye. The health care provider may also check to make sure:  You do not have a fever or other signs of a more serious infection.  The infection has not spread to other parts of your eye or areas around your eye.  How is this treated? Most styes will clear up in a few days without treatment. In some cases, you may need to use antibiotic drops or ointment to prevent infection. Your health care provider may have to drain the stye surgically if your stye  is:  Large.  Causing a lot of pain.  Interfering with your vision.  This can be done using a thin blade or a needle. Follow these instructions at home:  Take medicines only as directed by your health care provider.  Apply a clean, warm compress to your eye for 10 minutes, 4 times a day.  Do not wear contact lenses or eye makeup until your stye has healed.  Do not try to pop or drain the stye. Contact a health care provider if:  You have chills or a fever.  Your stye does not go away after several days.  Your stye affects your vision.  Your eyeball becomes swollen, red, or painful. This information is not intended to replace advice given to you by your health care provider. Make sure you discuss any questions you have with your health care provider. Document Released: 11/09/2004 Document Revised: 09/26/2015 Document Reviewed: 05/16/2013 Elsevier Interactive Patient Education  [employer].

## 2016-10-05 NOTE — Progress Notes (Signed)
Subjective:    Patient ID: Mark [name], male    DOB: 07-07-1960, 56 y.o.   MRN: [id number]  HPI  Small lump/bump in rt upper eye lid area near lash. Present for 4-5 days no trauma. No insect bite. No vision changes. No eye dc. Faint irritation. Just mild irritation. No hx of stye.     Review of Systems  Constitutional: Negative for chills, fatigue and fever.  Eyes: Negative for photophobia, discharge, redness, itching and visual disturbance.       Stye  Respiratory: Negative for cough, chest tightness, shortness of breath and wheezing.   Cardiovascular: Negative for chest pain and palpitations.  Skin: Negative for rash.  Neurological: Negative for dizziness, seizures, light-headedness and numbness.       Objective:   Physical Exam   General- No acute distress. Pleasant patient. Neck- Full range of motion, no jvd Lungs- Clear, even and unlabored. Heart- regular rate and rhythm. Neurologic- CNII- XII grossly intact.  Eyes- Perrla , EOM intact. Rt upper eye lid small tiny stye. No dc. Both eyes conjunctiva clear.    Past Medical History:  Diagnosis Date  . Family history of prostate cancer     DRE WNL; PSA never > 0.38  . Fasting hyperglycemia     PMH of  . GERD (gastroesophageal reflux disease)   . Tinnitus    w/o hearing loss     Social History   Social History  . Marital status: Married    Spouse name: N/A  . Number of children: N/A  . Years of education: N/A   Occupational History  . Not on file.   Social History Main Topics  . Smoking status: Never Smoker  . Smokeless tobacco: Never Used  . Alcohol use Yes     Comment: very rare wine , < one per month per pt.  . Drug use: No  . Sexual activity: Yes   Other Topics Concern  . Not on file   Social History Narrative  . No narrative on file    Past Surgical History:  Procedure Laterality Date  . COLONOSCOPY  05/2013   Neg, Dr Marina Goodell  . KNEE SURGERY Right   . TONSILLECTOMY AND  ADENOIDECTOMY    . VASECTOMY      Family History  Problem Relation Age of Onset  . COPD Mother        smoker  . Prostate cancer Father 69       no longer with prostate cancer  . Breast cancer Sister   . COPD Paternal Aunt        smoker  . Stroke Maternal Grandfather         late 39s  . Heart disease Neg Hx   . Colon cancer Neg Hx   . Esophageal cancer Neg Hx   . Pancreatic cancer Neg Hx   . Rectal cancer Neg Hx   . Stomach cancer Neg Hx   . Diabetes Neg Hx     Allergies  Allergen Reactions  . Penicillins     ? Reaction @ age 57    Current Outpatient Prescriptions on File Prior to Visit  Medication Sig Dispense Refill  . LORazepam (ATIVAN) 0.5 MG tablet TAKE ONE TABLET BY MOUTH TWICE A DAY AS NEEDED FOR ANXIETY 30 tablet 1  . Multiple Vitamins-Minerals (MENS MULTIVITAMIN PLUS) TABS Take by mouth daily.    . sildenafil (VIAGRA) 100 MG tablet Take 1 tablet (100 mg total) by mouth daily as needed for  erectile dysfunction. 10 tablet 0  . [DISCONTINUED] venlafaxine XR (EFFEXOR-XR) 37.5 MG 24 hr capsule Take 1 capsule (37.5 mg total) by mouth daily with breakfast. 30 capsule 0   No current facility-administered medications on file prior to visit.     BP 107/70   Pulse (!) 55   Temp 97.9 F (36.6 C) (Oral)   Resp 16   Ht 6\' 2"  (1.88 m)   Wt 173 lb 6.4 oz (78.7 kg)   SpO2 100%   BMI 22.26 kg/m        Assessment & Plan:  You do appear to have stye to right eye. Warm compresses twice daily and rx tobrex. The area should gradually.   Follow up 7 days or as needed.  Artis Buechele, Ramon Dredge, PA-C

## 2016-11-08 ENCOUNTER — Other Ambulatory Visit: Payer: Self-pay | Admitting: Medical

## 2016-11-08 NOTE — Telephone Encounter (Signed)
Requesting: Lorazepam Contract: Yes 8.6.18 UDS: None Last OV: 8.23.18 Next OV: Not scheduled Last Refill: 8.31.18   Please advise

## 2016-11-08 NOTE — Telephone Encounter (Signed)
It appears they gave him prescription for Ativan on a 09/14/2016. I gave him 1 refill. So I think his current prescription should last until October 2. Is he out completely? I give give him 30 tablets for month and typically he does well with this. Would you ask him if he is completely out and does he have enough to last until October 2.  Let me know what he says. I also could change the number for the following month. Maybe give him 45 if he is running out too soon. Ideally I just want to give him enough tablets to last 1 month and not have him run out.

## 2016-11-09 ENCOUNTER — Telehealth: Payer: Self-pay | Admitting: Medical

## 2016-11-09 NOTE — Telephone Encounter (Signed)
Rx writen on 8.31.18 was given for 30d with 1 refill/is pt out? Sent this to pharm/thx dmf

## 2016-11-09 NOTE — Telephone Encounter (Signed)
I am thinking that the patient should have a refill. So please call him and explained this or verify with the pharmacy. Thanks

## 2016-11-13 ENCOUNTER — Telehealth: Payer: Self-pay | Admitting: Medical

## 2016-11-13 ENCOUNTER — Other Ambulatory Visit: Payer: Self-pay | Admitting: Medical

## 2016-11-13 ENCOUNTER — Encounter: Payer: Self-pay | Admitting: Medical

## 2016-11-13 MED ORDER — LORAZEPAM 0.5 MG PO TABS: ORAL_TABLET | ORAL | 3 refills | Status: DC

## 2016-11-13 NOTE — Telephone Encounter (Signed)
Opened to review 

## 2016-11-13 NOTE — Telephone Encounter (Signed)
I refilled patient's Ativan. Will place it on staff desk for it to be faxed tomorrow.

## 2016-11-14 NOTE — Telephone Encounter (Signed)
Faxed to pharm/pt aware/thx dmf

## 2016-12-27 ENCOUNTER — Ambulatory Visit: Payer: Self-pay | Admitting: Medical

## 2017-02-15 ENCOUNTER — Encounter: Payer: Self-pay | Admitting: Medical

## 2017-03-06 ENCOUNTER — Encounter: Payer: Self-pay | Admitting: Medical

## 2017-03-06 ENCOUNTER — Ambulatory Visit: Payer: 59 | Admitting: Medical

## 2017-03-06 VITALS — BP 120/70 | HR 65 | Temp 97.7°F | Resp 16 | Ht 74.0 in | Wt 170.6 lb

## 2017-03-06 DIAGNOSIS — F419 Anxiety disorder, unspecified: Secondary | ICD-10-CM

## 2017-03-06 DIAGNOSIS — Z79899 Other long term (current) drug therapy: Secondary | ICD-10-CM

## 2017-03-06 MED ORDER — LORAZEPAM 0.5 MG PO TABS: 0.5000 mg | ORAL_TABLET | Freq: Two times a day (BID) | ORAL | 3 refills | Status: DC

## 2017-03-06 NOTE — Patient Instructions (Signed)
You have recent increase of anxiety.  You are using  more than have historically used in the past.  I do think I will go ahead and increase the number of tablets per month to allow you some flexibility in order that you do not run out before each month.  I did try to explain to pharmacist on your prescription that the instructions was for twice a day but I have only given you #30 tab volume.  This time I am giving you 60 tablets each month.  Follow-up in 4 months or as needed.

## 2017-03-06 NOTE — Progress Notes (Signed)
Subjective:    Patient ID: Mark [name], male    DOB: 1960/12/20, 57 y.o.   MRN: [id number]  HPI  Pt in for follow up.  He states work still hectic. He did hire someone to help him with work load.  Pt states he has been taking ativan 1- 1.5 tablets a day. He states anxiety has gotten more intense. He really just wants to take one tablet a day but needs to control anxiety.  He is still running/jogging about 3-3.5 about 4 days a week.  Pt does not get flu vaccine every year. Pt declines flu vaccine.  Pt has about 3 tabs of ativan left.   In the past I had given him ssri type med but had side effect ED.       Review of Systems  Constitutional: Negative for chills, fatigue and fever.  HENT: Negative for congestion, ear pain, nosebleeds and postnasal drip.   Respiratory: Negative for cough, chest tightness, shortness of breath and wheezing.   Cardiovascular: Negative for chest pain and palpitations.  Gastrointestinal: Negative for abdominal distention, abdominal pain, blood in stool, nausea and vomiting.  Musculoskeletal: Negative for back pain and myalgias.  Skin: Negative for rash.  Neurological: Negative for dizziness, weakness, light-headedness and numbness.  Hematological: Negative for adenopathy. Does not bruise/bleed easily.  Psychiatric/Behavioral: Negative for behavioral problems, confusion, dysphoric mood and sleep disturbance. The patient is nervous/anxious.     Past Medical History:  Diagnosis Date  . Family history of prostate cancer     DRE WNL; PSA never > 0.38  . Fasting hyperglycemia     PMH of  . GERD (gastroesophageal reflux disease)   . Tinnitus    w/o hearing loss     Social History   Socioeconomic History  . Marital status: Married    Spouse name: Not on file  . Number of children: Not on file  . Years of education: Not on file  . Highest education level: Not on file  Social Needs  . Financial resource strain: Not on file  . Food  insecurity - worry: Not on file  . Food insecurity - inability: Not on file  . Transportation needs - medical: Not on file  . Transportation needs - non-medical: Not on file  Occupational History  . Not on file  Tobacco Use  . Smoking status: Never Smoker  . Smokeless tobacco: Never Used  Substance and Sexual Activity  . Alcohol use: Yes    Comment: very rare wine , < one per month per pt.  . Drug use: No  . Sexual activity: Yes  Other Topics Concern  . Not on file  Social History Narrative  . Not on file    Past Surgical History:  Procedure Laterality Date  . COLONOSCOPY  05/2013   Neg, Dr Marina Goodell  . KNEE SURGERY Right   . TONSILLECTOMY AND ADENOIDECTOMY    . VASECTOMY      Family History  Problem Relation Age of Onset  . COPD Mother        smoker  . Prostate cancer Father 21       no longer with prostate cancer  . Breast cancer Sister   . COPD Paternal Aunt        smoker  . Stroke Maternal Grandfather         late 67s  . Heart disease Neg Hx   . Colon cancer Neg Hx   . Esophageal cancer Neg Hx   .  Pancreatic cancer Neg Hx   . Rectal cancer Neg Hx   . Stomach cancer Neg Hx   . Diabetes Neg Hx     Allergies  Allergen Reactions  . Penicillins     ? Reaction @ age 89    Current Outpatient Medications on File Prior to Visit  Medication Sig Dispense Refill  . LORazepam (ATIVAN) 0.5 MG tablet TAKE ONE TABLET BY MOUTH TWICE A DAY AS NEEDED FOR ANXIETY 30 tablet 3  . Multiple Vitamins-Minerals (MENS MULTIVITAMIN PLUS) TABS Take by mouth daily.    . sildenafil (VIAGRA) 100 MG tablet Take 1 tablet (100 mg total) by mouth daily as needed for erectile dysfunction. 10 tablet 0  . [DISCONTINUED] venlafaxine XR (EFFEXOR-XR) 37.5 MG 24 hr capsule Take 1 capsule (37.5 mg total) by mouth daily with breakfast. 30 capsule 0   No current facility-administered medications on file prior to visit.     BP 120/70   Pulse 65   Temp 97.7 F (36.5 C) (Oral)   Resp 16   Ht 6\' 2"   (1.88 m)   Wt 170 lb 9.6 oz (77.4 kg)   SpO2 100%   BMI 21.90 kg/m       Objective:   Physical Exam  General Mental Status- Alert. General Appearance- Not in acute distress.   Chest and Lung Exam Auscultation: Breath Sounds:-Normal.  Cardiovascular Auscultation:Rythm- Regular. Murmurs & Other Heart Sounds:Auscultation of the heart reveals- No Murmurs.  Neurologic Cranial Nerve exam:- CN III-XII intact(No nystagmus), symmetric smile. Strength:- 5/5 equal and symmetric strength both upper and lower extremities.      Assessment & Plan:  You have recent increase of anxiety.  You are using  more than have historically used in the past.  I do think I will go ahead and increase the number of tablets per month to allow you some flexibility in order that you do not run out before each month.  I did try to explain to pharmacist on your prescription that the instructions was for twice a day but I have only given you #30 tab volume.  This time I am giving you 60 tablets each month.  Follow-up in 4 months or as needed.  Patient did get UDS today.  He is up-to-date on his contract and I did [place]

## 2017-03-13 LAB — PAIN MGMT, PROFILE 8 W/CONF, U
6 Acetylmorphine: NEGATIVE ng/mL (ref <10)
ALCOHOL METABOLITES: POSITIVE ng/mL — AB (ref <500)
ALPHAHYDROXYALPRAZOLAM: NEGATIVE ng/mL (ref <25)
AMINOCLONAZEPAM: NEGATIVE ng/mL (ref <25)
Alphahydroxymidazolam: NEGATIVE ng/mL (ref <50)
Alphahydroxytriazolam: NEGATIVE ng/mL (ref <50)
Amphetamines: NEGATIVE ng/mL (ref <500)
BUPRENORPHINE, URINE: NEGATIVE ng/mL (ref <5)
Benzodiazepines: POSITIVE ng/mL — AB (ref <100)
COCAINE METABOLITE: NEGATIVE ng/mL (ref <150)
CREATININE: 54.4 mg/dL
Ethyl Glucuronide (ETG): NEGATIVE ng/mL (ref <500)
Ethyl Sulfate (ETS): 206 ng/mL — ABNORMAL HIGH (ref <100)
Hydroxyethylflurazepam: NEGATIVE ng/mL (ref <50)
LORAZEPAM: 121 ng/mL — AB (ref <50)
MDMA: NEGATIVE ng/mL (ref <500)
Marijuana Metabolite: NEGATIVE ng/mL (ref <20)
Nordiazepam: NEGATIVE ng/mL (ref <50)
OXIDANT: NEGATIVE ug/mL (ref <200)
Opiates: NEGATIVE ng/mL (ref <100)
Oxazepam: NEGATIVE ng/mL (ref <50)
Oxycodone: NEGATIVE ng/mL (ref <100)
PH: 7.83 (ref 4.5–9.0)
TEMAZEPAM: NEGATIVE ng/mL (ref <50)

## 2017-03-25 ENCOUNTER — Encounter: Payer: Self-pay | Admitting: Medical

## 2017-03-26 ENCOUNTER — Ambulatory Visit (HOSPITAL_BASED_OUTPATIENT_CLINIC_OR_DEPARTMENT_OTHER)
Admission: RE | Admit: 2017-03-26 | Discharge: 2017-03-26 | Disposition: A | Discharge status: Home / Self Care | Payer: 59 | Source: Ambulatory Visit | Attending: Medical | Admitting: Medical

## 2017-03-26 ENCOUNTER — Ambulatory Visit (INDEPENDENT_AMBULATORY_CARE_PROVIDER_SITE_OTHER): Payer: 59 | Admitting: Medical

## 2017-03-26 ENCOUNTER — Encounter: Payer: Self-pay | Admitting: Medical

## 2017-03-26 VITALS — BP 112/90 | HR 57 | Temp 97.7°F | Resp 16 | Ht 72.25 in | Wt 173.2 lb

## 2017-03-26 DIAGNOSIS — Z0001 Encounter for general adult medical examination with abnormal findings: Secondary | ICD-10-CM | POA: Diagnosis not present

## 2017-03-26 DIAGNOSIS — M542 Cervicalgia: Secondary | ICD-10-CM | POA: Diagnosis not present

## 2017-03-26 DIAGNOSIS — M47812 Spondylosis without myelopathy or radiculopathy, cervical region: Secondary | ICD-10-CM | POA: Insufficient documentation

## 2017-03-26 DIAGNOSIS — Z125 Encounter for screening for malignant neoplasm of prostate: Secondary | ICD-10-CM | POA: Diagnosis not present

## 2017-03-26 DIAGNOSIS — K117 Disturbances of salivary secretion: Secondary | ICD-10-CM

## 2017-03-26 DIAGNOSIS — Z23 Encounter for immunization: Secondary | ICD-10-CM

## 2017-03-26 DIAGNOSIS — Z0000 Encounter for general adult medical examination without abnormal findings: Secondary | ICD-10-CM

## 2017-03-26 LAB — URINALYSIS, ROUTINE W REFLEX MICROSCOPIC
Bilirubin Urine: NEGATIVE
Hgb urine dipstick: NEGATIVE
KETONES UR: NEGATIVE
Leukocytes, UA: NEGATIVE
Nitrite: NEGATIVE
RBC / HPF: NONE SEEN (ref 0–?)
SPECIFIC GRAVITY, URINE: 1.01 (ref 1.000–1.030)
Total Protein, Urine: NEGATIVE
UROBILINOGEN UA: 0.2 (ref 0.0–1.0)
Urine Glucose: NEGATIVE
WBC, UA: NONE SEEN (ref 0–?)
pH: 7.5 (ref 5.0–8.0)

## 2017-03-26 LAB — PSA: PSA: 0.35 ng/mL (ref 0.10–4.00)

## 2017-03-26 NOTE — Addendum Note (Signed)
Addended by: Mervin KungFERGERSON, Antonina Deziel A on: 03/26/2017 02:07 PM   Modules accepted: Orders

## 2017-03-26 NOTE — Progress Notes (Signed)
Subjective:    Patient ID: Mark [name], male    DOB: [birth year], 57 y.o.   MRN: [id number]  HPI  Pt in for CPE and fasting.  Will get flu vaccine.  He is up to date on colonoscopy.  See ROS for his recent acute complaints.   Review of Systems  Constitutional: Negative for chills, diaphoresis, fatigue and fever.  HENT: Negative for ear discharge, ear pain, facial swelling, hearing loss, postnasal drip, rhinorrhea, sinus pressure, sneezing and sore throat.        Some recent increased saliva. Going on for a couple of months. Pt states his heart burn has been under control. No allergy symptoms recently. No recent dental work.   Respiratory: Negative for cough, chest tightness, shortness of breath and wheezing.   Cardiovascular: Negative for chest pain and palpitations.  Gastrointestinal: Negative for abdominal pain.  Genitourinary: Negative for dysuria, flank pain, frequency, penile pain, penile swelling and urgency.  Musculoskeletal: Negative for back pain, neck pain and neck stiffness.       Hx of some left hand grip strength of hand is less than right. Pt states years ago he fell and landed on his thoracic spine and may have suffered some injury to t1area.  Pt has friend who is a PT.  Skin: Negative for rash.  Neurological: Negative for dizziness, syncope, weakness and headaches.  Hematological: Negative for adenopathy. Does not bruise/bleed easily.  Psychiatric/Behavioral: Negative for behavioral problems, confusion, dysphoric mood, self-injury and sleep disturbance. The patient is not nervous/anxious.     Past Medical History:  Diagnosis Date  . Family history of prostate cancer     DRE WNL; PSA never > 0.38  . Fasting hyperglycemia     PMH of  . GERD (gastroesophageal reflux disease)   . Tinnitus    w/o hearing loss     Social History   Socioeconomic History  . Marital status: Married    Spouse name: Not on file  . Number of children: Not on file  . Years of  education: Not on file  . Highest education level: Not on file  Social Needs  . Financial resource strain: Not on file  . Food insecurity - worry: Not on file  . Food insecurity - inability: Not on file  . Transportation needs - medical: Not on file  . Transportation needs - non-medical: Not on file  Occupational History  . Not on file  Tobacco Use  . Smoking status: Never Smoker  . Smokeless tobacco: Never Used  Substance and Sexual Activity  . Alcohol use: Yes    Comment: very rare wine , < one per month per pt.  . Drug use: No  . Sexual activity: Yes  Other Topics Concern  . Not on file  Social History Narrative  . Not on file    Past Surgical History:  Procedure Laterality Date  . COLONOSCOPY  05/2013   Neg, Dr Marina Goodell  . KNEE SURGERY Right   . TONSILLECTOMY AND ADENOIDECTOMY    . VASECTOMY      Family History  Problem Relation Age of Onset  . COPD Mother        smoker  . Prostate cancer Father 57       no longer with prostate cancer  . Breast cancer Sister   . COPD Paternal Aunt        smoker  . Stroke Maternal Grandfather         late 87s  . Heart  disease Neg Hx   . Colon cancer Neg Hx   . Esophageal cancer Neg Hx   . Pancreatic cancer Neg Hx   . Rectal cancer Neg Hx   . Stomach cancer Neg Hx   . Diabetes Neg Hx     Allergies  Allergen Reactions  . Penicillins     ? Reaction @ age 89    Current Outpatient Medications on File Prior to Visit  Medication Sig Dispense Refill  . LORazepam (ATIVAN) 0.5 MG tablet Take 1 tablet (0.5 mg total) by mouth 2 (two) times daily. Prior rx sig was twice a day as needed but gave lower quantity #30. Increasing number. Did not give adequate volume to cover. Anxiety increasing and has 3 tabs left. Soon to run out. 60 tablet 3  . Multiple Vitamins-Minerals (MENS MULTIVITAMIN PLUS) TABS Take by mouth daily.    . sildenafil (VIAGRA) 100 MG tablet Take 1 tablet (100 mg total) by mouth daily as needed for erectile dysfunction.  10 tablet 0  . [DISCONTINUED] venlafaxine XR (EFFEXOR-XR) 37.5 MG 24 hr capsule Take 1 capsule (37.5 mg total) by mouth daily with breakfast. 30 capsule 0   No current facility-administered medications on file prior to visit.     BP 112/90 (BP Location: Left Arm, Cuff Size: Normal)   Pulse (!) 57   Temp 97.7 F (36.5 C) (Oral)   Resp 16   Ht 6' 0.25" (1.835 m)   Wt 173 lb 3.2 oz (78.6 kg)   BMI 23.33 kg/m       Objective:   Physical Exam  General Mental Status- Alert. General Appearance- Not in acute distress.   Skin General: Color- Normal Color. Moisture- Normal Moisture.  Tiny scattered moles on back.  None appear worrisome.  Neck Carotid Arteries- Normal color. Moisture- Normal Moisture. No carotid bruits. No JVD. No obvoius pain on palpation of neck.   Chest and Lung Exam Auscultation: Breath Sounds:-Normal.  Cardiovascular Auscultation:Rythm- Regular. Murmurs & Other Heart Sounds:Auscultation of the heart reveals- No Murmurs.  Abdomen Inspection:-Inspeection Normal. Palpation/Percussion:Note:No mass. Palpation and Percussion of the abdomen reveal- Non Tender, Non Distended + BS, no rebound or guarding.    Neurologic Cranial Nerve exam:- CN III-XII intact(No nystagmus), symmetric smile. Strength:- 5/5 equal and symmetric strength both upper and lower extremities. Left hand grip- 4/5. Rt side grip 5/5.  Rectal exam- declined by patient.   HEENT Head- Normal. Ear Auditory Canal - Left- Normal. Right - Normal.Tympanic Membrane- Left- Normal. Right- Normal. Eye Sclera/Conjunctiva- Left- Normal. Right- Normal. Nose & Sinuses Nasal Mucosa- Left-  Boggy and Congested. Right-  Boggy and  Congested.Bilateral no  maxillary and   nofrontal sinus pressure. Mouth & Throat Lips: Upper Lip- Normal: no dryness, cracking, pallor, cyanosis, or vesicular eruption. Lower Lip-Normal: no dryness, cracking, pallor, cyanosis or vesicular eruption. Buccal Mucosa- Bilateral- No  Aphthous ulcers. Oropharynx- No Discharge or Erythema. Tonsils: Characteristics- Bilateral- No Erythema or Congestion. Size/Enlargement- Bilateral- No enlargement. Discharge- bilateral-None.      Assessment & Plan:  For you wellness exam today I have ordered cbc, cmp, lipid panel, and ua.  Vaccine given today flu vaccine  Recommend exercise and healthy diet.  We will let you know lab results as they come in.  Follow up date appointment will be determined after lab review.  Will get xray of your cspine to assess joint spaces.  For recent excess salivation will refer to ENT.   Esperanza RichtersEdward Shaila Gilchrest, PA-C

## 2017-03-26 NOTE — Patient Instructions (Addendum)
For you wellness exam today I have ordered cbc, cmp, lipid panel, and ua.   Vaccine given today flu vaccine  Recommend exercise and healthy diet.  We will let you know lab results as they come in.  Follow up date appointment will be determined after lab review.   Will get xray of your cspine to assess joint spaces.  For recent excess salivation will refer to ENT.(on interview and exam no obvious cause found)    Preventive Care 40-64 Years, Male Preventive care refers to lifestyle choices and visits with your health care provider that can promote health and wellness. What does preventive care include?  A yearly physical exam. This is also called an annual well check.  Dental exams once or twice a year.  Routine eye exams. Ask your health care provider how often you should have your eyes checked.  Personal lifestyle choices, including: ? Daily care of your teeth and gums. ? Regular physical activity. ? Eating a healthy diet. ? Avoiding tobacco and drug use. ? Limiting alcohol use. ? Practicing safe sex. ? Taking low-dose aspirin every day starting at age 67. What happens during an annual well check? The services and screenings done by your health care provider during your annual well check will depend on your age, overall health, lifestyle risk factors, and family history of disease. Counseling Your health care provider may ask you questions about your:  Alcohol use.  Tobacco use.  Drug use.  Emotional well-being.  Home and relationship well-being.  Sexual activity.  Eating habits.  Work and work Statistician.  Screening You may have the following tests or measurements:  Height, weight, and BMI.  Blood pressure.  Lipid and cholesterol levels. These may be checked every 5 years, or more frequently if you are over 80 years old.  Skin check.  Lung cancer screening. You may have this screening every year starting at age 87 if you have a 30-pack-year history  of smoking and currently smoke or have quit within the past 15 years.  Fecal occult blood test (FOBT) of the stool. You may have this test every year starting at age 35.  Flexible sigmoidoscopy or colonoscopy. You may have a sigmoidoscopy every 5 years or a colonoscopy every 10 years starting at age 72.  Prostate cancer screening. Recommendations will vary depending on your family history and other risks.  Hepatitis C blood test.  Hepatitis B blood test.  Sexually transmitted disease (STD) testing.  Diabetes screening. This is done by checking your blood sugar (glucose) after you have not eaten for a while (fasting). You may have this done every 1-3 years.  Discuss your test results, treatment options, and if necessary, the need for more tests with your health care provider. Vaccines Your health care provider may recommend certain vaccines, such as:  Influenza vaccine. This is recommended every year.  Tetanus, diphtheria, and acellular pertussis (Tdap, Td) vaccine. You may need a Td booster every 10 years.  Varicella vaccine. You may need this if you have not been vaccinated.  Zoster vaccine. You may need this after age 70.  Measles, mumps, and rubella (MMR) vaccine. You may need at least one dose of MMR if you were born in 1957 or later. You may also need a second dose.  Pneumococcal 13-valent conjugate (PCV13) vaccine. You may need this if you have certain conditions and have not been vaccinated.  Pneumococcal polysaccharide (PPSV23) vaccine. You may need one or two doses if you smoke cigarettes or if  you have certain conditions.  Meningococcal vaccine. You may need this if you have certain conditions.  Hepatitis A vaccine. You may need this if you have certain conditions or if you travel or work in places where you may be exposed to hepatitis A.  Hepatitis B vaccine. You may need this if you have certain conditions or if you travel or work in places where you may be exposed  to hepatitis B.  Haemophilus influenzae type b (Hib) vaccine. You may need this if you have certain risk factors.  Talk to your health care provider about which screenings and vaccines you need and how often you need them. This information is not intended to replace advice given to you by your health care provider. Make sure you discuss any questions you have with your health care provider. Document Released: 02/26/2015 Document Revised: 10/20/2015 Document Reviewed: 12/01/2014 Elsevier Interactive Patient Education  [employer].

## 2017-04-10 ENCOUNTER — Encounter: Payer: Self-pay | Admitting: Medical

## 2017-04-10 ENCOUNTER — Telehealth: Payer: Self-pay | Admitting: Medical

## 2017-04-10 DIAGNOSIS — M542 Cervicalgia: Secondary | ICD-10-CM

## 2017-04-10 NOTE — Telephone Encounter (Signed)
Referral to sports medicine placed

## 2017-04-17 ENCOUNTER — Ambulatory Visit: Payer: 59 | Admitting: Family Medicine

## 2017-04-17 ENCOUNTER — Encounter: Payer: Self-pay | Admitting: Family Medicine

## 2017-04-17 VITALS — BP 116/78 | HR 78 | Ht 73.0 in | Wt 172.0 lb

## 2017-04-17 DIAGNOSIS — M542 Cervicalgia: Secondary | ICD-10-CM

## 2017-04-17 DIAGNOSIS — K117 Disturbances of salivary secretion: Secondary | ICD-10-CM | POA: Diagnosis not present

## 2017-04-17 DIAGNOSIS — M5010 Cervical disc disorder with radiculopathy, unspecified cervical region: Secondary | ICD-10-CM

## 2017-04-17 DIAGNOSIS — H6122 Impacted cerumen, left ear: Secondary | ICD-10-CM | POA: Diagnosis not present

## 2017-04-17 NOTE — Patient Instructions (Addendum)
You have cervical radiculopathy (an irritated nerve in the neck). Aleve 2 tabs twice a day with food for pain and inflammation. Consider cervical collar if severely painful. Simple range of motion exercises within limits of pain to prevent further stiffness. Start physical therapy for stretching, exercises, traction, and modalities. Heat 15 minutes at a time 3-4 times a day to help with spasms. Watch head position when on computers, texting, when sleeping in bed - should in line with back to prevent further nerve traction and irritation. Consider home traction unit if you get benefit with this in physical therapy. If not improving we will consider an MRI. Follow up with me in 5-6 weeks for reevaluation.

## 2017-04-19 ENCOUNTER — Encounter: Payer: Self-pay | Admitting: Family Medicine

## 2017-04-19 DIAGNOSIS — M542 Cervicalgia: Secondary | ICD-10-CM | POA: Insufficient documentation

## 2017-04-19 NOTE — Progress Notes (Signed)
PCP and consultation requested by: Esperanza RichtersSaguier, Edward, PA-C  Subjective:   HPI: Patient is a 57 y.o. male here for neck pain.  Patient reports about 3-5 years ago he accidentally landed onto his upper back and neck.  He is continued to have problems with posterior neck pain more at the base of his neck but over the past 8-10 months has had radiation of pain into his left arm all the way to the hand with left hand feeling more weak.  He has an associated numbness and cold feeling as well that is worse at nighttime.  He has a friend that is shown him some physical therapy so has been doing some home exercises.  He has trouble gripping and picking items up.  No problems with the right side.  Pain level is currently at a 2 out of 10 level. He is right-handed.  Past Medical History:  Diagnosis Date  . Family history of prostate cancer     DRE WNL; PSA never > 0.38  . Fasting hyperglycemia     PMH of  . GERD (gastroesophageal reflux disease)   . Tinnitus    w/o hearing loss    Current Outpatient Medications on File Prior to Visit  Medication Sig Dispense Refill  . LORazepam (ATIVAN) 0.5 MG tablet Take 1 tablet (0.5 mg total) by mouth 2 (two) times daily. Prior rx sig was twice a day as needed but gave lower quantity #30. Increasing number. Did not give adequate volume to cover. Anxiety increasing and has 3 tabs left. Soon to run out. 60 tablet 3  . Multiple Vitamins-Minerals (MENS MULTIVITAMIN PLUS) TABS Take by mouth daily.    . sildenafil (VIAGRA) 100 MG tablet Take 1 tablet (100 mg total) by mouth daily as needed for erectile dysfunction. 10 tablet 0  . [DISCONTINUED] venlafaxine XR (EFFEXOR-XR) 37.5 MG 24 hr capsule Take 1 capsule (37.5 mg total) by mouth daily with breakfast. 30 capsule 0   No current facility-administered medications on file prior to visit.     Past Surgical History:  Procedure Laterality Date  . COLONOSCOPY  05/2013   Neg, Dr [name]  . KNEE SURGERY Right   .  TONSILLECTOMY AND ADENOIDECTOMY    . VASECTOMY      Allergies  Allergen Reactions  . Penicillins     ? Reaction @ age 89    Social History   Socioeconomic History  . Marital status: Married    Spouse name: Not on file  . Number of children: Not on file  . Years of education: Not on file  . Highest education level: Not on file  Social Needs  . Financial resource strain: Not on file  . Food insecurity - worry: Not on file  . Food insecurity - inability: Not on file  . Transportation needs - medical: Not on file  . Transportation needs - non-medical: Not on file  Occupational History  . Not on file  Tobacco Use  . Smoking status: Never Smoker  . Smokeless tobacco: Never Used  Substance and Sexual Activity  . Alcohol use: Yes    Comment: very rare wine , < one per month per pt.  . Drug use: No  . Sexual activity: Yes  Other Topics Concern  . Not on file  Social History Narrative  . Not on file    Family History  Problem Relation Age of Onset  . COPD Mother        smoker  . Prostate cancer  Father 11       no longer with prostate cancer  . Breast cancer Sister   . COPD Paternal Aunt        smoker  . Stroke Maternal Grandfather         late 36s  . Heart disease Neg Hx   . Colon cancer Neg Hx   . Esophageal cancer Neg Hx   . Pancreatic cancer Neg Hx   . Rectal cancer Neg Hx   . Stomach cancer Neg Hx   . Diabetes Neg Hx     BP 116/78   Pulse 78   Ht 6\' 1"  (1.854 m)   Wt 172 lb (78 kg)   BMI 22.69 kg/m   Review of Systems: See HPI above.     Objective:  Physical Exam:  Gen: NAD, comfortable in exam room  Neck: No gross deformity, swelling, bruising. TTP left cervical and upper thoracic paraspinal region.  No midline/bony TTP. FROM. BUE strength 5/5.   Sensation intact to light touch.   2+ equal reflexes in triceps, biceps, brachioradialis tendons. Negative spurlings. NV intact distal BUEs.  Left shoulder: No swelling, ecchymoses.  No gross  deformity. No TTP. FROM. Negative Hawkins, Neers. Negative Yergasons. Strength 5/5 with empty can and resisted internal/external rotation. Negative apprehension. NV intact distally.   Assessment & Plan:  1. Neck pain - description consistent with cervical radiculopathy though exam is reassuring.  Start aleve and physical therapy.  Discussed ergonomic issues.  Heat for spasms.  F/u in 5-6 weeks for reevaluation.

## 2017-04-19 NOTE — Assessment & Plan Note (Signed)
description consistent with cervical radiculopathy though exam is reassuring.  Start aleve and physical therapy.  Discussed ergonomic issues.  Heat for spasms.  F/u in 5-6 weeks for reevaluation.

## 2017-05-10 ENCOUNTER — Encounter: Payer: Self-pay | Admitting: Physical Therapy

## 2017-05-10 ENCOUNTER — Other Ambulatory Visit: Payer: Self-pay

## 2017-05-10 ENCOUNTER — Ambulatory Visit: Payer: 59 | Attending: Family Medicine | Admitting: Physical Therapy

## 2017-05-10 DIAGNOSIS — M542 Cervicalgia: Secondary | ICD-10-CM | POA: Diagnosis not present

## 2017-05-10 DIAGNOSIS — M5412 Radiculopathy, cervical region: Secondary | ICD-10-CM | POA: Insufficient documentation

## 2017-05-10 DIAGNOSIS — R293 Abnormal posture: Secondary | ICD-10-CM | POA: Diagnosis present

## 2017-05-10 NOTE — Therapy (Signed)
[facility] [place] [address]  [address] [place], [place], [address] Phone: [phone]   Fax:  [phone]  Physical Therapy Evaluation  Patient Details  Name: Mark [name] MRN: [id number] Date of Birth: 02/28/1960 Referring Provider: Dr. Norton BlizzardShane Hudnall   Encounter Date: 05/10/2017  PT End of Session - 05/10/17 0846    Visit Number  1    Number of Visits  12    Date for PT Re-Evaluation  06/21/17    PT Start Time  0803    PT Stop Time  0842    PT Time Calculation (min)  39 min    Activity Tolerance  Patient tolerated treatment well    Behavior During Therapy  [facility] for tasks assessed/performed       Past Medical History:  Diagnosis Date  . Family history of prostate cancer     DRE WNL; PSA never > 0.38  . Fasting hyperglycemia     PMH of  . GERD (gastroesophageal reflux disease)   . Tinnitus    w/o hearing loss    Past Surgical History:  Procedure Laterality Date  . COLONOSCOPY  05/2013   Neg, Dr [name]  . KNEE SURGERY Right   . TONSILLECTOMY AND ADENOIDECTOMY    . VASECTOMY      There were no vitals filed for this visit.   Subjective Assessment - 05/10/17 0805    Subjective  Neck has been bothering him for a while. Is a runner - anything over a few miles is painful. Does have radicular pain of L UE - feels strength is different and colder. Uses a computer at work - typing can be difficulty. Denies HA. Some N&T into hand - not arm. Feels some restrictions in motion in neck and arm.     Pertinent History  n/a    Diagnostic tests  xray - pateint reports arthritis and pinched nerve    Patient Stated Goals  improve pain    Currently in Pain?  No/denies         [facility] PT Assessment - 05/10/17 [id number]      Assessment   Medical Diagnosis  Cervical disc disorder with radiculopathy of cervical region    Referring Provider  Dr. Norton BlizzardShane Hudnall    Onset Date/Surgical Date  -- intermittently for a few years    Next MD  Visit  05/22/17    Prior Therapy  no      Precautions   Precautions  None      Restrictions   Weight Bearing Restrictions  No      Balance Screen   Has the patient fallen in the past 6 months  No    Has the patient had a decrease in activity level because of a fear of falling?   No    Is the patient reluctant to leave their home because of a fear of falling?   No      Home Public house managernvironment   Living Environment  Private residence      Prior Function   Level of Independence  Independent    Vocation  Full time employment    Vocation Requirements  computer work    Leisure  running      Cognition   Overall Cognitive Status  Within Functional Limits for tasks assessed      Observation/Other Assessments   Focus on Therapeutic Outcomes (FOTO)   Neck: 61 (39% limited, predicted 31% limited)      Sensation  Light Touch  Appears Intact      Coordination   Gross Motor Movements are Fluid and Coordinated  Yes      Posture/Postural Control   Posture/Postural Control  Postural limitations    Postural Limitations  Rounded Shoulders;Forward head      ROM / Strength   AROM / PROM / Strength  AROM;Strength      AROM   AROM Assessment Site  Cervical    Cervical Flexion  WNL    Cervical Extension  WNL    Cervical - Right Side Bend  50% limited - tightness    Cervical - Left Side Bend  75% limited - tightness    Cervical - Right Rotation  WFL - tightness    Cervical - Left Rotation  WFL - tightness      Strength   Overall Strength  Within functional limits for tasks performed    Overall Strength Comments  B UE      Palpation   Palpation comment  TTP at B UT, B LS, B cervical paraspinals, L infra              No data recorded  Objective measurements completed on examination: See above findings.      [facility] Adult PT Treatment/Exercise - 05/10/17 0807      Exercises   Exercises  Neck      Neck Exercises: Seated   Neck Retraction  5 reps;3 secs    Other Seated Exercise   scap retraction 5 x 5 sec      Neck Exercises: Supine   Neck Retraction  10 reps;3 secs      Neck Exercises: Stretches   Upper Trapezius Stretch  Right;Left;1 rep;30 seconds    Levator Stretch  Right;Left;1 rep;30 seconds    Corner Stretch  3 reps;30 seconds    Corner Stretch Limitations  mid and high             PT Education - 05/10/17 0839    Education provided  Yes    Education Details  exam findings, POC, HEP    Person(s) Educated  Patient    Methods  Explanation;Demonstration;Handout    Comprehension  Verbalized understanding;Returned demonstration          PT Long Term Goals - 05/10/17 0906      PT LONG TERM GOAL #1   Title  patient to be independent with advanced HEP    Status  New    Target Date  06/21/17      PT LONG TERM GOAL #2   Title  patient to demonstrate cervical AROM to WNL in all planes without pain or tightness    Status  New    Target Date  06/21/17      PT LONG TERM GOAL #3   Title  patient to demonstrate good postural awareness and body mechanics as it relates to his daily activities.    Status  New    Target Date  06/21/17      PT LONG TERM GOAL #4   Title  patient to report reduction in pain and radicular symptoms >/= 50%    Status  New    Target Date  06/21/17             Plan - 05/10/17 0858    Clinical Impression Statement  Mark [name] is a 57 y/o male presenting to OPPT today regarding neck pain with radicular symptoms into L UE. Patient noticing pain most when running  long distances as well as with prolonged sitting at desk for work duties. Patient today with rounded shoulder and forward head posture, tightness of pec and upper shoulder mm., as well as reduced strength of mid-back/periscapular mm. - all likely contributing to pain and radicular symptoms. Patient today given gentle stretching and strengthening for neck and upper back with good tolerance and carryover. Patient to benefit from skilled PT intervention to address  pain and current functional limitations.    Clinical Presentation  Stable    Clinical Decision Making  Low    Rehab Potential  Good    PT Frequency  2x / week    PT Duration  6 weeks    PT Treatment/Interventions  ADLs/Self Care Home Management;Cryotherapy;Electrical Stimulation;Moist Heat;Traction;Therapeutic exercise;Therapeutic activities;Neuromuscular re-education;Patient/family education;Manual techniques;Vasopneumatic Device;Taping;Dry needling;Passive range of motion    Consulted and Agree with Plan of Care  Patient       Patient will benefit from skilled therapeutic intervention in order to improve the following deficits and impairments:  Pain, Impaired UE functional use, Decreased strength, Decreased activity tolerance, Postural dysfunction, Improper body mechanics  Visit Diagnosis: Cervicalgia  Radiculopathy, cervical region  Abnormal posture     Problem List Patient Active Problem List   Diagnosis Date Noted  . Neck pain 04/19/2017  . Anxiety state 04/28/2015  . Wellness examination 03/24/2015  . Acute pharyngitis 03/30/2014  . Intercostal muscle tear 06/20/2013  . Nonspecific abnormal electrocardiogram (ECG) (EKG) 03/08/2011  . GERD 11/14/2007  . ANXIETY STATE NOS 12/11/2006  . Hyperglycemia 09/06/2006     Kipp Laurence, PT, DPT 05/10/17 9:08 AM   [facility] [address]  [address] [place], [place], [address] Phone: [phone]   Fax:  [phone]  Name: Mark [name] MRN: [id number] Date of Birth: [birth year]

## 2017-05-10 NOTE — Patient Instructions (Signed)
Axial Extension (Chin Tuck)   Pull chin in and lengthen back of neck. Hold __5__ seconds while counting out loud. Repeat _10-15___ times. Do __2-3__ sessions per day.  Flexibility: Upper Trapezius Stretch   Gently grasp right side of head while reaching behind back with other hand. Tilt head away until a gentle stretch is felt. Hold __30__ seconds. Repeat __3__ times per set.  Do __2-3__ sessions per day.  Levator Scapula Stretch, Sitting   Sit, one hand tucked under hip on side to be stretched, other hand over top of head. Turn head toward other side and look down. Use hand on head to gently stretch neck in that position. Hold __30_ seconds. Repeat __3_ times per session. Do _2__ sessions per day. **STRETCH NOSE TO POCKET**  Scapular Retraction (Standing)   With arms at sides, pinch shoulder blades together. Repeat __15__ times per set. Do __2-3__ sessions per day.  Resistive Band Rowing   With resistive band anchored in door, grasp both ends. Keeping elbows bent, pull back, squeezing shoulder blades together. Hold __3-5__ seconds. Repeat __15__ times. Do _2__ sessions per day.  Pectoral Stretch   With arms behind doorjamb, gently lean forward. Stretch is felt across chest. Hold __30__ seconds. Repeat __3__ times. Do __2__ sessions per day. **arms at goal post and "Y" position**

## 2017-05-15 ENCOUNTER — Ambulatory Visit: Payer: 59 | Attending: Family Medicine

## 2017-05-15 DIAGNOSIS — M5412 Radiculopathy, cervical region: Secondary | ICD-10-CM | POA: Insufficient documentation

## 2017-05-15 DIAGNOSIS — M542 Cervicalgia: Secondary | ICD-10-CM | POA: Diagnosis not present

## 2017-05-15 DIAGNOSIS — R293 Abnormal posture: Secondary | ICD-10-CM | POA: Insufficient documentation

## 2017-05-15 NOTE — Therapy (Signed)
[facility] [place] [address]  [address] [place], [place], [address] Phone: [phone]   Fax:  [phone]  Physical Therapy Treatment  Patient Details  Name: Mark [name] MRN: [id number] Date of Birth: 1960-04-23 Referring Provider: Dr. Norton BlizzardShane Hudnall   Encounter Date: 05/15/2017  PT End of Session - 05/15/17 0803    Visit Number  2    Number of Visits  12    Date for PT Re-Evaluation  06/21/17    PT Start Time  0800    PT Stop Time  0852    PT Time Calculation (min)  52 min    Activity Tolerance  Patient tolerated treatment well    Behavior During Therapy  [facility] for tasks assessed/performed       Past Medical History:  Diagnosis Date  . Family history of prostate cancer     DRE WNL; PSA never > 0.38  . Fasting hyperglycemia     PMH of  . GERD (gastroesophageal reflux disease)   . Tinnitus    w/o hearing loss    Past Surgical History:  Procedure Laterality Date  . COLONOSCOPY  05/2013   Neg, Dr [name]  . KNEE SURGERY Right   . TONSILLECTOMY AND ADENOIDECTOMY    . VASECTOMY      There were no vitals filed for this visit.  Subjective Assessment - 05/15/17 0807    Subjective  Pt. noting he has felt some improvement in neck pain since performing HEP.      Pertinent History  n/a    Diagnostic tests  xray - pateint reports arthritis and pinched nerve    Patient Stated Goals  improve pain    Currently in Pain?  No/denies    Multiple Pain Sites  No                       [facility] Adult PT Treatment/Exercise - 05/15/17 0820      Neck Exercises: Machines for Strengthening   UBE (Upper Arm Bike)  UBE: lvl 1.0, 3 min forwards/backwards     Cybex Row  10# x 15 reps; focusing on sccap. retraction + chin tuck      Neck Exercises: Theraband   Shoulder Extension  10 reps;Red    Shoulder Extension Limitations  Tactile cues required for full scapular retraction     Rows  15 reps;Red    Rows Limitations   Tactile cues required for full scap. squeeze       Neck Exercises: Seated   Other Seated Exercise  Scap retraction 5 x 5 sec + Chin tuck       Modalities   Modalities  Traction      Traction   Type of Traction  Cervical    Min (lbs)  10    Max (lbs)  16    Hold Time  60    Rest Time  20    Time  -- 10'      Manual Therapy   Manual Therapy  Manual Traction;Passive ROM    Manual therapy comments  Hooklying     Passive ROM  Cervical PROM side bending, rotation to tolerance; gentle manual UT, LS stretch x 30 sec each way     Manual Traction  Manual cervical 2 x 1 min       Neck Exercises: Stretches   Corner Stretch  3 reps;30 seconds    Corner Stretch Limitations  low, mid and high  PT Long Term Goals - 05/15/17 0805      PT LONG TERM GOAL #1   Title  patient to be independent with advanced HEP    Status  On-going      PT LONG TERM GOAL #2   Title  patient to demonstrate cervical AROM to WNL in all planes without pain or tightness    Status  On-going      PT LONG TERM GOAL #3   Title  patient to demonstrate good postural awareness and body mechanics as it relates to his daily activities.    Status  On-going      PT LONG TERM GOAL #4   Title  patient to report reduction in pain and radicular symptoms >/= 50%    Status  On-going            Plan - 05/15/17 0805    Clinical Impression Statement  Mark [name] reporting some improvement in neck pain since performing HEP.  Tolerated gentle stretching and ROM of cervical musculature well today however with difficulty relaxing musculature.  Good tolerance for cervical manual traction today thus initiated mechanical cervical traction with good overall response.  Pt. encouraged to monitor radicular symptoms of L UE and report response to traction next visit.  Ended treatment pain free.  Will continue to progress toward goals.        PT Treatment/Interventions  ADLs/Self Care Home  Management;Cryotherapy;Electrical Stimulation;Moist Heat;Traction;Therapeutic exercise;Therapeutic activities;Neuromuscular re-education;Patient/family education;Manual techniques;Vasopneumatic Device;Taping;Dry needling;Passive range of motion    Consulted and Agree with Plan of Care  Patient       Patient will benefit from skilled therapeutic intervention in order to improve the following deficits and impairments:  Pain, Impaired UE functional use, Decreased strength, Decreased activity tolerance, Postural dysfunction, Improper body mechanics  Visit Diagnosis: Cervicalgia  Radiculopathy, cervical region  Abnormal posture     Problem List Patient Active Problem List   Diagnosis Date Noted  . Neck pain 04/19/2017  . Anxiety state 04/28/2015  . Wellness examination 03/24/2015  . Acute pharyngitis 03/30/2014  . Intercostal muscle tear 06/20/2013  . Nonspecific abnormal electrocardiogram (ECG) (EKG) 03/08/2011  . GERD 11/14/2007  . ANXIETY STATE NOS 12/11/2006  . Hyperglycemia 09/06/2006    Kermit Balo, PTA 05/15/17 8:56 AM   [facility] [address]  [address] [place], [place], [address] Phone: [phone]   Fax:  [phone]  Name: Mark [name] MRN: [id number] Date of Birth: 1960/09/25

## 2017-05-17 ENCOUNTER — Ambulatory Visit: Payer: 59 | Admitting: Physical Therapy

## 2017-05-17 ENCOUNTER — Encounter: Payer: Self-pay | Admitting: Physical Therapy

## 2017-05-17 DIAGNOSIS — R293 Abnormal posture: Secondary | ICD-10-CM

## 2017-05-17 DIAGNOSIS — M542 Cervicalgia: Secondary | ICD-10-CM

## 2017-05-17 DIAGNOSIS — M5412 Radiculopathy, cervical region: Secondary | ICD-10-CM

## 2017-05-17 NOTE — Patient Instructions (Signed)

## 2017-05-17 NOTE — Therapy (Signed)
[facility] [address]  [address] [place], [place], [address] Phone: [phone]   Fax:  [phone]  Physical Therapy Treatment  Patient Details  Name: Mark [name] MRN: [id number] Date of Birth: Feb 18, 1960 Referring Provider: Dr. Norton Blizzard   Encounter Date: 05/17/2017  PT End of Session - 05/17/17 1619    Visit Number  3    Number of Visits  12    Date for PT Re-Evaluation  06/21/17    PT Start Time  1617    PT Stop Time  1656    PT Time Calculation (min)  39 min    Activity Tolerance  Patient tolerated treatment well    Behavior During Therapy  [facility] for tasks assessed/performed       Past Medical History:  Diagnosis Date  . Family history of prostate cancer     DRE WNL; PSA never > 0.38  . Fasting hyperglycemia     PMH of  . GERD (gastroesophageal reflux disease)   . Tinnitus    w/o hearing loss    Past Surgical History:  Procedure Laterality Date  . COLONOSCOPY  05/2013   Neg, Dr Marina Goodell  . KNEE SURGERY Right   . TONSILLECTOMY AND ADENOIDECTOMY    . VASECTOMY      There were no vitals filed for this visit.  Subjective Assessment - 05/17/17 1619    Subjective  starting to find some relief - good complaince with HEP    Diagnostic tests  xray - patient reports arthritis and pinched nerve    Patient Stated Goals  improve pain    Currently in Pain?  No/denies                       [facility] Adult PT Treatment/Exercise - 05/17/17 1620      Neck Exercises: Machines for Strengthening   UBE (Upper Arm Bike)  L2.5 x 6 min (3/3)      Neck Exercises: Theraband   Shoulder External Rotation  15 reps;Red standing against 1/2 foam roller    Horizontal ABduction  15 reps;Red standing against 1/2 foam roll      Neck Exercises: Standing   Other Standing Exercises  TRX row - 10 reps    Other Standing Exercises  cable column - 10# - upright row x 15      Manual Therapy   Manual Therapy  Joint  mobilization;Soft tissue mobilization;Myofascial release    Manual therapy comments  prone    Joint Mobilization  grade II-III CPAs of loer cervical and upper thoracic segments for improved mobility    Soft tissue mobilization  STM to B UT, B LS, B cervical paraspinals    Myofascial Release  Manual trigger point release to B UT       Neck Exercises: Stretches   Corner Stretch  3 reps;30 seconds    Corner Stretch Limitations  mid and high       Trigger Point Dry Needling - 05/17/17 1710    Consent Given?  Yes    Education Handout Provided  Yes    Muscles Treated Upper Body  Upper trapezius bilateral    Upper Trapezius Response  Twitch reponse elicited;Palpable increased muscle length                PT Long Term Goals - 05/15/17 0805      PT LONG TERM GOAL #1   Title  patient to be independent  with advanced HEP    Status  On-going      PT LONG TERM GOAL #2   Title  patient to demonstrate cervical AROM to WNL in all planes without pain or tightness    Status  On-going      PT LONG TERM GOAL #3   Title  patient to demonstrate good postural awareness and body mechanics as it relates to his daily activities.    Status  On-going      PT LONG TERM GOAL #4   Title  patient to report reduction in pain and radicular symptoms >/= 50%    Status  On-going            Plan - 05/17/17 1620    Clinical Impression Statement  Patient continues to report slight improvements in neck pain since starting PT - also with good compliance of HEP. Patient and PT today discussing trial of DN with all risks and benefits reviewed with patient wanting to proceed. Excellent twitch response at B UT with notable improved tissue quality following DN. Will continue to progress towards goals.     PT Treatment/Interventions  ADLs/Self Care Home Management;Cryotherapy;Electrical Stimulation;Moist Heat;Traction;Therapeutic exercise;Therapeutic activities;Neuromuscular re-education;Patient/family  education;Manual techniques;Vasopneumatic Device;Taping;Dry needling;Passive range of motion    Consulted and Agree with Plan of Care  Patient       Patient will benefit from skilled therapeutic intervention in order to improve the following deficits and impairments:  Pain, Impaired UE functional use, Decreased strength, Decreased activity tolerance, Postural dysfunction, Improper body mechanics  Visit Diagnosis: Cervicalgia  Radiculopathy, cervical region  Abnormal posture     Problem List Patient Active Problem List   Diagnosis Date Noted  . Neck pain 04/19/2017  . Anxiety state 04/28/2015  . Wellness examination 03/24/2015  . Acute pharyngitis 03/30/2014  . Intercostal muscle tear 06/20/2013  . Nonspecific abnormal electrocardiogram (ECG) (EKG) 03/08/2011  . GERD 11/14/2007  . ANXIETY STATE NOS 12/11/2006  . Hyperglycemia 09/06/2006     Kipp LaurenceStephanie R Corran Lalone, PT, DPT 05/17/17 5:10 PM   [facility] [address]  [address] [place], [place], [address] Phone: [phone]   Fax:  [phone]  Name: Megan MansJeffrey Samudio MRN: [id number] Date of Birth: September 21, 1960

## 2017-05-22 ENCOUNTER — Encounter: Payer: Self-pay | Admitting: Family Medicine

## 2017-05-22 ENCOUNTER — Encounter: Payer: Self-pay | Admitting: Physical Therapy

## 2017-05-22 ENCOUNTER — Ambulatory Visit: Payer: 59 | Admitting: Physical Therapy

## 2017-05-22 ENCOUNTER — Ambulatory Visit: Payer: 59 | Admitting: Family Medicine

## 2017-05-22 DIAGNOSIS — M542 Cervicalgia: Secondary | ICD-10-CM

## 2017-05-22 DIAGNOSIS — R293 Abnormal posture: Secondary | ICD-10-CM

## 2017-05-22 DIAGNOSIS — M5412 Radiculopathy, cervical region: Secondary | ICD-10-CM

## 2017-05-22 NOTE — Therapy (Signed)
[facility] [address]  [address] [place], [place], [address] Phone: [phone]   Fax:  [phone]  Physical Therapy Treatment  Patient Details  Name: Mark [name] MRN: [id number] Date of Birth: [birth year] Referring Provider: Dr. Norton BlizzardShane Hudnall   Encounter Date: 05/22/2017  PT End of Session - 05/22/17 0921    Visit Number  4    Number of Visits  12    Date for PT Re-Evaluation  06/21/17    PT Start Time  0920    PT Stop Time  1001    PT Time Calculation (min)  41 min    Activity Tolerance  Patient tolerated treatment well    Behavior During Therapy  [facility] for tasks assessed/performed       Past Medical History:  Diagnosis Date  . Family history of prostate cancer     DRE WNL; PSA never > 0.38  . Fasting hyperglycemia     PMH of  . GERD (gastroesophageal reflux disease)   . Tinnitus    w/o hearing loss    Past Surgical History:  Procedure Laterality Date  . COLONOSCOPY  05/2013   Neg, Dr [name]  . KNEE SURGERY Right   . TONSILLECTOMY AND ADENOIDECTOMY    . VASECTOMY      There were no vitals filed for this visit.  Subjective Assessment - 05/22/17 0921    Subjective  seen by MD this morning - pleased with current progress - continue PT for a few more visits    Diagnostic tests  xray - patient reports arthritis and pinched nerve    Patient Stated Goals  improve pain    Currently in Pain?  No/denies    Pain Score  0-No pain                       [facility] Adult PT Treatment/Exercise - 05/22/17 0922      Neck Exercises: Machines for Strengthening   UBE (Upper Arm Bike)  L3 x 6 min (3/3)      Neck Exercises: Theraband   Shoulder External Rotation  15 reps;Red hooklying on pool noodle    Horizontal ABduction  15 reps;Red hooklying on pool noodle    Other Theraband Exercises  X to V - yellow tband x 15 reps      Neck Exercises: Standing   Wall Push Ups  10 reps 2 sets; 2nd set with orange p  ball    Other Standing Exercises  TRX row - 15 reps    Other Standing Exercises  resisted wall clocks - red tband x 10 each side      Neck Exercises: Prone   Other Prone Exercise  I's, T's, Y's over green pball x 10 each - 1#      Neck Exercises: Stretches   Corner Stretch  3 reps;30 seconds    Corner Stretch Limitations  mid and high    Other Neck Stretches  open book 5 x 10 sec each side                  PT Long Term Goals - 05/15/17 0805      PT LONG TERM GOAL #1   Title  patient to be independent with advanced HEP    Status  On-going      PT LONG TERM GOAL #2   Title  patient to demonstrate cervical AROM to WNL in all planes without pain or  tightness    Status  On-going      PT LONG TERM GOAL #3   Title  patient to demonstrate good postural awareness and body mechanics as it relates to his daily activities.    Status  On-going      PT LONG TERM GOAL #4   Title  patient to report reduction in pain and radicular symptoms >/= 50%    Status  On-going            Plan - 05/22/17 1610    Clinical Impression Statement  Patient seen by MD this morning - pleased with current progress with ability to demo improvements in cervical AROM. Patient with good relief from DN last session - was sore but noted good improvements in symptoms next day. Tolerable to all strength progressions of periscapular mm this visit with no increased pain. Will continue to progress towards goals.     PT Treatment/Interventions  ADLs/Self Care Home Management;Cryotherapy;Electrical Stimulation;Moist Heat;Traction;Therapeutic exercise;Therapeutic activities;Neuromuscular re-education;Patient/family education;Manual techniques;Vasopneumatic Device;Taping;Dry needling;Passive range of motion    Consulted and Agree with Plan of Care  Patient       Patient will benefit from skilled therapeutic intervention in order to improve the following deficits and impairments:  Pain, Impaired UE functional  use, Decreased strength, Decreased activity tolerance, Postural dysfunction, Improper body mechanics  Visit Diagnosis: Cervicalgia  Radiculopathy, cervical region  Abnormal posture     Problem List Patient Active Problem List   Diagnosis Date Noted  . Neck pain 04/19/2017  . Anxiety state 04/28/2015  . Wellness examination 03/24/2015  . Acute pharyngitis 03/30/2014  . Intercostal muscle tear 06/20/2013  . Nonspecific abnormal electrocardiogram (ECG) (EKG) 03/08/2011  . GERD 11/14/2007  . ANXIETY STATE NOS 12/11/2006  . Hyperglycemia 09/06/2006    Kipp Laurence, PT, DPT 05/22/17 10:01 AM   [facility] [address]  [address] [place], [place], [address] Phone: [phone]   Fax:  [phone]  Name: Mark [name] MRN: [id number] Date of Birth: 1960-07-05

## 2017-05-22 NOTE — Patient Instructions (Signed)
Continue with physical therapy and home exercises - I would definitely do 3 more visits but after that if you're doing well, it's up to you and the therapist if you want to go on a 30 day hold. Aleve only if needed. If you're still struggling with strength in your left hand at ~6 weeks follow up with me then.  Otherwise follow up as needed.

## 2017-05-24 ENCOUNTER — Encounter: Payer: Self-pay | Admitting: Family Medicine

## 2017-05-24 NOTE — Progress Notes (Signed)
PCP and consultation requested by: Esperanza Richters, PA-C  Subjective:   HPI: Patient is a 57 y.o. male here for neck pain.  3/5: Patient reports about 3-5 years ago he accidentally landed onto his upper back and neck.  He is continued to have problems with posterior neck pain more at the base of his neck but over the past 8-10 months has had radiation of pain into his left arm all the way to the hand with left hand feeling more weak.  He has an associated numbness and cold feeling as well that is worse at nighttime.  He has a friend that is shown him some physical therapy so has been doing some home exercises.  He has trouble gripping and picking items up.  No problems with the right side.  Pain level is currently at a 2 out of 10 level. He is right-handed.  4/9: Patient reports he's doing well. Pain level 0/10 now. Rarely taking aleve. Having some difficulty picking things up with left hand but no other complaints. Doing well with physical therapy, home exercises. Pain level 0/10. No skin changes, numbness.  Past Medical History:  Diagnosis Date  . Family history of prostate cancer     DRE WNL; PSA never > 0.38  . Fasting hyperglycemia     PMH of  . GERD (gastroesophageal reflux disease)   . Tinnitus    w/o hearing loss    Current Outpatient Medications on File Prior to Visit  Medication Sig Dispense Refill  . LORazepam (ATIVAN) 0.5 MG tablet Take 1 tablet (0.5 mg total) by mouth 2 (two) times daily. Prior rx sig was twice a day as needed but gave lower quantity #30. Increasing number. Did not give adequate volume to cover. Anxiety increasing and has 3 tabs left. Soon to run out. 60 tablet 3  . Multiple Vitamins-Minerals (MENS MULTIVITAMIN PLUS) TABS Take by mouth daily.    . sildenafil (VIAGRA) 100 MG tablet Take 1 tablet (100 mg total) by mouth daily as needed for erectile dysfunction. 10 tablet 0  . [DISCONTINUED] venlafaxine XR (EFFEXOR-XR) 37.5 MG 24 hr capsule Take 1  capsule (37.5 mg total) by mouth daily with breakfast. 30 capsule 0   No current facility-administered medications on file prior to visit.     Past Surgical History:  Procedure Laterality Date  . COLONOSCOPY  05/2013   Neg, Dr Marina Goodell  . KNEE SURGERY Right   . TONSILLECTOMY AND ADENOIDECTOMY    . VASECTOMY      Allergies  Allergen Reactions  . Penicillins     ? Reaction @ age 9    Social History   Socioeconomic History  . Marital status: Married    Spouse name: Not on file  . Number of children: Not on file  . Years of education: Not on file  . Highest education level: Not on file  Occupational History  . Not on file  Social Needs  . Financial resource strain: Not on file  . Food insecurity:    Worry: Not on file    Inability: Not on file  . Transportation needs:    Medical: Not on file    Non-medical: Not on file  Tobacco Use  . Smoking status: Never Smoker  . Smokeless tobacco: Never Used  Substance and Sexual Activity  . Alcohol use: Yes    Comment: very rare wine , < one per month per pt.  . Drug use: No  . Sexual activity: Yes  Lifestyle  .  Physical activity:    Days per week: Not on file    Minutes per session: Not on file  . Stress: Not on file  Relationships  . Social connections:    Talks on phone: Not on file    Gets together: Not on file    Attends religious service: Not on file    Active member of club or organization: Not on file    Attends meetings of clubs or organizations: Not on file    Relationship status: Not on file  . Intimate partner violence:    Fear of current or ex partner: Not on file    Emotionally abused: Not on file    Physically abused: Not on file    Forced sexual activity: Not on file  Other Topics Concern  . Not on file  Social History Narrative  . Not on file    Family History  Problem Relation Age of Onset  . COPD Mother        smoker  . Prostate cancer Father [id number]       no longer with prostate cancer  . Breast  cancer Sister   . COPD Paternal Aunt        smoker  . Stroke Maternal Grandfather         late 5260s  . Heart disease Neg Hx   . Colon cancer Neg Hx   . Esophageal cancer Neg Hx   . Pancreatic cancer Neg Hx   . Rectal cancer Neg Hx   . Stomach cancer Neg Hx   . Diabetes Neg Hx     BP 105/73   Pulse 66   Ht 6\' 1"  (1.854 m)   Wt 172 lb (78 kg)   BMI 22.69 kg/m   Review of Systems: See HPI above.     Objective:  Physical Exam:  Gen: NAD, comfortable in exam room  Neck: No gross deformity, swelling, bruising. No TTP .  No midline/bony TTP. FROM. BUE strength 5/5.   Sensation intact to light touch.   2+ equal reflexes in triceps, biceps, brachioradialis tendons. NV intact distal BUEs.   Assessment & Plan:  1. Neck pain - description consistent with cervical radiculopathy.  Much improved with physical therapy, home exercises.  Some subjective weakness of left hand will need to monitor.  Aleve only if needed.  Transition over next few visits to HEP.  F/u in 6 weeks or prn.

## 2017-05-24 NOTE — Assessment & Plan Note (Signed)
description consistent with cervical radiculopathy.  Much improved with physical therapy, home exercises.  Some subjective weakness of left hand will need to monitor.  Aleve only if needed.  Transition over next few visits to HEP.  F/u in 6 weeks or prn.

## 2017-05-25 ENCOUNTER — Encounter: Payer: Self-pay | Admitting: Physical Therapy

## 2017-05-25 ENCOUNTER — Ambulatory Visit: Payer: 59 | Admitting: Physical Therapy

## 2017-05-25 DIAGNOSIS — M5412 Radiculopathy, cervical region: Secondary | ICD-10-CM

## 2017-05-25 DIAGNOSIS — R293 Abnormal posture: Secondary | ICD-10-CM

## 2017-05-25 DIAGNOSIS — M542 Cervicalgia: Secondary | ICD-10-CM | POA: Diagnosis not present

## 2017-05-25 NOTE — Therapy (Addendum)
Mark [name] [address]  [place], [place], [address] Phone: [phone]   Fax:  [phone]  Physical Therapy Treatment  Patient Details  Name: Demitrus Francisco MRN: [id number] Date of Birth: [birth year] Referring Provider: Dr. Karlton Lemon   Encounter Date: 05/25/2017  PT End of Session - 05/25/17 0803    Visit Number  5    Number of Visits  12    Date for PT Re-Evaluation  06/21/17    PT Start Time  0801    PT Stop Time  0841    PT Time Calculation (min)  40 min    Activity Tolerance  Patient tolerated treatment well    Behavior During Therapy  [facility] for tasks assessed/performed       Past Medical History:  Diagnosis Date  . Family history of prostate cancer     DRE WNL; PSA never > 0.38  . Fasting hyperglycemia     PMH of  . GERD (gastroesophageal reflux disease)   . Tinnitus    w/o hearing loss    Past Surgical History:  Procedure Laterality Date  . COLONOSCOPY  05/2013   Neg, Dr Henrene Pastor  . KNEE SURGERY Right   . TONSILLECTOMY AND ADENOIDECTOMY    . VASECTOMY      There were no vitals filed for this visit.  Subjective Assessment - 05/25/17 0802    Subjective  doing well - no new complaints, no pain    Diagnostic tests  xray - patient reports arthritis and pinched nerve    Patient Stated Goals  improve pain    Currently in Pain?  No/denies    Pain Score  0-No pain                       [facility] Adult PT Treatment/Exercise - 05/25/17 0803      Neck Exercises: Machines for Strengthening   UBE (Upper Arm Bike)  L3 x 6 min (3/3)    Cybex Row  20# x 15 - narrow grip      Neck Exercises: Standing   Wall Push Ups  15 reps orange pball    Other Standing Exercises  standing against foam roller - flexion x 15, abduction x 15 - 3# in each hand    Other Standing Exercises  serratus rolls - red tabnd x 15      Manual Therapy   Manual Therapy  Soft tissue mobilization;Myofascial  release;Passive ROM    Manual therapy comments  paitnet hooklying    Soft tissue mobilization  STM to B UT, B LS, B infra, B cervical paraspinals, B suboccipital    Myofascial Release  manual trigger [name] release to R UT, R LS    Passive ROM  PROM into cervical lateral flexion for UT stretch 2 x 60 sec each side                  PT Long Term Goals - 05/15/17 0805      PT LONG TERM GOAL #1   Title  patient to be independent with advanced HEP    Status  On-going      PT LONG TERM GOAL #2   Title  patient to demonstrate cervical AROM to WNL in all planes without pain or tightness    Status  On-going      PT LONG TERM GOAL #3   Title  patient to demonstrate good postural awareness and body  mechanics as it relates to his daily activities.    Status  On-going      PT LONG TERM GOAL #4   Title  patient to report reduction in pain and radicular symptoms >/= 50%    Status  On-going            Plan - 05/25/17 0803    Clinical Impression Statement  Doing well - continued STM work at neck and upper back musculature with noted improvement in genreal tightness and pain. Patient with subjective reports of improved N&T of L UE. Tolerable to all strengthening and stretching otherwise, with no increase in N&T or discomfort. Making good progress towards goals.     PT Treatment/Interventions  ADLs/Self Care Home Management;Cryotherapy;Electrical Stimulation;Moist Heat;Traction;Therapeutic exercise;Therapeutic activities;Neuromuscular re-education;Patient/family education;Manual techniques;Vasopneumatic Device;Taping;Dry needling;Passive range of motion    Consulted and Agree with Plan of Care  Patient       Patient will benefit from skilled therapeutic intervention in order to improve the following deficits and impairments:  Pain, Impaired UE functional use, Decreased strength, Decreased activity tolerance, Postural dysfunction, Improper body mechanics  Visit  Diagnosis: Cervicalgia  Radiculopathy, cervical region  Abnormal posture     Problem List Patient Active Problem List   Diagnosis Date Noted  . Neck pain 04/19/2017  . Anxiety state 04/28/2015  . Wellness examination 03/24/2015  . Acute pharyngitis 03/30/2014  . Intercostal muscle tear 06/20/2013  . Nonspecific abnormal electrocardiogram (ECG) (EKG) 03/08/2011  . GERD 11/14/2007  . ANXIETY STATE NOS 12/11/2006  . Hyperglycemia 09/06/2006     Lanney Gins, PT, DPT 05/25/17 11:01 AM  PHYSICAL THERAPY DISCHARGE SUMMARY  Visits from Start of Care: 5  Current functional level related to goals / functional outcomes: See above   Remaining deficits: See above - some continued N&T into UE   Education / Equipment: HEP  Plan: Patient agrees to discharge.  Patient goals were not met. Patient is being discharged due to not returning since the last visit.  ?????    Lanney Gins, PT, DPT 06/28/17 9:18 AM    [facility] [address]  [place], [place], [address] Phone: [phone]   Fax:  [phone]  Name: Maron Stanzione MRN: [id number] Date of Birth: 1960-04-26

## 2017-05-29 ENCOUNTER — Ambulatory Visit: Payer: 59

## 2017-06-01 ENCOUNTER — Ambulatory Visit: Payer: 59 | Admitting: Physical Therapy

## 2017-06-05 ENCOUNTER — Ambulatory Visit: Payer: 59

## 2017-06-08 ENCOUNTER — Ambulatory Visit: Payer: 59 | Admitting: Physical Therapy

## 2017-07-30 ENCOUNTER — Encounter: Payer: Self-pay | Admitting: Medical

## 2017-07-31 ENCOUNTER — Telehealth: Payer: Self-pay | Admitting: Medical

## 2017-07-31 MED ORDER — LORAZEPAM 0.5 MG PO TABS: ORAL_TABLET | ORAL | 0 refills | Status: DC

## 2017-07-31 NOTE — Telephone Encounter (Signed)
Rx ativan sent to pt pharmacy.  

## 2017-07-31 NOTE — Telephone Encounter (Signed)
Opened to review 

## 2017-09-13 ENCOUNTER — Other Ambulatory Visit: Payer: Self-pay | Admitting: Medical

## 2017-09-13 NOTE — Telephone Encounter (Signed)
Sent rx of ativan to pharmacy.

## 2017-09-13 NOTE — Telephone Encounter (Signed)
Refill Request: Lorazepam   Last RX:07/31/17 Last OV:03/26/17 Next QI:ONGEOV:None scheduled  UDS:03/06/17 CSC:09/18/16

## 2017-10-02 ENCOUNTER — Encounter: Payer: Self-pay | Admitting: Physical Therapy

## 2017-10-17 ENCOUNTER — Other Ambulatory Visit: Payer: Self-pay | Admitting: Medical

## 2017-10-19 ENCOUNTER — Encounter: Payer: Self-pay | Admitting: Medical

## 2017-10-19 NOTE — Telephone Encounter (Signed)
Refill Request: Lorazepam  Last RX:09/13/17 Last OV:03/26/17 Next LA:GTXM scheduled  UDS:03/06/17 CSC:8/618 CSR:

## 2017-10-19 NOTE — Telephone Encounter (Signed)
I only gave pt 30 tab of ativan. He is due for new contract and can you pull his narx report. Have him follow up in 2 weeks with me.

## 2017-10-22 NOTE — Telephone Encounter (Signed)
Refilled on 10/19/2017.

## 2017-10-24 NOTE — Telephone Encounter (Signed)
LVM for pt to call the office to schedule an appt with provider within 2 wks.

## 2017-10-30 ENCOUNTER — Ambulatory Visit: Payer: 59 | Admitting: Medical

## 2017-10-30 ENCOUNTER — Encounter: Payer: Self-pay | Admitting: Medical

## 2017-10-30 VITALS — BP 108/66 | HR 68 | Temp 97.7°F | Resp 16 | Ht 73.0 in | Wt 173.2 lb

## 2017-10-30 DIAGNOSIS — F419 Anxiety disorder, unspecified: Secondary | ICD-10-CM

## 2017-10-30 MED ORDER — LORAZEPAM 0.5 MG PO TABS: ORAL_TABLET | ORAL | 4 refills | Status: DC

## 2017-10-30 NOTE — Patient Instructions (Signed)
Your anxiety is under adequate control with current regimen.  I refilled your Ativan today but specified next prescription could be refilled on November 18, 2017 as you have current active prescription presently.  You signed controlled medication contract today.  Your UDS is up-to-date.  As we discussed continue to spread out the 30 tablets over a month.  Follow-up in February for CPE or sooner if needed.

## 2017-10-30 NOTE — Progress Notes (Signed)
Subjective:    Patient ID: Megan MansJeffrey Hofbauer, male    DOB: 1960-06-14, 57 y.o.   MRN: [id number]  HPI  Pt in for follow up.  Pt last pain profile in jan 2019 and narx report sept 2019.  Pt takes ativan for anxiety. Pt last ativan rx was on 10-19-2017. He takes 1-2 tabs a day. He takes about 30 tabs in a month period. Not reporting any depressive symptoms. In past tried to rx ssri for anxiety but he reported side effects.    Review of Systems  Constitutional: Negative for chills, diaphoresis, fatigue and fever.  Respiratory: Negative for chest tightness, shortness of breath and wheezing.   Cardiovascular: Negative for chest pain and palpitations.  Gastrointestinal: Negative for abdominal pain, diarrhea and rectal pain.  Musculoskeletal: Negative for back pain.  Neurological: Negative for dizziness, seizures, speech difficulty, weakness and headaches.  Hematological: Negative for adenopathy. Does not bruise/bleed easily.  Psychiatric/Behavioral: Negative for behavioral problems, confusion, decreased concentration and sleep disturbance. The patient is nervous/anxious.        Pt anxiety is controlled recently with current medication.    Past Medical History:  Diagnosis Date  . Family history of prostate cancer     DRE WNL; PSA never > 0.38  . Fasting hyperglycemia     PMH of  . GERD (gastroesophageal reflux disease)   . Tinnitus    w/o hearing loss     Social History   Socioeconomic History  . Marital status: Married    Spouse name: Not on file  . Number of children: Not on file  . Years of education: Not on file  . Highest education level: Not on file  Occupational History  . Not on file  Social Needs  . Financial resource strain: Not on file  . Food insecurity:    Worry: Not on file    Inability: Not on file  . Transportation needs:    Medical: Not on file    Non-medical: Not on file  Tobacco Use  . Smoking status: Never Smoker  . Smokeless tobacco: Never Used    Substance and Sexual Activity  . Alcohol use: Yes    Comment: very rare wine , < one per month per pt.  . Drug use: No  . Sexual activity: Yes  Lifestyle  . Physical activity:    Days per week: Not on file    Minutes per session: Not on file  . Stress: Not on file  Relationships  . Social connections:    Talks on phone: Not on file    Gets together: Not on file    Attends religious service: Not on file    Active member of club or organization: Not on file    Attends meetings of clubs or organizations: Not on file    Relationship status: Not on file  . Intimate partner violence:    Fear of current or ex partner: Not on file    Emotionally abused: Not on file    Physically abused: Not on file    Forced sexual activity: Not on file  Other Topics Concern  . Not on file  Social History Narrative  . Not on file    Past Surgical History:  Procedure Laterality Date  . COLONOSCOPY  05/2013   Neg, Dr [name]  . KNEE SURGERY Right   . TONSILLECTOMY AND ADENOIDECTOMY    . VASECTOMY      Family History  Problem Relation Age of Onset  .  COPD Mother        smoker  . Prostate cancer Father 74       no longer with prostate cancer  . Breast cancer Sister   . COPD Paternal Aunt        smoker  . Stroke Maternal Grandfather         late 84s  . Heart disease Neg Hx   . Colon cancer Neg Hx   . Esophageal cancer Neg Hx   . Pancreatic cancer Neg Hx   . Rectal cancer Neg Hx   . Stomach cancer Neg Hx   . Diabetes Neg Hx     Allergies  Allergen Reactions  . Penicillins     ? Reaction @ age 56    Current Outpatient Medications on File Prior to Visit  Medication Sig Dispense Refill  . Multiple Vitamins-Minerals (MENS MULTIVITAMIN PLUS) TABS Take by mouth daily.    . sildenafil (VIAGRA) 100 MG tablet Take 1 tablet (100 mg total) by mouth daily as needed for erectile dysfunction. 10 tablet 0  . [DISCONTINUED] venlafaxine XR (EFFEXOR-XR) 37.5 MG 24 hr capsule Take 1 capsule (37.5 mg  total) by mouth daily with breakfast. 30 capsule 0   No current facility-administered medications on file prior to visit.     BP 108/66   Pulse 68   Temp 97.7 F (36.5 C) (Oral)   Resp 16   Ht 6\' 1"  (1.854 m)   Wt 173 lb 3.2 oz (78.6 kg)   SpO2 100%   BMI 22.85 kg/m       Objective:   Physical Exam  General Mental Status- Alert. General Appearance- Not in acute distress.   Skin General: Color- Normal Color. Moisture- Normal Moisture.  Neck Carotid Arteries- Normal color. Moisture- Normal Moisture. No carotid bruits. No JVD.  Chest and Lung Exam Auscultation: Breath Sounds:-Normal.  Cardiovascular Auscultation:Rythm- Regular. Murmurs & Other Heart Sounds:Auscultation of the heart reveals- No Murmurs.   Neurologic Cranial Nerve exam:- CN III-XII intact(No nystagmus), symmetric smile. Strength:- 5/5 equal and symmetric strength both upper and lower extremities.      Assessment & Plan:  Your anxiety is under adequate control with current regimen.  I refilled your Ativan today but specified next prescription could be refilled on November 18, 2017 as you have current active prescription presently.  You signed controlled medication contract today.  Your UDS is up-to-date(narx report ran and reviewed today as well).  As we discussed continue to spread out the 30 tablets over a month.  Follow-up in February for CPE or sooner if needed.  Esperanza Richters, PA-C

## 2017-11-06 ENCOUNTER — Ambulatory Visit: Payer: 59 | Admitting: Medical

## 2017-11-14 ENCOUNTER — Encounter: Payer: Self-pay | Admitting: Medical

## 2017-11-15 ENCOUNTER — Telehealth: Payer: Self-pay | Admitting: Medical

## 2017-11-15 NOTE — Telephone Encounter (Signed)
Would you mind calling pt pharmacy and authorize early refill of his ativan. Then send message back to me to remind me send future rx with 45 tabs.

## 2017-11-16 NOTE — Telephone Encounter (Signed)
Call and notified pharmacy okay to fill refill Ativan

## 2017-11-19 ENCOUNTER — Telehealth: Payer: Self-pay | Admitting: Medical

## 2017-11-19 MED ORDER — LORAZEPAM 0.5 MG PO TABS: 0.5000 mg | ORAL_TABLET | Freq: Two times a day (BID) | ORAL | 1 refills | Status: DC | PRN

## 2017-11-19 NOTE — Telephone Encounter (Signed)
New rx sent in. Cancelled prior 30 tab rx refill of ativan.

## 2017-11-27 ENCOUNTER — Ambulatory Visit: Payer: Self-pay

## 2017-11-27 NOTE — Telephone Encounter (Signed)
Pt. Reports he has issues with his left hand for weeks.Saw Dr.Hudnall and had physical therapy for this "nerve problem." States it helped a little bit. His left hand is weaker, has difficulty grasping objects. Has decreased mobility. States wants to have Mr. Alvira Monday re-evaluate the hand. States the appointment for 12/06/17 is "fine". Instructed to call back if his symptoms worsen. Verbalizes understanding.

## 2017-11-27 NOTE — Telephone Encounter (Signed)
Patient called, left VM to return call to the office to a [place] to discuss symptoms of "Nerve issue in neck that is affecting my left hand" submitted on MyChart.

## 2017-12-06 ENCOUNTER — Ambulatory Visit: Payer: 59 | Admitting: Medical

## 2017-12-06 DIAGNOSIS — Z0289 Encounter for other administrative examinations: Secondary | ICD-10-CM

## 2017-12-11 ENCOUNTER — Encounter: Payer: Self-pay | Admitting: Medical

## 2017-12-11 ENCOUNTER — Ambulatory Visit: Payer: 59 | Admitting: Medical

## 2017-12-11 ENCOUNTER — Other Ambulatory Visit: Payer: Self-pay

## 2017-12-11 VITALS — BP 108/74 | HR 68 | Temp 97.7°F | Resp 14 | Ht 73.0 in | Wt 172.0 lb

## 2017-12-11 DIAGNOSIS — G5602 Carpal tunnel syndrome, left upper limb: Secondary | ICD-10-CM | POA: Diagnosis not present

## 2017-12-11 DIAGNOSIS — M542 Cervicalgia: Secondary | ICD-10-CM | POA: Diagnosis not present

## 2017-12-11 DIAGNOSIS — Z23 Encounter for immunization: Secondary | ICD-10-CM

## 2017-12-11 DIAGNOSIS — S46811A Strain of other muscles, fascia and tendons at shoulder and upper arm level, right arm, initial encounter: Secondary | ICD-10-CM | POA: Diagnosis not present

## 2017-12-11 MED ORDER — PREDNISONE 10 MG PO TABS: ORAL_TABLET | ORAL | 0 refills | Status: DC

## 2017-12-11 NOTE — Progress Notes (Signed)
Subjective:    Patient ID: Mark [name], male    DOB: 1960/12/01, 57 y.o.   MRN: [id number]  HPI  Pt in with left hand/numbness and tingling intermittently. This happens about every three days. He notes some left side trapezius  area pain. He does not associate trap pain with hand symptoms. What he does state is his left hand grip feels constantly weaker. He states these symptoms have occurred for about 6 months or more. Pt is rt handed. Pt does wake up at night feeling hands tingle and numb. Pt does not remember doing any heavy work around house before symptoms started. He states hard to open and close fingers on left hand,.   Pt has tried some ibuprofen occasional/rare.   Pt had neck xray done in February.   Review of Systems  Constitutional: Negative for chills, fatigue and fever.  Respiratory: Negative for chest tightness, shortness of breath and wheezing.   Cardiovascular: Negative for chest pain and palpitations.  Gastrointestinal: Negative for abdominal pain, diarrhea and nausea.  Musculoskeletal: Negative for back pain.       Left hand weakness. Coordination decrease. Numb tingling.  Skin: Negative for rash.  Neurological: Negative for dizziness and headaches.  Hematological: Negative for adenopathy. Does not bruise/bleed easily.  Psychiatric/Behavioral: Negative for behavioral problems and confusion. The patient is not nervous/anxious.     Past Medical History:  Diagnosis Date  . Family history of prostate cancer     DRE WNL; PSA never > 0.38  . Fasting hyperglycemia     PMH of  . GERD (gastroesophageal reflux disease)   . Tinnitus    w/o hearing loss     Social History   Socioeconomic History  . Marital status: Married    Spouse name: Not on file  . Number of children: Not on file  . Years of education: Not on file  . Highest education level: Not on file  Occupational History  . Not on file  Social Needs  . Financial resource strain: Not on file  . Food  insecurity:    Worry: Not on file    Inability: Not on file  . Transportation needs:    Medical: Not on file    Non-medical: Not on file  Tobacco Use  . Smoking status: Never Smoker  . Smokeless tobacco: Never Used  Substance and Sexual Activity  . Alcohol use: Yes    Comment: very rare wine , < one per month per pt.  . Drug use: No  . Sexual activity: Yes  Lifestyle  . Physical activity:    Days per week: Not on file    Minutes per session: Not on file  . Stress: Not on file  Relationships  . Social connections:    Talks on phone: Not on file    Gets together: Not on file    Attends religious service: Not on file    Active member of club or organization: Not on file    Attends meetings of clubs or organizations: Not on file    Relationship status: Not on file  . Intimate partner violence:    Fear of current or ex partner: Not on file    Emotionally abused: Not on file    Physically abused: Not on file    Forced sexual activity: Not on file  Other Topics Concern  . Not on file  Social History Narrative  . Not on file    Past Surgical History:  Procedure Laterality Date  .  COLONOSCOPY  05/2013   Neg, Dr Marina Goodell  . KNEE SURGERY Right   . TONSILLECTOMY AND ADENOIDECTOMY    . VASECTOMY      Family History  Problem Relation Age of Onset  . COPD Mother        smoker  . Prostate cancer Father 8       no longer with prostate cancer  . Breast cancer Sister   . COPD Paternal Aunt        smoker  . Stroke Maternal Grandfather         late 37s  . Heart disease Neg Hx   . Colon cancer Neg Hx   . Esophageal cancer Neg Hx   . Pancreatic cancer Neg Hx   . Rectal cancer Neg Hx   . Stomach cancer Neg Hx   . Diabetes Neg Hx     Allergies  Allergen Reactions  . Penicillins     ? Reaction @ age 21    Current Outpatient Medications on File Prior to Visit  Medication Sig Dispense Refill  . LORazepam (ATIVAN) 0.5 MG tablet Take 1 tablet (0.5 mg total) by mouth 2 (two)  times daily as needed for anxiety. 45 tablet 1  . Multiple Vitamins-Minerals (MENS MULTIVITAMIN PLUS) TABS Take by mouth daily.    . sildenafil (VIAGRA) 100 MG tablet Take 1 tablet (100 mg total) by mouth daily as needed for erectile dysfunction. 10 tablet 0  . [DISCONTINUED] venlafaxine XR (EFFEXOR-XR) 37.5 MG 24 hr capsule Take 1 capsule (37.5 mg total) by mouth daily with breakfast. 30 capsule 0   No current facility-administered medications on file prior to visit.     BP 108/74 (BP Location: Left Arm, Patient Position: Sitting, Cuff Size: Normal)   Pulse 68   Temp 97.7 F (36.5 C) (Oral)   Resp 14   Ht 6\' 1"  (1.854 m)   Wt 172 lb (78 kg)   SpO2 98%   BMI 22.69 kg/m         Objective:   Physical Exam  General Mental Status- Alert. General Appearance- Not in acute distress.   Skin General: Color- Normal Color. Moisture- Normal Moisture.  Neck Carotid Arteries- Normal color. Moisture- Normal Moisture. No carotid bruits. No JVD.  No mid cervical spine tenderness to palpation but does have left side tenderness and trapezius area.  Chest and Lung Exam Auscultation: Breath Sounds:-Normal.  Cardiovascular Auscultation:Rythm- Regular. Murmurs & Other Heart Sounds:Auscultation of the heart reveals- No Murmurs.  Abdomen Inspection:-Inspeection Normal. Palpation/Percussion:Note:No mass. Palpation and Percussion of the abdomen reveal- Non Tender, Non Distended + BS, no rebound or guarding.    Neurologic Cranial Nerve exam:- CN III-XII intact(No nystagmus), symmetric smile.   Left upper extremity- grip strength is 4 out of 5.  Compared to 5 out of 5 right side.  He has difficulty spreading fingers on left upper extremity.  Left wrist-+phalens sign.      Assessment & Plan:    Cervical radiculopathy vs CTS. On review appears sports medicine/Dr. Pearletha Forge  favors cervical cause.  Will recommend prednisone 6 day taper.  Recommend using wrist cock up splint  Will  refer back to sports medicine.  Asked patient to go ahead and go over to Dr. Lazaro Arms office and asked for follow-up but specify follow-up to be in 7 to 10 days.  Follow up in 2 weeks or as needed

## 2017-12-11 NOTE — Patient Instructions (Addendum)
Cervical radiculopathy vs CTS. On review appears sports medicine/Dr. Pearletha Forge  favors cervical cause.  Will recommend prednisone 6 day taper.  Recommend using wrist cock up splint  Will refer back to sports medicine.  Asked patient to go ahead and go over to Dr. Lazaro Arms office and asked for follow-up but specify follow-up to be in 7 to 10 days.  Follow up in 2 weeks or as needed

## 2017-12-21 ENCOUNTER — Ambulatory Visit: Payer: 59 | Admitting: Family Medicine

## 2017-12-27 ENCOUNTER — Encounter: Payer: Self-pay | Admitting: Medical

## 2017-12-27 ENCOUNTER — Ambulatory Visit: Payer: 59 | Admitting: Medical

## 2017-12-27 VITALS — BP 110/78 | HR 76 | Temp 98.2°F | Resp 16 | Ht 73.0 in | Wt 171.8 lb

## 2017-12-27 DIAGNOSIS — R0981 Nasal congestion: Secondary | ICD-10-CM

## 2017-12-27 DIAGNOSIS — M791 Myalgia, unspecified site: Secondary | ICD-10-CM

## 2017-12-27 DIAGNOSIS — J029 Acute pharyngitis, unspecified: Secondary | ICD-10-CM | POA: Diagnosis not present

## 2017-12-27 MED ORDER — BENZONATATE 100 MG PO CAPS: 100.0000 mg | ORAL_CAPSULE | Freq: Three times a day (TID) | ORAL | 0 refills | Status: DC | PRN

## 2017-12-27 MED ORDER — AZITHROMYCIN 250 MG PO TABS: ORAL_TABLET | ORAL | 0 refills | Status: DC

## 2017-12-27 MED ORDER — FLUTICASONE PROPIONATE 50 MCG/ACT NA SUSP: 2.0000 | Freq: Every day | NASAL | 1 refills | Status: DC

## 2017-12-27 NOTE — Patient Instructions (Addendum)
By exam today and recent history, I have more clinically suspicious of potential strep throat.  I am prescribing a azithromycin antibiotic, Flonase nasal spray for congestion and benzonatate for cough.  Had plan to get both rapid strep and flu test today.  We do not have either test available today.  We will treat for strep as stated above.  Low clinical suspicion for flu.  Note he did get a vaccine already.  Follow-up in 7 to 10 days or as needed.

## 2017-12-27 NOTE — Progress Notes (Signed)
Subjective:    Patient ID: Mark [name], male    DOB: 02-16-1960, 57 y.o.   MRN: [id number]  HPI  Pt in with some recent st, nasal congestion, and body aches. Pt had flu vaccine 2 weeks ago approximate. Cough mild and not productive.  Sore throat and nasal congestion present x5 days.  Boddy aches present for 2 days.  Patient describes body aches as mild to moderate.  No known strep contact or flu contact.    Review of Systems  Constitutional: Negative for chills, fatigue and fever.  HENT: Positive for congestion and sore throat. Negative for sinus pressure and sinus pain.   Respiratory: Positive for cough. Negative for chest tightness, shortness of breath and wheezing.   Cardiovascular: Negative for chest pain and palpitations.  Gastrointestinal: Negative for abdominal pain.  Musculoskeletal: Positive for myalgias. Negative for back pain, neck pain and neck stiffness.  Skin: Negative for rash.  Neurological: Negative for dizziness, seizures, weakness and headaches.  Hematological: Negative for adenopathy.  Psychiatric/Behavioral: Negative for behavioral problems.    Past Medical History:  Diagnosis Date  . Family history of prostate cancer     DRE WNL; PSA never > 0.38  . Fasting hyperglycemia     PMH of  . GERD (gastroesophageal reflux disease)   . Tinnitus    w/o hearing loss     Social History   Socioeconomic History  . Marital status: Married    Spouse name: Not on file  . Number of children: Not on file  . Years of education: Not on file  . Highest education level: Not on file  Occupational History  . Not on file  Social Needs  . Financial resource strain: Not on file  . Food insecurity:    Worry: Not on file    Inability: Not on file  . Transportation needs:    Medical: Not on file    Non-medical: Not on file  Tobacco Use  . Smoking status: Never Smoker  . Smokeless tobacco: Never Used  Substance and Sexual Activity  . Alcohol use: Yes   Comment: very rare wine , < one per month per pt.  . Drug use: No  . Sexual activity: Yes  Lifestyle  . Physical activity:    Days per week: Not on file    Minutes per session: Not on file  . Stress: Not on file  Relationships  . Social connections:    Talks on phone: Not on file    Gets together: Not on file    Attends religious service: Not on file    Active member of club or organization: Not on file    Attends meetings of clubs or organizations: Not on file    Relationship status: Not on file  . Intimate partner violence:    Fear of current or ex partner: Not on file    Emotionally abused: Not on file    Physically abused: Not on file    Forced sexual activity: Not on file  Other Topics Concern  . Not on file  Social History Narrative  . Not on file    Past Surgical History:  Procedure Laterality Date  . COLONOSCOPY  05/2013   Neg, Dr Marina Goodell  . KNEE SURGERY Right   . TONSILLECTOMY AND ADENOIDECTOMY    . VASECTOMY      Family History  Problem Relation Age of Onset  . COPD Mother        smoker  . Prostate cancer  Father [id number]       no longer with prostate cancer  . Breast cancer Sister   . COPD Paternal Aunt        smoker  . Stroke Maternal Grandfather         late 7060s  . Heart disease Neg Hx   . Colon cancer Neg Hx   . Esophageal cancer Neg Hx   . Pancreatic cancer Neg Hx   . Rectal cancer Neg Hx   . Stomach cancer Neg Hx   . Diabetes Neg Hx     Allergies  Allergen Reactions  . Penicillins     ? Reaction @ age 89    Current Outpatient Medications on File Prior to Visit  Medication Sig Dispense Refill  . LORazepam (ATIVAN) 0.5 MG tablet Take 1 tablet (0.5 mg total) by mouth 2 (two) times daily as needed for anxiety. 45 tablet 1  . Multiple Vitamins-Minerals (MENS MULTIVITAMIN PLUS) TABS Take by mouth daily.    . sildenafil (VIAGRA) 100 MG tablet Take 1 tablet (100 mg total) by mouth daily as needed for erectile dysfunction. 10 tablet 0  . [DISCONTINUED]  venlafaxine XR (EFFEXOR-XR) 37.5 MG 24 hr capsule Take 1 capsule (37.5 mg total) by mouth daily with breakfast. 30 capsule 0   No current facility-administered medications on file prior to visit.     BP 110/78   Pulse 76   Temp 98.2 F (36.8 C) (Other (Comment))   Resp 16   Ht 6\' 1"  (1.854 m)   Wt 171 lb 12.8 oz (77.9 kg)   SpO2 100%   BMI 22.67 kg/m       Objective:   Physical Exam   General  Mental Status - Alert. General Appearance - Well groomed. Not in acute distress.  Skin Rashes- No Rashes.  HEENT Head- Normal. Ear Auditory Canal - Left- Normal. Right - Normal.Tympanic Membrane- Left- Normal. Right- Normal. Eye Sclera/Conjunctiva- Left- Normal. Right- Normal. Nose & Sinuses Nasal Mucosa- Left-  Boggy and Congested. Right-  Boggy and  Congested.Bilateral no maxillary and no  frontal sinus pressure. Mouth & Throat Lips: Upper Lip- Normal: no dryness, cracking, pallor, cyanosis, or vesicular eruption. Lower Lip-Normal: no dryness, cracking, pallor, cyanosis or vesicular eruption. Buccal Mucosa- Bilateral- No Aphthous ulcers. Oropharynx- No Discharge or Erythema. Tonsils: Characteristics- Bilateral- moderate Erythema. Size/Enlargement- Bilateral- No enlargement. Discharge- bilateral-None.  Neck Neck- Supple. No Masses.   Chest and Lung Exam Auscultation: Breath Sounds:-Clear even and unlabored.  Cardiovascular Auscultation:Rythm- Regular, rate and rhythm. Murmurs & Other Heart Sounds:Ausculatation of the heart reveal- No Murmurs.  Lymphatic Head & Neck General Head & Neck Lymphatics: Bilateral: Description- mild shoddy enlarged submandiblar nodes     Assessment & Plan:  By exam today and recent history, I have more clinically suspicious of potential strep throat.  I am prescribing a azithromycin antibiotic, Flonase nasal spray for congestion and benzonatate for cough.  Had plan to get both rapid strep and flu test today.  We do not have either test  available today.  We will treat for strep as stated above.  Low clinical suspicion for flu.  Note he did get a vaccine already.  Follow-up in 7 to 10 days or as needed.  Esperanza RichtersEdward Isam Unrein, PA-C

## 2018-01-17 ENCOUNTER — Encounter: Payer: Self-pay | Admitting: Family Medicine

## 2018-01-17 ENCOUNTER — Ambulatory Visit: Payer: 59 | Admitting: Family Medicine

## 2018-01-17 DIAGNOSIS — M542 Cervicalgia: Secondary | ICD-10-CM | POA: Diagnosis not present

## 2018-01-17 NOTE — Progress Notes (Signed)
PCP and consultation requested by: Esperanza RichtersSaguier, Edward, PA-C  Subjective:   HPI: Patient is a 57 y.o. male here for neck pain.  3/5: Patient reports about 3-5 years ago he accidentally landed onto his upper back and neck.  He is continued to have problems with posterior neck pain more at the base of his neck but over the past 8-10 months has had radiation of pain into his left arm all the way to the hand with left hand feeling more weak.  He has an associated numbness and cold feeling as well that is worse at nighttime.  He has a friend that is shown him some physical therapy so has been doing some home exercises.  He has trouble gripping and picking items up.  No problems with the right side.  Pain level is currently at a 2 out of 10 level. He is right-handed.  4/9: Patient reports he's doing well. Pain level 0/10 now. Rarely taking aleve. Having some difficulty picking things up with left hand but no other complaints. Doing well with physical therapy, home exercises. Pain level 0/10. No skin changes, numbness.  12/5: Patient returned today reporting continued problems with weakness in left hand. Difficulty gripping items, feels like he can't hold them. Not bad enough that he's dropping things. Also has a tremor of left hand at rest - not noted elsewhere. Does not feel his gait, speech has changed. Thinks tremor has been present a couple years. Pain level 0/10 in neck but can rarely get up to 5/10 level, sharp. No skin changes. No numbness.  Past Medical History:  Diagnosis Date  . Family history of prostate cancer     DRE WNL; PSA never > 0.38  . Fasting hyperglycemia     PMH of  . GERD (gastroesophageal reflux disease)   . Tinnitus    w/o hearing loss    Current Outpatient Medications on File Prior to Visit  Medication Sig Dispense Refill  . azithromycin (ZITHROMAX) 250 MG tablet Take 2 tablets by mouth on day 1, followed by 1 tablet by mouth daily for 4 days. 6 tablet 0   . benzonatate (TESSALON) 100 MG capsule Take 1 capsule (100 mg total) by mouth 3 (three) times daily as needed for cough. 30 capsule 0  . fluticasone (FLONASE) 50 MCG/ACT nasal spray Place 2 sprays into both nostrils daily. 16 g 1  . LORazepam (ATIVAN) 0.5 MG tablet Take 1 tablet (0.5 mg total) by mouth 2 (two) times daily as needed for anxiety. 45 tablet 1  . Multiple Vitamins-Minerals (MENS MULTIVITAMIN PLUS) TABS Take by mouth daily.    . sildenafil (VIAGRA) 100 MG tablet Take 1 tablet (100 mg total) by mouth daily as needed for erectile dysfunction. 10 tablet 0  . [DISCONTINUED] venlafaxine XR (EFFEXOR-XR) 37.5 MG 24 hr capsule Take 1 capsule (37.5 mg total) by mouth daily with breakfast. 30 capsule 0   No current facility-administered medications on file prior to visit.     Past Surgical History:  Procedure Laterality Date  . COLONOSCOPY  05/2013   Neg, Dr [name]  . KNEE SURGERY Right   . TONSILLECTOMY AND ADENOIDECTOMY    . VASECTOMY      Allergies  Allergen Reactions  . Penicillins     ? Reaction @ age 89    Social History   Socioeconomic History  . Marital status: Married    Spouse name: Not on file  . Number of children: Not on file  . Years of  education: Not on file  . Highest education level: Not on file  Occupational History  . Not on file  Social Needs  . Financial resource strain: Not on file  . Food insecurity:    Worry: Not on file    Inability: Not on file  . Transportation needs:    Medical: Not on file    Non-medical: Not on file  Tobacco Use  . Smoking status: Never Smoker  . Smokeless tobacco: Never Used  Substance and Sexual Activity  . Alcohol use: Yes    Comment: very rare wine , < one per month per pt.  . Drug use: No  . Sexual activity: Yes  Lifestyle  . Physical activity:    Days per week: Not on file    Minutes per session: Not on file  . Stress: Not on file  Relationships  . Social connections:    Talks on phone: Not on file     Gets together: Not on file    Attends religious service: Not on file    Active member of club or organization: Not on file    Attends meetings of clubs or organizations: Not on file    Relationship status: Not on file  . Intimate partner violence:    Fear of current or ex partner: Not on file    Emotionally abused: Not on file    Physically abused: Not on file    Forced sexual activity: Not on file  Other Topics Concern  . Not on file  Social History Narrative  . Not on file    Family History  Problem Relation Age of Onset  . COPD Mother        smoker  . Prostate cancer Father 61       no longer with prostate cancer  . Breast cancer Sister   . COPD Paternal Aunt        smoker  . Stroke Maternal Grandfather         late 53s  . Heart disease Neg Hx   . Colon cancer Neg Hx   . Esophageal cancer Neg Hx   . Pancreatic cancer Neg Hx   . Rectal cancer Neg Hx   . Stomach cancer Neg Hx   . Diabetes Neg Hx     BP 122/77   Pulse 63   Ht 6\' 1"  (1.854 m)   Wt 175 lb (79.4 kg)   BMI 23.09 kg/m   Review of Systems: See HPI above.     Objective:  Physical Exam:  Gen: NAD, comfortable in exam room  Neck: No gross deformity, swelling, bruising. No paraspinal TTP .  No midline/bony TTP. FROM without pain. BUE strength 5/5.   Sensation intact to light touch.   2+ equal reflexes in triceps, biceps, brachioradialis tendons. Negative spurlings. NV intact distal BUEs.  Neuro: Resting tremor of left hand, not noted of right. Ambulates in hallway without shuffling, slow but not pedestal turning.   Assessment & Plan:  1. Neck pain - Much improved with some residual subjective weakness in left hand and with grip.  Will refer to therapy for this to build on strength.  Aleve, tylenol only if he needed for pain.  F/u in 6 weeks.  2. Tremor - will refer to neurology for evaluation.  Speech and gait are a little slow but patient doesn't feel these have changed.

## 2018-01-17 NOTE — Patient Instructions (Signed)
We will put you back into physical therapy to work on the strength in your arm, grip. We will refer you to neurology to evaluate the tremor in your left hand. Follow up with me in 6 weeks for reevaluation.

## 2018-01-18 ENCOUNTER — Other Ambulatory Visit: Payer: Self-pay | Admitting: Medical

## 2018-01-18 NOTE — Telephone Encounter (Signed)
Refill request: Lorazepam   Last RX:11/19/17 Last OV:12/27/17 Next ZO:XWRUOV:None scheduled  UDS:10/22/17 CSC:03/06/17 CSR:

## 2018-01-18 NOTE — Telephone Encounter (Signed)
Rx ativan sent in. Will you print nccsr review. Place on my desk.

## 2018-01-22 NOTE — Telephone Encounter (Signed)
Printed and placed on desk

## 2018-01-23 ENCOUNTER — Other Ambulatory Visit: Payer: Self-pay

## 2018-01-23 ENCOUNTER — Ambulatory Visit: Payer: 59 | Attending: Family Medicine | Admitting: Physical Therapy

## 2018-01-23 DIAGNOSIS — M62838 Other muscle spasm: Secondary | ICD-10-CM

## 2018-01-23 DIAGNOSIS — M542 Cervicalgia: Secondary | ICD-10-CM

## 2018-01-23 DIAGNOSIS — R293 Abnormal posture: Secondary | ICD-10-CM

## 2018-01-23 DIAGNOSIS — M5412 Radiculopathy, cervical region: Secondary | ICD-10-CM | POA: Diagnosis not present

## 2018-01-23 NOTE — Therapy (Signed)
[facility] [address]  [address] [place], [place], [address] Phone: [phone]   Fax:  [phone]  Physical Therapy Evaluation  Patient Details  Name: Mark [name] MRN: [id number] Date of Birth: 1960/07/31 Referring Provider (PT): Norton Blizzard, MD   Encounter Date: 01/23/2018  PT End of Session - 01/23/18 0838    Visit Number  1    Number of Visits  12    Date for PT Re-Evaluation  03/06/18    Authorization Type  [employer]    Authorization - Number of Visits  23   for remainder of 2019   PT Start Time  0838    PT Stop Time  0937    PT Time Calculation (min)  59 min    Activity Tolerance  Patient tolerated treatment well    Behavior During Therapy  [facility] for tasks assessed/performed       Past Medical History:  Diagnosis Date  . Family history of prostate cancer     DRE WNL; PSA never > 0.38  . Fasting hyperglycemia     PMH of  . GERD (gastroesophageal reflux disease)   . Tinnitus    w/o hearing loss    Past Surgical History:  Procedure Laterality Date  . COLONOSCOPY  05/2013   Neg, Dr Marina Goodell  . KNEE SURGERY Right   . TONSILLECTOMY AND ADENOIDECTOMY    . VASECTOMY      There were no vitals filed for this visit.   Subjective Assessment - 01/23/18 0841    Subjective  Pt reporting not so much neck pain as "dull sensation" in his left arm with decreased flexibility/strength in hand for >1 yr. States hand feels stiff all the time and hard to use at times.    Patient Stated Goals  "to get my L hand feeling more normal"    Currently in Pain?  No/denies    Pain Score  0-No pain   rarely up to 4/10   Pain Location  Neck    Pain Descriptors / Indicators  Burning    Pain Type  Chronic pain    Pain Radiating Towards  "dull sensation" in L arm with occasional numbness in L hand    Pain Onset  More than a month ago    Pain Frequency  Intermittent    Aggravating Factors   fatigue with running, sometimes upon waking  in the morning    Pain Relieving Factors  resolves on its own    Effect of Pain on Daily Activities  difficulty with typing on computer with L hand         [facility] PT Assessment - 01/23/18 0838      Assessment   Medical Diagnosis  Cervical radiculopathy    Referring Provider (PT)  Norton Blizzard, MD    Onset Date/Surgical Date  --   >1 yr   Hand Dominance  Right    Next MD Visit  03/01/18    Prior Therapy  prior PT for same problem ~1 yr ago      Balance Screen   Has the patient fallen in the past 6 months  No    Has the patient had a decrease in activity level because of a fear of falling?   No    Is the patient reluctant to leave their home because of a fear of falling?   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    Leisure  running 3 days/wk      Cognition   Overall Cognitive Status  Within Functional Limits for tasks assessed      Observation/Other Assessments   Focus on Therapeutic Outcomes (FOTO)   Neck - 59% (41% limitation); Predicted 67% (33% limitation)      Coordination   Gross Motor Movements are Fluid and Coordinated  Yes    Fine Motor Movements are Fluid and Coordinated  Yes      Posture/Postural Control   Posture/Postural Control  Postural limitations    Postural Limitations  Forward head;Rounded Shoulders;Increased thoracic kyphosis      ROM / Strength   AROM / PROM / Strength  AROM;Strength      AROM   Overall AROM Comments  B shoulders WFL with mild end range limitation in flexion & abduction    AROM Assessment Site  Cervical;Shoulder    Cervical Flexion  51    Cervical Extension  48    Cervical - Right Side Bend  25    Cervical - Left Side Bend  10    Cervical - Right Rotation  70    Cervical - Left Rotation  59      Strength    Strength Assessment Site  Shoulder;Elbow;Forearm;Wrist;Hand    Right/Left Shoulder  Right;Left    Right Shoulder Flexion  4+/5    Right Shoulder ABduction  4+/5    Right Shoulder Internal Rotation  4/5    Right Shoulder External Rotation  4/5    Left Shoulder Flexion  4/5    Left Shoulder ABduction  4/5    Left Shoulder Internal Rotation  4/5    Left Shoulder External Rotation  4/5    Right/Left Elbow  Right;Left    Right Elbow Flexion  5/5    Right Elbow Extension  5/5    Left Elbow Flexion  4+/5    Left Elbow Extension  4/5    Right/Left Forearm  Right;Left    Right Forearm Pronation  4+/5    Right Forearm Supination  4+/5    Left Forearm Pronation  4+/5    Left Forearm Supination  4/5    Right/Left Wrist  Right;Left    Right Wrist Flexion  5/5    Right Wrist Extension  5/5    Right Wrist Radial Deviation  5/5    Right Wrist Ulnar Deviation  5/5    Left Wrist Flexion  4+/5    Left Wrist Extension  4+/5    Left Wrist Radial Deviation  4+/5    Left Wrist Ulnar Deviation  4+/5    Right/Left hand  Right;Left    Right Hand Gross Grasp  Functional    Right Hand Grip (lbs)  45.67   46, 43, 48   Right Hand Lateral Pinch  16.33 lbs   17,16,16   Right Hand 3 Point Pinch  11.67 lbs   11, 12, 12   Left Hand Gross Grasp  Functional    Left Hand Grip (lbs)  35.33   41, 35, 30   Left Hand Lateral Pinch  13.33 lbs   14, 13, 13   Left Hand 3 Point Pinch  10 lbs   11, 10, 9     Palpation   Palpation  comment  severely increased muscle tension t/o B upper shoulders, posterior/lateral neck and pecs                Objective measurements completed on examination: See above findings.      [facility] Adult PT Treatment/Exercise - 01/23/18 0838      Self-Care   Self-Care  Posture    Posture  Initial postural education for neutral spine and shoulder posture      Exercises   Exercises  Neck      Neck Exercises: Seated   Other Seated Exercise  B scapular retraction +  depression 10 x 5"      Neck Exercises: Supine   Neck Retraction  10 reps;5 secs      Neck Exercises: Stretches   Upper Trapezius Stretch  Right;Left;30 seconds;1 rep    Levator Stretch  Right;Left;30 seconds;1 rep    Neck Stretch  30 seconds;1 rep    Neck Stretch Limitations  B SCM stretch    Corner Stretch  30 seconds;1 rep   each   Corner Stretch Limitations  3 way doorway stretch             PT Education - 01/23/18 0935    Education Details  PT eval findings, anticipated POC, initial postural education & initial HEP    Person(s) Educated  Patient    Methods  Explanation;Demonstration;Handout    Comprehension  Verbalized understanding;Returned demonstration;Need further instruction       PT Short Term Goals - 01/23/18 1002      PT SHORT TERM GOAL #1   Title  Independent with initial HEP    Status  New    Target Date  02/13/18        PT Long Term Goals - 01/23/18 1002      PT LONG TERM GOAL #1   Title  Independent with advanced/ongoing HEP    Status  New    Target Date  03/06/18      PT LONG TERM GOAL #2   Title  Patient to demonstrate cervical AROM to [facility] in all planes without pain or tightness    Status  New    Target Date  03/06/18      PT LONG TERM GOAL #3   Title  Patient to demonstrate good postural awareness and body mechanics as it relates to his daily activities.    Status  New    Target Date  03/06/18      PT LONG TERM GOAL #4   Title  Patient to demonstrate L proximal UE strength essentially symmetrical to R and L grip strength within 5# of R    Status  New    Target Date  03/06/18             Plan - 01/23/18 0946    Clinical Impression Statement  [name] is a 57 y/o male presenting to OP PT today for cervical radiculopathy with L UE weakness. Patient reporting minimal neck pain, typically only with running long distances as well as with prolonged sitting at desk for work duties which will resolve on its own.  Patient's primary  complaint is a "dull sensation" in his left arm with decreased flexibility/strength in hand for >1 yr, stating L hand feels stiff all the time and hard to use at times.  Patient demonstrates significantly rounded shoulder and forward head posture with increased postural kyphosis, severely increased muscle tension t/o B upper shoulders, posterior/lateral neck and pecs, as well as  reduced strength of mid-back/periscapular musculature - all likely contributing to pain and radicular symptoms. Mild decreased L UE strength noted relative to R. Initial HEP provided today for gentle stretching and strengthening for neck and upper back with good tolerance and return demonstration. Patient to benefit from skilled PT intervention to address above deficits and current functional limitations.    Clinical Presentation  Stable    Clinical Decision Making  Low    Rehab Potential  Good    PT Frequency  2x / week   1-2x/wk dependent on pt's work schedule   PT Duration  6 weeks    PT Treatment/Interventions  Patient/family education;Neuromuscular re-education;Therapeutic exercise;Therapeutic activities;ADLs/Self Care Home Management;Manual techniques;Passive range of motion;Dry needling;Taping;Electrical Stimulation;Moist Heat;Ultrasound;Traction;Iontophoresis 4mg /ml Dexamethasone    Consulted and Agree with Plan of Care  Patient       Patient will benefit from skilled therapeutic intervention in order to improve the following deficits and impairments:  Postural dysfunction, Improper body mechanics, Increased muscle spasms, Decreased range of motion, Decreased strength, Impaired sensation, Impaired UE functional use, Decreased activity tolerance, Pain  Visit Diagnosis: Radiculopathy, cervical region  Abnormal posture  Other muscle spasm  Cervicalgia     Problem List Patient Active Problem List   Diagnosis Date Noted  . Neck pain 04/19/2017  . Anxiety state 04/28/2015  . Wellness examination 03/24/2015   . Acute pharyngitis 03/30/2014  . Intercostal muscle tear 06/20/2013  . Nonspecific abnormal electrocardiogram (ECG) (EKG) 03/08/2011  . GERD 11/14/2007  . ANXIETY STATE NOS 12/11/2006  . Hyperglycemia 09/06/2006    Marry Guan, PT, MPT 01/23/2018, 10:09 AM  [facility] [address]  [address] [place], [place], [address] Phone: [phone]   Fax:  [phone]  Name: Mark [name] MRN: [id number] Date of Birth: 01-02-1961

## 2018-01-25 ENCOUNTER — Ambulatory Visit: Payer: 59 | Admitting: Physical Therapy

## 2018-01-25 ENCOUNTER — Encounter: Payer: Self-pay | Admitting: Physical Therapy

## 2018-01-25 DIAGNOSIS — M5412 Radiculopathy, cervical region: Secondary | ICD-10-CM

## 2018-01-25 DIAGNOSIS — M62838 Other muscle spasm: Secondary | ICD-10-CM

## 2018-01-25 DIAGNOSIS — R293 Abnormal posture: Secondary | ICD-10-CM

## 2018-01-25 DIAGNOSIS — M542 Cervicalgia: Secondary | ICD-10-CM

## 2018-01-25 NOTE — Therapy (Signed)
[facility] [address]  [address] [place], [place], [address] Phone: [phone]   Fax:  [phone]  Physical Therapy Treatment  Patient Details  Name: Mark [name] MRN: [id number] Date of Birth: 1960/03/22 Referring Provider (PT): Norton Blizzard, MD   Encounter Date: 01/25/2018  PT End of Session - 01/25/18 0847    Visit Number  2    Number of Visits  12    Date for PT Re-Evaluation  03/06/18    Authorization Type  UHC    Authorization - Number of Visits  23   for remainder of 2019   PT Start Time  0847    PT Stop Time  0930    PT Time Calculation (min)  43 min    Activity Tolerance  Patient tolerated treatment well    Behavior During Therapy  [facility] for tasks assessed/performed       Past Medical History:  Diagnosis Date  . Family history of prostate cancer     DRE WNL; PSA never > 0.38  . Fasting hyperglycemia     PMH of  . GERD (gastroesophageal reflux disease)   . Tinnitus    w/o hearing loss    Past Surgical History:  Procedure Laterality Date  . COLONOSCOPY  05/2013   Neg, Dr Marina Goodell  . KNEE SURGERY Right   . TONSILLECTOMY AND ADENOIDECTOMY    . VASECTOMY      There were no vitals filed for this visit.  Subjective Assessment - 01/25/18 0851    Subjective  Pt reporting he has been completing HEP and no issues noted.    Patient Stated Goals  "to get my L hand feeling more normal"    Currently in Pain?  No/denies    Pain Onset  More than a month ago                       [facility] Adult PT Treatment/Exercise - 01/25/18 0847      Exercises   Exercises  Neck      Neck Exercises: Machines for Strengthening   UBE (Upper Arm Bike)  L2.5 x 6 min (3'  fwd/3' back)      Neck Exercises: Theraband   Shoulder Extension  15 reps;Red    Shoulder Extension Limitations  standing - cues for scap retraction & depression    Rows  15 reps;Red    Rows Limitations  standing - cues for scap  retraction & depression    Shoulder External Rotation  15 reps;Red    Shoulder External Rotation Limitations  hooklying on pool noodle - cues for scap retraction    Horizontal ABduction  15 reps;Red    Horizontal ABduction Limitations  hooklying on pool noodle - cues for scap retraction    Other Theraband Exercises  Alt UE diagonals + scap retraction with red TB x15 - hooklying on pool noodle      Neck Exercises: Seated   Other Seated Exercise  B scapular retraction + depression 10 x 5"      Neck Exercises: Supine   Neck Retraction  10 reps;5 secs    Neck Retraction Limitations  hooklying on pool noodle with pillow under head      Hand Exercises for Cervical Radiculopathy   Gross Grasp  Stovall hand grip - yellow spring 35# x15      Neck Exercises: Stretches   Upper Trapezius Stretch  Right;Left;30 seconds;1 rep    Upper  Trapezius Stretch Limitations  cues to keep shoulder down on side of stretch    Levator Stretch  Right;Left;30 seconds;1 rep    Neck Stretch  30 seconds;1 rep    Neck Stretch Limitations  B SCM stretch    Corner Stretch  30 seconds;1 rep   each   Corner Stretch Limitations  3 way doorway stretch    Chest Stretch  60 seconds    Chest Stretch Limitations  hooklying pec stretch over pool noodle - varying arm position             PT Education - 01/25/18 0930    Education Details  HEP update - red theraband scap retraction exercises    Person(s) Educated  Patient    Methods  Explanation;Demonstration;Handout    Comprehension  Verbalized understanding;Returned demonstration;Need further instruction       PT Short Term Goals - 01/25/18 0851      PT SHORT TERM GOAL #1   Title  Independent with initial HEP    Status  On-going        PT Long Term Goals - 01/25/18 1610      PT LONG TERM GOAL #1   Title  Independent with advanced/ongoing HEP    Status  On-going      PT LONG TERM GOAL #2   Title  Patient to demonstrate cervical AROM to [facility] in all  planes without pain or tightness    Status  On-going      PT LONG TERM GOAL #3   Title  Patient to demonstrate good postural awareness and body mechanics as it relates to his daily activities.    Status  On-going      PT LONG TERM GOAL #4   Title  Patient to demonstrate L proximal UE strength essentially symmetrical to R and L grip strength within 5# of R    Status  On-going            Plan - 01/25/18 9604    Clinical Impression Statement  Initial HEP reviewed with pt demonstrating good recall and technique with all exercises, with only cueing to avoid shoulder hike during UT stretch necessary. Progressed postural strengthening with emphasis on anterior stretching and scapular strengthening/stabilization with good tolerance - HEP updated accordingly.    Rehab Potential  Good    PT Frequency  2x / week   1-2x/wk dependent on pt's work schedule   PT Duration  6 weeks    PT Treatment/Interventions  Patient/family education;Neuromuscular re-education;Therapeutic exercise;Therapeutic activities;ADLs/Self Care Home Management;Manual techniques;Passive range of motion;Dry needling;Taping;Electrical Stimulation;Moist Heat;Ultrasound;Traction;Iontophoresis 4mg /ml Dexamethasone    PT Next Visit Plan  Review updated HEP; Progress postural strengthening as tolerated; Progress grip strengthening: UE nerve glides as indicated for radiculopathy; Manual therapy including possible DN for increased muscle tension    Consulted and Agree with Plan of Care  Patient       Patient will benefit from skilled therapeutic intervention in order to improve the following deficits and impairments:  Postural dysfunction, Improper body mechanics, Increased muscle spasms, Decreased range of motion, Decreased strength, Impaired sensation, Impaired UE functional use, Decreased activity tolerance, Pain  Visit Diagnosis: Radiculopathy, cervical region  Abnormal posture  Other muscle  spasm  Cervicalgia     Problem List Patient Active Problem List   Diagnosis Date Noted  . Neck pain 04/19/2017  . Anxiety state 04/28/2015  . Wellness examination 03/24/2015  . Acute pharyngitis 03/30/2014  . Intercostal muscle tear 06/20/2013  . Nonspecific abnormal  electrocardiogram (ECG) (EKG) 03/08/2011  . GERD 11/14/2007  . ANXIETY STATE NOS 12/11/2006  . Hyperglycemia 09/06/2006    Marry GuanJoAnne M Kreis, PT, MPT 01/25/2018, 12:35 PM  [facility] [place] [address]  [address] [place], [place], [address] Phone: [phone]   Fax:  [phone]  Name: Megan MansJeffrey Barbee MRN: [id number] Date of Birth: 02-20-1960

## 2018-01-29 ENCOUNTER — Encounter: Payer: Self-pay | Admitting: Physical Therapy

## 2018-01-29 ENCOUNTER — Ambulatory Visit: Payer: 59 | Admitting: Physical Therapy

## 2018-01-29 DIAGNOSIS — M5412 Radiculopathy, cervical region: Secondary | ICD-10-CM

## 2018-01-29 DIAGNOSIS — M542 Cervicalgia: Secondary | ICD-10-CM

## 2018-01-29 DIAGNOSIS — R293 Abnormal posture: Secondary | ICD-10-CM

## 2018-01-29 DIAGNOSIS — M62838 Other muscle spasm: Secondary | ICD-10-CM

## 2018-01-29 NOTE — Therapy (Signed)
[facility] [address]  [address] [place], [place], [address] Phone: [phone]   Fax:  [phone]  Physical Therapy Treatment  Patient Details  Name: Mark [name] MRN: [id number] Date of Birth: 04/14/1960 Referring Provider (PT): Norton Blizzard, MD   Encounter Date: 01/29/2018  PT End of Session - 01/29/18 0801    Visit Number  3    Number of Visits  12    Date for PT Re-Evaluation  03/06/18    Authorization Type  UHC    Authorization - Number of Visits  23   for remainder of 2019   PT Start Time  0801    PT Stop Time  0848    PT Time Calculation (min)  47 min    Activity Tolerance  Patient tolerated treatment well    Behavior During Therapy  [facility] for tasks assessed/performed       Past Medical History:  Diagnosis Date  . Family history of prostate cancer     DRE WNL; PSA never > 0.38  . Fasting hyperglycemia     PMH of  . GERD (gastroesophageal reflux disease)   . Tinnitus    w/o hearing loss    Past Surgical History:  Procedure Laterality Date  . COLONOSCOPY  05/2013   Neg, Dr Marina Goodell  . KNEE SURGERY Right   . TONSILLECTOMY AND ADENOIDECTOMY    . VASECTOMY      There were no vitals filed for this visit.  Subjective Assessment - 01/29/18 0802    Subjective  Pt doing well. No new complaints or issues today.    Patient Stated Goals  "to get my L hand feeling more normal"    Currently in Pain?  No/denies    Pain Onset  More than a month ago                       [facility] Adult PT Treatment/Exercise - 01/29/18 0801      Neck Exercises: Machines for Strengthening   UBE (Upper Arm Bike)  L3.0 x 6 min (3'  fwd/3' back)      Neck Exercises: Theraband   Shoulder Extension  15 reps;Red   5" hold   Shoulder Extension Limitations  standing - cues for scap retraction & depression    Rows  15 reps;Red   5" hold   Rows Limitations  standing - cues for scap retraction & depression as well as  increased hold time    Shoulder External Rotation  15 reps;Red   3-5" hold with doubled red TB   Shoulder External Rotation Limitations  hooklying on pool noodle - cues for scap retraction    Horizontal ABduction  15 reps;Red   3-5" hold with doubled red TB   Horizontal ABduction Limitations  hooklying on pool noodle - cues for scap retraction    Other Theraband Exercises  Alt UE diagonals + scap retraction with doubled red TB x15 - hooklying on pool noodle      Hand Exercises for Cervical Radiculopathy   Gross Grasp  L hand red Digi-Flex gross grasp and isolated finger flexion x 10 each    Other Hand Exercise for Cervical Radiculopathy  Yellow putty - gross grip/squeeze, isolated full finger flexion into palm and multi-finger extension spread on table x 10 each      Manual Therapy   Manual Therapy  Soft tissue mobilization;Myofascial release;Passive ROM;Manual Traction    Manual therapy comments  hooklying    Soft tissue mobilization  STM to B UT/LS, B cervical paraspinals & SCM, B suboccipital mm    Myofascial Release  manual TPR to B UT    Passive ROM  gentle cervical ROM/stretch in all directions, manual passive shoulder depression/retraction over pool noodle    Manual Traction  gentle manual distraction with cervical retraction 3 x 30 sec             PT Education - 01/29/18 0845    Education Details  Yellow theraputty provided for home exercises    Person(s) Educated  Patient    Methods  Explanation;Demonstration    Comprehension  Verbalized understanding;Returned demonstration;Need further instruction       PT Short Term Goals - 01/25/18 0851      PT SHORT TERM GOAL #1   Title  Independent with initial HEP    Status  On-going        PT Long Term Goals - 01/25/18 0454      PT LONG TERM GOAL #1   Title  Independent with advanced/ongoing HEP    Status  On-going      PT LONG TERM GOAL #2   Title  Patient to demonstrate cervical AROM to [facility] in all planes without  pain or tightness    Status  On-going      PT LONG TERM GOAL #3   Title  Patient to demonstrate good postural awareness and body mechanics as it relates to his daily activities.    Status  On-going      PT LONG TERM GOAL #4   Title  Patient to demonstrate L proximal UE strength essentially symmetrical to R and L grip strength within 5# of R    Status  On-going            Plan - 01/29/18 0803    Clinical Impression Statement  Scapular strengthening HEP reviewed with pt able to perform all exercises with good technique other than slight cueing to increase scapular activation with rows and B shoulder extension. Remainder of session focusing on L hand/grip strengthening with pt demonstrating some difficulty isolating individual finger flexion & extension. Yellow theraputty provided for home use.    Rehab Potential  Good    PT Frequency  2x / week   1-2x/wk dependent on pt's work schedule   PT Duration  6 weeks    PT Treatment/Interventions  Patient/family education;Neuromuscular re-education;Therapeutic exercise;Therapeutic activities;ADLs/Self Care Home Management;Manual techniques;Passive range of motion;Dry needling;Taping;Electrical Stimulation;Moist Heat;Ultrasound;Traction;Iontophoresis 4mg /ml Dexamethasone    PT Next Visit Plan  Progress postural strengthening as tolerated; Progress grip strengthening; UE nerve glides as indicated for radiculopathy; Manual therapy including possible DN for increased muscle tension    Consulted and Agree with Plan of Care  Patient       Patient will benefit from skilled therapeutic intervention in order to improve the following deficits and impairments:  Postural dysfunction, Improper body mechanics, Increased muscle spasms, Decreased range of motion, Decreased strength, Impaired sensation, Impaired UE functional use, Decreased activity tolerance, Pain  Visit Diagnosis: Radiculopathy, cervical region  Abnormal posture  Other muscle  spasm  Cervicalgia     Problem List Patient Active Problem List   Diagnosis Date Noted  . Neck pain 04/19/2017  . Anxiety state 04/28/2015  . Wellness examination 03/24/2015  . Acute pharyngitis 03/30/2014  . Intercostal muscle tear 06/20/2013  . Nonspecific abnormal electrocardiogram (ECG) (EKG) 03/08/2011  . GERD 11/14/2007  . ANXIETY STATE NOS 12/11/2006  .  Hyperglycemia 09/06/2006    Marry GuanJoAnne M Mariadejesus Cade, PT, MPT 01/29/2018, 11:49 AM  [name] [place] [facility] [address]  [address] [place], [place], [address] Phone: [phone]   Fax:  [phone]  Name: Mark [name] MRN: [id number] Date of Birth: Nov 10, 1960

## 2018-01-30 ENCOUNTER — Encounter: Payer: Self-pay | Admitting: Neurology

## 2018-01-30 ENCOUNTER — Ambulatory Visit: Payer: 59 | Admitting: Neurology

## 2018-01-30 ENCOUNTER — Telehealth: Payer: Self-pay

## 2018-01-30 NOTE — Telephone Encounter (Signed)
Pt did not show for their appt with Dr. Athar today.  

## 2018-02-01 ENCOUNTER — Encounter: Payer: 59 | Admitting: Physical Therapy

## 2018-02-04 ENCOUNTER — Ambulatory Visit: Payer: 59 | Admitting: Physical Therapy

## 2018-02-14 ENCOUNTER — Encounter: Payer: Self-pay | Admitting: Physical Therapy

## 2018-02-14 ENCOUNTER — Other Ambulatory Visit: Payer: Self-pay | Admitting: Medical

## 2018-02-14 ENCOUNTER — Ambulatory Visit: Payer: 59 | Attending: Family Medicine | Admitting: Physical Therapy

## 2018-02-14 DIAGNOSIS — R293 Abnormal posture: Secondary | ICD-10-CM | POA: Diagnosis present

## 2018-02-14 DIAGNOSIS — M5412 Radiculopathy, cervical region: Secondary | ICD-10-CM | POA: Diagnosis not present

## 2018-02-14 DIAGNOSIS — M542 Cervicalgia: Secondary | ICD-10-CM | POA: Diagnosis present

## 2018-02-14 DIAGNOSIS — M62838 Other muscle spasm: Secondary | ICD-10-CM | POA: Diagnosis present

## 2018-02-14 NOTE — Patient Instructions (Signed)

## 2018-02-14 NOTE — Therapy (Signed)
[facility] [address]  [address] [place], [place], [address] Phone: [phone]   Fax:  [phone]  Physical Therapy Treatment  Patient Details  Name: Mark [name] MRN: [id number] Date of Birth: 04-Nov-1960 Referring Provider (PT): Norton Blizzard, MD   Encounter Date: 02/14/2018  PT End of Session - 02/14/18 1311    Visit Number  4    Number of Visits  12    Date for PT Re-Evaluation  03/06/18    Authorization Type  UHC    PT Start Time  1311    PT Stop Time  1356    PT Time Calculation (min)  45 min    Activity Tolerance  Patient tolerated treatment well    Behavior During Therapy  [facility] for tasks assessed/performed       Past Medical History:  Diagnosis Date  . Family history of prostate cancer     DRE WNL; PSA never > 0.38  . Fasting hyperglycemia     PMH of  . GERD (gastroesophageal reflux disease)   . Tinnitus    w/o hearing loss    Past Surgical History:  Procedure Laterality Date  . COLONOSCOPY  05/2013   Neg, Dr Marina Goodell  . KNEE SURGERY Right   . TONSILLECTOMY AND ADENOIDECTOMY    . VASECTOMY      There were no vitals filed for this visit.  Subjective Assessment - 02/14/18 1313    Subjective  Pt reports pain has been mostly non-existent and feels like stiffness is improving. States he has started doubling the red theraband at home to add increased resistance.    Patient Stated Goals  "to get my L hand feeling more normal"    Currently in Pain?  No/denies    Pain Onset  More than a month ago                       [facility] Adult PT Treatment/Exercise - 02/14/18 1311      Self-Care   Self-Care  Posture    Posture  Provided education on proper posture & body mechanics with typical daily tasks and activities.      Exercises   Exercises  Neck      Neck Exercises: Machines for Strengthening   UBE (Upper Arm Bike)  L3.0 x 6 min (3' fwd/3' back)    Cybex Row  20# x 15 - cues for upright  posture & abdominal bracing with emphasis on scap retraction    Lat Pull  15# x 15 - cues for scap retraction & depression      Neck Exercises: Theraband   Shoulder Extension  15 reps;Green   5" hold   Shoulder Extension Limitations  standing - cues for scap retraction & depression    Rows  15 reps;Green   5" hold   Rows Limitations  standing - good technique      Neck Exercises: Prone   W Back  10 reps    W Back Limitations  W's over green Pball - cues for cervical retraction & thoracic extension    Shoulder Extension  10 reps    Shoulder Extension Limitations  I's over green Pball - cues for cervical retraction & thoracic extension    Other Prone Exercise  T's & Y's over green Pball x 10 each - cues for cervical retraction & thoracic extension      Hand Exercises for Cervical Radiculopathy   Gross Grasp  L hand red Digi-Flex: gross grasp and isolated finger flexion x 10 each    Pinch Grip  L hand red Digi-Flex: lateral pinch & 3 point pinch x 10 each             PT Education - 02/14/18 1354    Education Details  Posture & body mechanics education with typical daily tasks; Row & scap retraction/shoulder extension increased to green TB    Person(s) Educated  Patient    Methods  Explanation;Demonstration;Handout    Comprehension  Verbalized understanding;Returned demonstration       PT Short Term Goals - 02/14/18 1319      PT SHORT TERM GOAL #1   Title  Independent with initial HEP    Status  Achieved        PT Long Term Goals - 02/14/18 1319      PT LONG TERM GOAL #1   Title  Independent with advanced/ongoing HEP    Status  On-going      PT LONG TERM GOAL #2   Title  Patient to demonstrate cervical AROM to [facility] in all planes without pain or tightness    Status  On-going      PT LONG TERM GOAL #3   Title  Patient to demonstrate good postural awareness and body mechanics as it relates to his daily activities.    Status  On-going      PT LONG TERM GOAL #4    Title  Patient to demonstrate L proximal UE strength essentially symmetrical to R and L grip strength within 5# of R    Status  On-going            Plan - 02/14/18 1316    Clinical Impression Statement  Mark [name] reporting no issues with HEP, altough admits to focusing on therabance exercises and has not been doing stretches as much - reinforced need for consistent compliance with stretches and postural exercises throughout the day as well as provided furhter instuction in proper posture & body mechanics with typical daily tasks. Able to progress theraband resistance with rows and scap retraction/shoudler extension to green TB with good tolerance. Progressed scapular strengthening and thoracic extension/cervical retraction with prone exercise over Pball with some faitgue noted and repeated cues necessary for proper movement patterns.    Rehab Potential  Good    PT Frequency  2x / week   1-2x/wk dependent on pt's work schedule   PT Duration  6 weeks    PT Treatment/Interventions  Patient/family education;Neuromuscular re-education;Therapeutic exercise;Therapeutic activities;ADLs/Self Care Home Management;Manual techniques;Passive range of motion;Dry needling;Taping;Electrical Stimulation;Moist Heat;Ultrasound;Traction;Iontophoresis 4mg /ml Dexamethasone    PT Next Visit Plan  Progress postural strengthening as tolerated; Progress grip strengthening; UE nerve glides as indicated for radiculopathy; Manual therapy including possible DN for increased muscle tension    Consulted and Agree with Plan of Care  Patient       Patient will benefit from skilled therapeutic intervention in order to improve the following deficits and impairments:  Postural dysfunction, Improper body mechanics, Increased muscle spasms, Decreased range of motion, Decreased strength, Impaired sensation, Impaired UE functional use, Decreased activity tolerance, Pain  Visit Diagnosis: Radiculopathy, cervical region  Abnormal  posture  Other muscle spasm  Cervicalgia     Problem List Patient Active Problem List   Diagnosis Date Noted  . Neck pain 04/19/2017  . Anxiety state 04/28/2015  . Wellness examination 03/24/2015  . Acute pharyngitis 03/30/2014  . Intercostal muscle tear 06/20/2013  . Nonspecific abnormal electrocardiogram (  ECG) (EKG) 03/08/2011  . GERD 11/14/2007  . ANXIETY STATE NOS 12/11/2006  . Hyperglycemia 09/06/2006    Marry GuanJoAnne M Iden Stripling, PT, MPT 02/14/2018, 2:13 PM  [facility] [place] [facility] [address]  [address] [place], [place], [address] Phone: [phone]   Fax:  [phone]  Name: Mark MansJeffrey [name] MRN: [id number] Date of Birth: [birth year]

## 2018-02-15 ENCOUNTER — Encounter: Payer: Self-pay | Admitting: Medical

## 2018-02-15 ENCOUNTER — Encounter: Payer: 59 | Admitting: Physical Therapy

## 2018-02-15 NOTE — Telephone Encounter (Signed)
Refill Request: Lorazepam  Last RX:01/18/18 Last OV: 12/27/17 Next NM:[facility] scheduled  UDS:03/06/17 CSC:10/22/17 CSR:

## 2018-02-17 ENCOUNTER — Telehealth: Payer: Self-pay | Admitting: Medical

## 2018-02-17 MED ORDER — LORAZEPAM 0.5 MG PO TABS: ORAL_TABLET | ORAL | 0 refills | Status: DC

## 2018-02-17 NOTE — Telephone Encounter (Signed)
Rx ativan sent to pharmacy. 

## 2018-02-18 ENCOUNTER — Encounter: Payer: Self-pay | Admitting: Medical

## 2018-02-18 ENCOUNTER — Ambulatory Visit: Payer: 59 | Admitting: Physical Therapy

## 2018-02-18 NOTE — Telephone Encounter (Signed)
Refilled ativan rx last night.

## 2018-02-19 ENCOUNTER — Encounter: Payer: Self-pay | Admitting: Physical Therapy

## 2018-02-20 ENCOUNTER — Ambulatory Visit: Payer: 59 | Admitting: Physical Therapy

## 2018-02-20 ENCOUNTER — Encounter: Payer: Self-pay | Admitting: Physical Therapy

## 2018-02-20 DIAGNOSIS — M5412 Radiculopathy, cervical region: Secondary | ICD-10-CM

## 2018-02-20 DIAGNOSIS — M62838 Other muscle spasm: Secondary | ICD-10-CM

## 2018-02-20 DIAGNOSIS — M542 Cervicalgia: Secondary | ICD-10-CM

## 2018-02-20 DIAGNOSIS — R293 Abnormal posture: Secondary | ICD-10-CM

## 2018-02-20 NOTE — Therapy (Signed)
[facility] [address]  [address] [place], [place], [address] Phone: [phone]   Fax:  [phone]  Physical Therapy Treatment  Patient Details  Name: Mark [name] MRN: [id number] Date of Birth: April 30, 1960 Referring Provider (PT): Norton BlizzardShane Hudnall, MD   Encounter Date: 02/20/2018  PT End of Session - 02/20/18 0802    Visit Number  5    Number of Visits  12    Date for PT Re-Evaluation  03/06/18    Authorization Type  UHC    PT Start Time  0802    PT Stop Time  0845    PT Time Calculation (min)  43 min    Activity Tolerance  Patient tolerated treatment well    Behavior During Therapy  [facility] for tasks assessed/performed       Past Medical History:  Diagnosis Date  . Family history of prostate cancer     DRE WNL; PSA never > 0.38  . Fasting hyperglycemia     PMH of  . GERD (gastroesophageal reflux disease)   . Tinnitus    w/o hearing loss    Past Surgical History:  Procedure Laterality Date  . COLONOSCOPY  05/2013   Neg, Dr [name]  . KNEE SURGERY Right   . TONSILLECTOMY AND ADENOIDECTOMY    . VASECTOMY      There were no vitals filed for this visit.  Subjective Assessment - 02/20/18 0805    Subjective  Pt feels like he's getting more flexibility in his hand, esp when he is typing.    Patient Stated Goals  "to get my L hand feeling more normal"    Currently in Pain?  No/denies    Pain Onset  More than a month ago                       [facility] Adult [facility] Treatment/Exercise - 02/20/18 0802      Neck Exercises: Machines for Strengthening   UBE (Upper Arm Bike)  L3.0 x 6 min (3' fwd/3' back)      Neck Exercises: Theraband   Shoulder External Rotation  15 reps;Red   3-5" hold   Shoulder External Rotation Limitations  standing against 6" FR on wall - cues for scap retraction & depression, avoiding shoulder hike    Horizontal ABduction  15 reps;Red   3-5" hold    Horizontal ABduction Limitations   standing against 6" FR on wall - cues for scap retraction    Other Theraband Exercises  Alt UE diagonals + scap retraction with doubled red TB x15 - standing against 6" FR on wall - cues for scap retraction into FR      Neck Exercises: Standing   Wall Push Ups  10 reps    Other Standing Exercises  Serratus pushup x 10      Neck Exercises: Prone   Axial Exension  10 reps    Axial Extension Limitations  POE    Neck Retraction  10 reps    Neck Retraction Limitations  POE      Manual Therapy   Manual Therapy  Soft tissue mobilization;Myofascial release;Passive ROM;Manual Traction    Manual therapy comments  hooklying    Soft tissue mobilization  STM to B UT/LS, B cervical paraspinals, scalenes, SCM & pecs, B suboccipital mm    Myofascial Release  manual suboccipital release    Passive ROM  gentle cervical ROM/stretch in all directions, manual passive shoulder depression/retraction  over pool noodle    Manual Traction  gentle manual distraction with cervical retraction 3 x 30 sec      Neck Exercises: Stretches   Neck Stretch  30 seconds;1 rep    Neck Stretch Limitations  B SCM/scalenes stretch - towel over shoulder for clavicle & shoulder depression               PT Short Term Goals - 02/14/18 1319      PT SHORT TERM GOAL #1   Title  Independent with initial HEP    Status  Achieved        PT Long Term Goals - 02/14/18 1319      PT LONG TERM GOAL #1   Title  Independent with advanced/ongoing HEP    Status  On-going      PT LONG TERM GOAL #2   Title  Patient to demonstrate cervical AROM to [facility] in all planes without pain or tightness    Status  On-going      PT LONG TERM GOAL #3   Title  Patient to demonstrate good postural awareness and body mechanics as it relates to his daily activities.    Status  On-going      PT LONG TERM GOAL #4   Title  Patient to demonstrate L proximal UE strength essentially symmetrical to R and L grip strength within 5# of R    Status   On-going            Plan - 02/20/18 0807    Clinical Impression Statement  Mark [name] noting improving flexibility in his hand with improved ability to perform typing tasks at work. Did note an epsiode of increased mid-thoracic pain after trying to lift something out through the back window of his wife's SUV over the weekend, but states pain seems to have resolved. Progressed supine scapular retraction exercises to standing today and added prone exercises with pt noting some increased fatigue but no pain. Pt reporting yellow putty getting easy at home, therefore provided pink/red putty for home and briefly verbally reviewed putty exercises.    Rehab Potential  Good    PT Frequency  2x / week   1-2x/wk dependent on pt's work schedule   PT Duration  6 weeks    PT Treatment/Interventions  Patient/family education;Neuromuscular re-education;Therapeutic exercise;Therapeutic activities;ADLs/Self Care Home Management;Manual techniques;Passive range of motion;Dry needling;Taping;Electrical Stimulation;Moist Heat;Ultrasound;Traction;Iontophoresis 4mg /ml Dexamethasone    PT Next Visit Plan  Progress postural strengthening as tolerated; Progress grip strengthening; UE nerve glides as indicated for radiculopathy; Manual therapy including possible DN for increased muscle tension    Consulted and Agree with Plan of Care  Patient       Patient will benefit from skilled therapeutic intervention in order to improve the following deficits and impairments:  Postural dysfunction, Improper body mechanics, Increased muscle spasms, Decreased range of motion, Decreased strength, Impaired sensation, Impaired UE functional use, Decreased activity tolerance, Pain  Visit Diagnosis: Radiculopathy, cervical region  Abnormal posture  Other muscle spasm  Cervicalgia     Problem List Patient Active Problem List   Diagnosis Date Noted  . Neck pain 04/19/2017  . Anxiety state 04/28/2015  . Wellness examination  03/24/2015  . Acute pharyngitis 03/30/2014  . Intercostal muscle tear 06/20/2013  . Nonspecific abnormal electrocardiogram (ECG) (EKG) 03/08/2011  . GERD 11/14/2007  . ANXIETY STATE NOS 12/11/2006  . Hyperglycemia 09/06/2006    Marry Guan, PT, MPT 02/20/2018, 9:59 AM  [facility] [address]  [address] [place], [place], [address] Phone: [phone]   Fax:  [phone]  Name: Mark [name] MRN: [id number] Date of Birth: Jun 14, 1960

## 2018-02-22 ENCOUNTER — Ambulatory Visit: Payer: 59 | Admitting: Physical Therapy

## 2018-02-22 ENCOUNTER — Encounter: Payer: Self-pay | Admitting: Physical Therapy

## 2018-02-22 DIAGNOSIS — R293 Abnormal posture: Secondary | ICD-10-CM

## 2018-02-22 DIAGNOSIS — M5412 Radiculopathy, cervical region: Secondary | ICD-10-CM | POA: Diagnosis not present

## 2018-02-22 DIAGNOSIS — Z0289 Encounter for other administrative examinations: Secondary | ICD-10-CM

## 2018-02-22 DIAGNOSIS — M62838 Other muscle spasm: Secondary | ICD-10-CM

## 2018-02-22 DIAGNOSIS — M542 Cervicalgia: Secondary | ICD-10-CM

## 2018-02-22 NOTE — Therapy (Signed)
[facility] [address]  [address] [place], [place], [address] Phone: [phone]   Fax:  [phone]  Physical Therapy Treatment  Patient Details  Name: Mark [name] MRN: [id number] Date of Birth: 1960/09/27 Referring Provider (PT): Norton Blizzard, MD   Encounter Date: 02/22/2018  PT End of Session - 02/22/18 0852    Visit Number  6    Number of Visits  12    Date for PT Re-Evaluation  03/06/18    Authorization Type  UHC    PT Start Time  [id number]    PT Stop Time  0930    PT Time Calculation (min)  43 min    Activity Tolerance  Patient tolerated treatment well    Behavior During Therapy  [facility] for tasks assessed/performed       Past Medical History:  Diagnosis Date  . Family history of prostate cancer     DRE WNL; PSA never > 0.38  . Fasting hyperglycemia     PMH of  . GERD (gastroesophageal reflux disease)   . Tinnitus    w/o hearing loss    Past Surgical History:  Procedure Laterality Date  . COLONOSCOPY  05/2013   Neg, Dr Marina Goodell  . KNEE SURGERY Right   . TONSILLECTOMY AND ADENOIDECTOMY    . VASECTOMY      There were no vitals filed for this visit.  Subjective Assessment - 02/22/18 0851    Subjective  Pt noting increased soreness after going to the gym and working on row and pull-downs at ~45#.    Patient Stated Goals  "to get my L hand feeling more normal"    Currently in Pain?  No/denies    Pain Onset  More than a month ago                       [facility] Adult PT Treatment/Exercise - 02/22/18 0847      Exercises   Exercises  Neck      Neck Exercises: Machines for Strengthening   UBE (Upper Arm Bike)  L3.0 x 6 min (3' fwd/3' back)      Neck Exercises: Standing   Upper Extremity D1  10 reps;Flexion;Extension;Theraband    Theraband Level (UE D1)  Level 1 (Yellow)    Upper Extremity D2  10 reps;Flexion;Extension;Theraband    Theraband Level (UE D2)  Level 1 (Yellow)      Neck  Exercises: Prone   Plank  Modified plank elbows to knees 5 x 10-15 sec    Other Prone Exercise  Quadruped alt UE flexion x 10 each      Hand Exercises for Cervical Radiculopathy   Gross Grasp  Stovall hand grip - yellow spring 50# x15    Finger ABduction  r:grip - composite L finger extension/abduction x15    Other Hand Exercise for Cervical Radiculopathy  Rubberbands - L finger extension (blue) & isolated abduction (black) x 10 each    Other Hand Exercise for Cervical Radiculopathy  small pegboard x 3 rows               PT Short Term Goals - 02/14/18 1319      PT SHORT TERM GOAL #1   Title  Independent with initial HEP    Status  Achieved        PT Long Term Goals - 02/14/18 1319      PT LONG TERM GOAL #1   Title  Independent  with advanced/ongoing HEP    Status  On-going      PT LONG TERM GOAL #2   Title  Patient to demonstrate [facility] planes without pain or tightness    Status  On-going      PT LONG TERM GOAL #3   Title  Patient to demonstrate good postural awareness and body mechanics as it relates to his daily activities.    Status  On-going      PT LONG TERM GOAL #4   Title  Patient to demonstrate L proximal UE strength essentially symmetrical to R and L grip strength within 5# of R    Status  On-going            Plan - 02/22/18 0853    Clinical Impression Statement  Mark [name] noting some increased muscle soreness after machine workout at gym with moderate weights - cautioned pt to start with lighter weights and gradully increase to avoid straining muscles. Progressed postural/scapular/UE strengthening with good tolerance today but pt continues to require cueing for postural awarenss and scapular retraction. Hand exercises focusing on gross flexion, extension & abduction as well as fine motor task to improve dexterity. Pt progressing well towrads goals.    Rehab Potential  Good    PT Frequency  2x / week   1-2x/wk dependent on pt's work  schedule   PT Duration  6 weeks    PT Treatment/Interventions  Patient/family education;Neuromuscular re-education;Therapeutic exercise;Therapeutic activities;ADLs/Self Care Home Management;Manual techniques;Passive range of motion;Dry needling;Taping;Electrical Stimulation;Moist Heat;Ultrasound;Traction;Iontophoresis 4mg /ml Dexamethasone    PT Next Visit Plan  Progress postural strengthening as tolerated; Progress grip strengthening; UE nerve glides as indicated for radiculopathy; Manual therapy including possible DN for increased muscle tension    Consulted and Agree with Plan of Care  Patient       Patient will benefit from skilled therapeutic intervention in order to improve the following deficits and impairments:  Postural dysfunction, Improper body mechanics, Increased muscle spasms, Decreased range of motion, Decreased strength, Impaired sensation, Impaired UE functional use, Decreased activity tolerance, Pain  Visit Diagnosis: Radiculopathy, cervical region  Abnormal posture  Other muscle spasm  Cervicalgia     Problem List Patient Active Problem List   Diagnosis Date Noted  . Neck pain 04/19/2017  . Anxiety state 04/28/2015  . Wellness examination 03/24/2015  . Acute pharyngitis 03/30/2014  . Intercostal muscle tear 06/20/2013  . Nonspecific abnormal electrocardiogram (ECG) (EKG) 03/08/2011  . GERD 11/14/2007  . ANXIETY STATE NOS 12/11/2006  . Hyperglycemia 09/06/2006    Mark [name], PT, MPT 02/22/2018, 11:50 AM  [facility] [address]  [address] [place], [place], [address] Phone: [phone]   Fax:  [phone]  Name: Mark [name] MRN: [id number] Date of Birth: 1960-03-21

## 2018-02-25 ENCOUNTER — Encounter: Payer: 59 | Admitting: Physical Therapy

## 2018-02-27 ENCOUNTER — Encounter: Payer: 59 | Admitting: Physical Therapy

## 2018-03-01 ENCOUNTER — Ambulatory Visit: Payer: 59 | Admitting: Physical Therapy

## 2018-03-01 ENCOUNTER — Ambulatory Visit: Payer: 59 | Admitting: Family Medicine

## 2018-03-01 ENCOUNTER — Encounter: Payer: Self-pay | Admitting: Physical Therapy

## 2018-03-01 ENCOUNTER — Encounter: Payer: Self-pay | Admitting: Family Medicine

## 2018-03-01 VITALS — BP 130/84 | HR 62 | Ht 73.0 in | Wt 170.0 lb

## 2018-03-01 DIAGNOSIS — M5412 Radiculopathy, cervical region: Secondary | ICD-10-CM | POA: Diagnosis not present

## 2018-03-01 DIAGNOSIS — M62838 Other muscle spasm: Secondary | ICD-10-CM

## 2018-03-01 DIAGNOSIS — R293 Abnormal posture: Secondary | ICD-10-CM

## 2018-03-01 DIAGNOSIS — M542 Cervicalgia: Secondary | ICD-10-CM

## 2018-03-01 NOTE — Therapy (Signed)
[facility] [address]  [place], [place], [address] Phone: [phone]   Fax:  [phone]  Physical Therapy Treatment  Patient Details  Name: Mark [name] MRN: [id number] Date of Birth: Sep 17, 1960 Referring Provider (PT): Mark Lemon, MD   Encounter Date: 03/01/2018  PT End of Session - 03/01/18 0803    Visit Number  7    Number of Visits  12    Date for PT Re-Evaluation  03/29/18    Authorization Type  UHC    PT Start Time  0803    PT Stop Time  0846    PT Time Calculation (min)  43 min    Activity Tolerance  Patient tolerated treatment well    Behavior During Therapy  Mark [name] for tasks assessed/performed       Past Medical History:  Diagnosis Date  . Family history of prostate cancer     DRE WNL; PSA never > 0.38  . Fasting hyperglycemia     PMH of  . GERD (gastroesophageal reflux disease)   . Tinnitus    w/o hearing loss    Past Surgical History:  Procedure Laterality Date  . COLONOSCOPY  05/2013   Neg, Dr Mark [name]  . KNEE SURGERY Right   . TONSILLECTOMY AND ADENOIDECTOMY    . VASECTOMY      There were no vitals filed for this visit.  Subjective Assessment - 03/01/18 0805    Subjective  Pt remains pain free and feels like his hand is improving.    Patient Stated Goals  "to get my L hand feeling more normal"    Currently in Pain?  No/denies    Pain Onset  More than a month ago         [facility] PT Assessment - 03/01/18 0803      Assessment   Medical Diagnosis  Cervical radiculopathy    Referring Provider (PT)  Mark Lemon, MD    Onset Date/Surgical Date  --   >1 yr   Hand Dominance  Right    Next MD Visit  03/01/18      AROM   Cervical Flexion  61    Cervical Extension  54    Cervical - Right Side Bend  34    Cervical - Left Side Bend  24    Cervical - Right Rotation  81    Cervical - Left Rotation  77      Strength   Right Shoulder Flexion  5/5    Right Shoulder ABduction  5/5     Right Shoulder Internal Rotation  5/5    Right Shoulder External Rotation  5/5    Left Shoulder Flexion  4+/5    Left Shoulder ABduction  4+/5    Left Shoulder Internal Rotation  5/5    Left Shoulder External Rotation  4+/5    Right Elbow Flexion  5/5    Right Elbow Extension  5/5    Left Elbow Flexion  5/5    Left Elbow Extension  5/5    Right Forearm Pronation  5/5    Right Forearm Supination  5/5    Left Forearm Pronation  5/5    Left Forearm Supination  4+/5    Right Wrist Flexion  5/5    Right Wrist Extension  5/5    Right Wrist Radial Deviation  5/5    Right Wrist Ulnar Deviation  5/5    Left Wrist Flexion  5/5  Left Wrist Extension  5/5    Left Wrist Radial Deviation  5/5    Left Wrist Ulnar Deviation  5/5    Right Hand Grip (lbs)  55   54, 58, 53   Right Hand Lateral Pinch  19 lbs   19, 19, 19   Right Hand 3 Point Pinch  15.67 lbs   16, 16, 15   Left Hand Grip (lbs)  50.67   55, 50, 47   Left Hand Lateral Pinch  15.67 lbs   16, 15, 16   Left Hand 3 Point Pinch  16.67 lbs   17, 16, 17                  [facility] Adult PT Treatment/Exercise - 03/01/18 0803      Exercises   Exercises  Neck      Neck Exercises: Machines for Strengthening   UBE (Upper Arm Bike)  L3.0 x 6 min (3' fwd/3' back)      Hand Exercises for Cervical Radiculopathy   Other Hand Exercise for Cervical Radiculopathy  small pegboard x 5 rows               PT Short Term Goals - 02/14/18 1319      PT SHORT TERM GOAL #1   Title  Independent with initial HEP    Status  Achieved        PT Long Term Goals - 03/01/18 0807      PT LONG TERM GOAL #1   Title  Independent with advanced/ongoing HEP    Status  Partially Met    Target Date  03/29/18      PT LONG TERM GOAL #2   Title  Patient to demonstrate cervical AROM to [facility] in all planes without pain or tightness    Status  Achieved      PT LONG TERM GOAL #3   Title  Patient to demonstrate good postural awareness  and body mechanics as it relates to his daily activities.    Status  Partially Met    Target Date  03/29/18      PT LONG TERM GOAL #4   Title  Patient to demonstrate L proximal UE strength essentially symmetrical to R and L grip strength within 5# of R    Status  Achieved      PT LONG TERM GOAL #5   Title  Patient to report errors with typing with L hand </= 50% of the time    Status  New    Target Date  03/29/18            Plan - 03/01/18 0807    Clinical Impression Statement  Mark [name] reporting 75-80% improvement with PT thus far with improved grip strength but still notes difficulty with typing and fine motor tasks with L hand. Reports errors with typing ~75% of the time with the L hand including repetitive letters w/o realizing that his finger was touching the keys. Mark [name] is now more aware of his posture but admits that he still frequently finds himself having to correct his posture. Cervical ROM significantly improved - now grossly [facility] with only restriction in L side bending. Overall UE strrength has improved with L UE now essentially symmetrical to R and L grip strength within 5# of R. Strength goal now met with remaining goals partially met. Mark [name] wishing to continue PT 1x/wk for 4 more weeks to continue focus on fine motor skills with L hand.  Rehab Potential  Good    PT Frequency  1x / week    PT Duration  4 weeks    PT Treatment/Interventions  Patient/family education;Neuromuscular re-education;Therapeutic exercise;Therapeutic activities;ADLs/Self Care Home Management;Manual techniques;Passive range of motion;Dry needling;Taping;Electrical Stimulation;Moist Heat;Ultrasound;Traction;Iontophoresis '4mg'$ /ml Dexamethasone    PT Next Visit Plan  Progress postural strengthening as tolerated; Fine motor control     Consulted and Agree with Plan of Care  Patient       Patient will benefit from skilled therapeutic intervention in order to improve the following deficits and impairments:   Postural dysfunction, Improper body mechanics, Increased muscle spasms, Decreased range of motion, Decreased strength, Impaired sensation, Impaired UE functional use, Decreased activity tolerance, Pain  Visit Diagnosis: Radiculopathy, cervical region  Abnormal posture  Other muscle spasm  Cervicalgia     Problem List Patient Active Problem List   Diagnosis Date Noted  . Neck pain 04/19/2017  . Anxiety state 04/28/2015  . Wellness examination 03/24/2015  . Acute pharyngitis 03/30/2014  . Intercostal muscle tear 06/20/2013  . Nonspecific abnormal electrocardiogram (ECG) (EKG) 03/08/2011  . GERD 11/14/2007  . ANXIETY STATE NOS 12/11/2006  . Hyperglycemia 09/06/2006    Percival Spanish, PT, MPT 03/01/2018, 8:45 AM  [facility] [address]  [place], [place], [address] Phone: [phone]   Fax:  [phone]  Name: Mark [name] MRN: [id number] Date of Birth: January 15, 1961

## 2018-03-01 NOTE — Progress Notes (Signed)
PCP and consultation requested by: Esperanza Richters, PA-C  Subjective:   HPI: Patient is a 58 y.o. male here for neck pain.  3/5: Patient reports about 3-5 years ago he accidentally landed onto his upper back and neck.  He is continued to have problems with posterior neck pain more at the base of his neck but over the past 8-10 months has had radiation of pain into his left arm all the way to the hand with left hand feeling more weak.  He has an associated numbness and cold feeling as well that is worse at nighttime.  He has a friend that is shown him some physical therapy so has been doing some home exercises.  He has trouble gripping and picking items up.  No problems with the right side.  Pain level is currently at a 2 out of 10 level. He is right-handed.  4/9: Patient reports he's doing well. Pain level 0/10 now. Rarely taking aleve. Having some difficulty picking things up with left hand but no other complaints. Doing well with physical therapy, home exercises. Pain level 0/10. No skin changes, numbness.  12/5: Patient returned today reporting continued problems with weakness in left hand. Difficulty gripping items, feels like he can't hold them. Not bad enough that he's dropping things. Also has a tremor of left hand at rest - not noted elsewhere. Does not feel his gait, speech has changed. Thinks tremor has been present a couple years. Pain level 0/10 in neck but can rarely get up to 5/10 level, sharp. No skin changes. No numbness.  03/01/18: Patient reports he's doing very well. Typing with left hand seems to be his biggest struggle. No pain now of the neck and motion is good. Due to see neurology for his tremor first week of February. Not taking any medicines for his neck. No numbness.  Past Medical History:  Diagnosis Date  . Family history of prostate cancer     DRE WNL; PSA never > 0.38  . Fasting hyperglycemia     PMH of  . GERD (gastroesophageal reflux  disease)   . Tinnitus    w/o hearing loss    Current Outpatient Medications on File Prior to Visit  Medication Sig Dispense Refill  . azithromycin (ZITHROMAX) 250 MG tablet Take 2 tablets by mouth on day 1, followed by 1 tablet by mouth daily for 4 days. 6 tablet 0  . benzonatate (TESSALON) 100 MG capsule Take 1 capsule (100 mg total) by mouth 3 (three) times daily as needed for cough. 30 capsule 0  . fluticasone (FLONASE) 50 MCG/ACT nasal spray Place 2 sprays into both nostrils daily. 16 g 1  . LORazepam (ATIVAN) 0.5 MG tablet TAKE ONE TABLET BY MOUTH TWICE A DAY AS NEEDED FOR ANXIETY 45 tablet 0  . Multiple Vitamins-Minerals (MENS MULTIVITAMIN PLUS) TABS Take by mouth daily.    . sildenafil (VIAGRA) 100 MG tablet Take 1 tablet (100 mg total) by mouth daily as needed for erectile dysfunction. 10 tablet 0  . [DISCONTINUED] venlafaxine XR (EFFEXOR-XR) 37.5 MG 24 hr capsule Take 1 capsule (37.5 mg total) by mouth daily with breakfast. 30 capsule 0   No current facility-administered medications on file prior to visit.     Past Surgical History:  Procedure Laterality Date  . COLONOSCOPY  05/2013   Neg, Dr Marina Goodell  . KNEE SURGERY Right   . TONSILLECTOMY AND ADENOIDECTOMY    . VASECTOMY      Allergies  Allergen Reactions  .  Penicillins     ? Reaction @ age 8    Social History   Socioeconomic History  . Marital status: Married    Spouse name: Not on file  . Number of children: Not on file  . Years of education: Not on file  . Highest education level: Not on file  Occupational History  . Not on file  Social Needs  . Financial resource strain: Not on file  . Food insecurity:    Worry: Not on file    Inability: Not on file  . Transportation needs:    Medical: Not on file    Non-medical: Not on file  Tobacco Use  . Smoking status: Never Smoker  . Smokeless tobacco: Never Used  Substance and Sexual Activity  . Alcohol use: Yes    Comment: very rare wine , < one per month per  pt.  . Drug use: No  . Sexual activity: Yes  Lifestyle  . Physical activity:    Days per week: Not on file    Minutes per session: Not on file  . Stress: Not on file  Relationships  . Social connections:    Talks on phone: Not on file    Gets together: Not on file    Attends religious service: Not on file    Active member of club or organization: Not on file    Attends meetings of clubs or organizations: Not on file    Relationship status: Not on file  . Intimate partner violence:    Fear of current or ex partner: Not on file    Emotionally abused: Not on file    Physically abused: Not on file    Forced sexual activity: Not on file  Other Topics Concern  . Not on file  Social History Narrative  . Not on file    Family History  Problem Relation Age of Onset  . COPD Mother        smoker  . Prostate cancer Father 90       no longer with prostate cancer  . Breast cancer Sister   . COPD Paternal Aunt        smoker  . Stroke Maternal Grandfather         late 50s  . Heart disease Neg Hx   . Colon cancer Neg Hx   . Esophageal cancer Neg Hx   . Pancreatic cancer Neg Hx   . Rectal cancer Neg Hx   . Stomach cancer Neg Hx   . Diabetes Neg Hx     BP 130/84   Pulse 62   Ht 6\' 1"  (1.854 m)   Wt 170 lb (77.1 kg)   BMI 22.43 kg/m   Review of Systems: See HPI above.     Objective:  Physical Exam:  Gen: NAD, comfortable in exam room  Neck: No gross deformity, swelling, bruising. No TTP .  No midline/bony TTP. FROM without pain. BUE strength 5/5.   Sensation intact to light touch.   2+ equal reflexes in triceps, biceps, brachioradialis tendons. NV intact distal BUEs.   Assessment & Plan:  1. Neck pain - doing very well with physical therapy - will continue with this and transition to home exercise program when agreeable with PT.  Aleve, tylenol only if needed.  F/u prn if doing well.

## 2018-03-01 NOTE — Patient Instructions (Addendum)
You're doing great! Continue with therapy as you transition to just the home exercise program. i'd do the home exercises 3-4 times a week for another 6 weeks beyond physical therapy. Follow up with me as needed though if you're doing well.

## 2018-03-08 ENCOUNTER — Encounter: Payer: Self-pay | Admitting: Physical Therapy

## 2018-03-08 ENCOUNTER — Ambulatory Visit: Payer: 59 | Admitting: Physical Therapy

## 2018-03-08 DIAGNOSIS — M62838 Other muscle spasm: Secondary | ICD-10-CM

## 2018-03-08 DIAGNOSIS — M542 Cervicalgia: Secondary | ICD-10-CM

## 2018-03-08 DIAGNOSIS — M5412 Radiculopathy, cervical region: Secondary | ICD-10-CM | POA: Diagnosis not present

## 2018-03-08 DIAGNOSIS — R293 Abnormal posture: Secondary | ICD-10-CM

## 2018-03-08 NOTE — Therapy (Addendum)
[facility] [address]  [place], [place], [address] Phone: [phone]   Fax:  [phone]  Physical Therapy Treatment / Discharge Summary  Patient Details  Name: Mark [name] MRN: [id number] Date of Birth: September 07, 1960 Referring Provider (PT): Karlton Lemon, MD   Encounter Date: 03/08/2018  PT End of Session - 03/08/18 0804    Visit Number  8    Number of Visits  12    Date for PT Re-Evaluation  03/29/18    Authorization Type  [employer]    PT Start Time  0804    PT Stop Time  0846    PT Time Calculation (min)  42 min    Activity Tolerance  Patient tolerated treatment well    Behavior During Therapy  [facility] for tasks assessed/performed       Past Medical History:  Diagnosis Date  . Family history of prostate cancer     DRE WNL; PSA never > 0.38  . Fasting hyperglycemia     PMH of  . GERD (gastroesophageal reflux disease)   . Tinnitus    w/o hearing loss    Past Surgical History:  Procedure Laterality Date  . COLONOSCOPY  05/2013   Neg, Dr Henrene Pastor  . KNEE SURGERY Right   . TONSILLECTOMY AND ADENOIDECTOMY    . VASECTOMY      There were no vitals filed for this visit.  Subjective Assessment - 03/08/18 0805    Subjective  "Doing pretty good - no pain."    Patient Stated Goals  "to get my L hand feeling more normal"    Currently in Pain?  No/denies    Pain Onset  More than a month ago                       [facility] Adult PT Treatment/Exercise - 03/08/18 0804      Exercises   Exercises  Neck;Hand      Neck Exercises: Machines for Strengthening   UBE (Upper Arm Bike)  L3.0 x 6 min (3' fwd/3' back)      Hand Exercises   MCPJ Extension  Left;10 reps    MCPJ Extension Limitations  isolated & composite    Digit Composite ABduction  Left;10 reps;Strengthening    Digit Composite ABduction Limitations  white rubberband    Opposition  Left;10 reps    Opposition Limitations  sequential fingers to  thumb - focus on rounding of fingers    Tendon Glides  Hand x 10, finger extension    Rubberbands  Isolated finger extension with black rubberband x 10 each    Other Hand Exercises  Wrist extension stretch 2 x 30 sec    Other Hand Exercises  MCP extension stre             PT Education - 03/08/18 0845    Education Details  HEP update - hand/finger exercises    Person(s) Educated  Patient    Methods  Explanation;Demonstration;Handout    Comprehension  Verbalized understanding;Returned demonstration;Need further instruction       PT Short Term Goals - 02/14/18 1319      PT SHORT TERM GOAL #1   Title  Independent with initial HEP    Status  Achieved        PT Long Term Goals - 03/08/18 0846      PT LONG TERM GOAL #1   Title  Independent with advanced/ongoing HEP  Status  Partially Met    Target Date  03/29/18      PT LONG TERM GOAL #2   Title  Patient to demonstrate cervical AROM to [facility] in all planes without pain or tightness    Status  Achieved      PT LONG TERM GOAL #3   Title  Patient to demonstrate good postural awareness and body mechanics as it relates to his daily activities.    Status  Partially Met    Target Date  03/29/18      PT LONG TERM GOAL #4   Title  Patient to demonstrate L proximal UE strength essentially symmetrical to R and L grip strength within 5# of R    Status  Achieved      PT LONG TERM GOAL #5   Title  Patient to report errors with typing with L hand </= 50% of the time    Status  On-going    Target Date  03/29/18            Plan - 03/08/18 0807    Clinical Impression Statement  Treatment focusing on tendon glides and composite as well as isolated finger motions with emphasis on extension & abduction. Pt demonstrating tendency to hyperextend at PIP joints and report feeling like he has to "unlock" the PIP to finish desired motions.    Rehab Potential  Good    PT Frequency  1x / week    PT Duration  4 weeks    PT  Treatment/Interventions  Patient/family education;Neuromuscular re-education;Therapeutic exercise;Therapeutic activities;ADLs/Self Care Home Management;Manual techniques;Passive range of motion;Dry needling;Taping;Electrical Stimulation;Moist Heat;Ultrasound;Traction;Iontophoresis '4mg'$ /ml Dexamethasone    PT Next Visit Plan  Progress postural strengthening as tolerated; Hand fine motor control     Consulted and Agree with Plan of Care  Patient       Patient will benefit from skilled therapeutic intervention in order to improve the following deficits and impairments:  Postural dysfunction, Improper body mechanics, Increased muscle spasms, Decreased range of motion, Decreased strength, Impaired sensation, Impaired UE functional use, Decreased activity tolerance, Pain  Visit Diagnosis: Radiculopathy, cervical region  Abnormal posture  Other muscle spasm  Cervicalgia     Problem List Patient Active Problem List   Diagnosis Date Noted  . Neck pain 04/19/2017  . Anxiety state 04/28/2015  . Wellness examination 03/24/2015  . Acute pharyngitis 03/30/2014  . Intercostal muscle tear 06/20/2013  . Nonspecific abnormal electrocardiogram (ECG) (EKG) 03/08/2011  . GERD 11/14/2007  . ANXIETY STATE NOS 12/11/2006  . Hyperglycemia 09/06/2006    Percival Spanish, PT, MPT 03/08/2018, 11:44 AM  [facility] [address]  [place], [place], [address] Phone: [phone]   Fax:  [phone]  Name: Mark [name] MRN: [id number] Date of Birth: [birth year]   PHYSICAL THERAPY DISCHARGE SUMMARY  Visits from Start of Care: 8  Current functional level related to goals / functional outcomes:   Refer to above clinical impression for status as of last visit on 03/08/18. As of 03/14/18, patient called to say he would to continue on his own with his HEP, therefore he was placed on hold for 30 days and has not needed to return to PT.  Will  proceed with discharge from PT for this episode. Unable to formally assess status at discharge due to failure to return for remaining scheduled visits.    Remaining deficits:   As above.   Education / Equipment:   HEP  Plan:  Patient agrees to discharge.  Patient goals were partially met. Patient is being discharged due to the patient's request.  ?????     Percival Spanish, PT, MPT 04/23/18, 8:30 AM  [facility] [address]  [place], [place], [address] Phone: [phone]   Fax:  [phone]

## 2018-03-15 ENCOUNTER — Ambulatory Visit: Payer: 59

## 2018-03-15 ENCOUNTER — Other Ambulatory Visit: Payer: Self-pay | Admitting: Medical

## 2018-03-18 NOTE — Telephone Encounter (Signed)
Refill Request: Lorazepam  Last RX:02/17/18 Last OV:10/30/17 Next OV:03/21/18 UDS:10/22/17 CSC:03/06/17 CSR:

## 2018-03-18 NOTE — Telephone Encounter (Signed)
Rx ativan sent to pt pharmacy.  

## 2018-03-21 ENCOUNTER — Encounter: Payer: Self-pay | Admitting: Medical

## 2018-03-21 ENCOUNTER — Ambulatory Visit: Payer: 59 | Admitting: Medical

## 2018-03-21 VITALS — BP 104/76 | HR 86 | Resp 12 | Ht 73.0 in | Wt 177.0 lb

## 2018-03-21 DIAGNOSIS — F419 Anxiety disorder, unspecified: Secondary | ICD-10-CM | POA: Diagnosis not present

## 2018-03-21 DIAGNOSIS — R6882 Decreased libido: Secondary | ICD-10-CM

## 2018-03-21 DIAGNOSIS — N529 Male erectile dysfunction, unspecified: Secondary | ICD-10-CM

## 2018-03-21 MED ORDER — LORAZEPAM 0.5 MG PO TABS: ORAL_TABLET | ORAL | 0 refills | Status: DC

## 2018-03-21 MED ORDER — SILDENAFIL CITRATE 100 MG PO TABS: 100.0000 mg | ORAL_TABLET | Freq: Every day | ORAL | 0 refills | Status: DC | PRN

## 2018-03-21 NOTE — Patient Instructions (Addendum)
For your anxiety will continue ativan. Give me 2-3 days notice before you run out. Keep up conservative measures such as exercise and going to church to help with anxiety.   For ED will rx viagra. Use no more than one within any 24 hour period.  Follow up 4-5 months as needed.

## 2018-03-21 NOTE — Progress Notes (Signed)
Subjective:    Patient ID: Mark [name], male    DOB: [birth year], 58 y.o.   MRN: [id number]  HPI Follow up for anxiety. No problems reported. Here for controlled med visit. Doing well with 45 tabs of ativan per month. Last entire month. He called in few days early to give me lead time.  Pt also has erectile dysfunction. He used viagra in the past. It has worked in past.   Review of Systems  Constitutional: Negative for chills, fatigue and fever.  Respiratory: Negative for cough, chest tightness, shortness of breath and wheezing.   Cardiovascular: Negative for chest pain and palpitations.  Gastrointestinal: Negative for abdominal pain.  Genitourinary: Negative for decreased urine volume, enuresis and frequency.       Erectile dysfunction  Musculoskeletal: Negative for back pain.  Skin: Negative for rash.  Neurological: Negative for dizziness, speech difficulty, weakness, numbness and headaches.  Hematological: Negative for adenopathy. Does not bruise/bleed easily.  Psychiatric/Behavioral: Negative for behavioral problems and decreased concentration.       Anxiety controlled.    Past Medical History:  Diagnosis Date  . Family history of prostate cancer     DRE WNL; PSA never > 0.38  . Fasting hyperglycemia     PMH of  . GERD (gastroesophageal reflux disease)   . Tinnitus    w/o hearing loss     Social History   Socioeconomic History  . Marital status: Married    Spouse name: Not on file  . Number of children: Not on file  . Years of education: Not on file  . Highest education level: Not on file  Occupational History  . Not on file  Social Needs  . Financial resource strain: Not on file  . Food insecurity:    Worry: Not on file    Inability: Not on file  . Transportation needs:    Medical: Not on file    Non-medical: Not on file  Tobacco Use  . Smoking status: Never Smoker  . Smokeless tobacco: Never Used  Substance and Sexual Activity  . Alcohol use: Yes      Comment: very rare wine , < one per month per pt.  . Drug use: No  . Sexual activity: Yes  Lifestyle  . Physical activity:    Days per week: Not on file    Minutes per session: Not on file  . Stress: Not on file  Relationships  . Social connections:    Talks on phone: Not on file    Gets together: Not on file    Attends religious service: Not on file    Active member of club or organization: Not on file    Attends meetings of clubs or organizations: Not on file    Relationship status: Not on file  . Intimate partner violence:    Fear of current or ex partner: Not on file    Emotionally abused: Not on file    Physically abused: Not on file    Forced sexual activity: Not on file  Other Topics Concern  . Not on file  Social History Narrative  . Not on file    Past Surgical History:  Procedure Laterality Date  . COLONOSCOPY  05/2013   Neg, Dr Marina Goodell  . KNEE SURGERY Right   . TONSILLECTOMY AND ADENOIDECTOMY    . VASECTOMY      Family History  Problem Relation Age of Onset  . COPD Mother  smoker  . Prostate cancer Father [id number]       no longer with prostate cancer  . Breast cancer Sister   . COPD Paternal Aunt        smoker  . Stroke Maternal Grandfather         late 4360s  . Heart disease Neg Hx   . Colon cancer Neg Hx   . Esophageal cancer Neg Hx   . Pancreatic cancer Neg Hx   . Rectal cancer Neg Hx   . Stomach cancer Neg Hx   . Diabetes Neg Hx     Allergies  Allergen Reactions  . Penicillins     ? Reaction @ age 37    Current Outpatient Medications on File Prior to Visit  Medication Sig Dispense Refill  . fluticasone (FLONASE) 50 MCG/ACT nasal spray Place 2 sprays into both nostrils daily. 16 g 1  . LORazepam (ATIVAN) 0.5 MG tablet TAKE ONE TABLET BY MOUTH TWICE A DAY AS NEEDED FOR ANXIETY 45 tablet 0  . Multiple Vitamins-Minerals (MENS MULTIVITAMIN PLUS) TABS Take by mouth daily.    . sildenafil (VIAGRA) 100 MG tablet Take 1 tablet (100 mg total) by  mouth daily as needed for erectile dysfunction. (Patient not taking: Reported on 03/21/2018) 10 tablet 0  . [DISCONTINUED] venlafaxine XR (EFFEXOR-XR) 37.5 MG 24 hr capsule Take 1 capsule (37.5 mg total) by mouth daily with breakfast. 30 capsule 0   No current facility-administered medications on file prior to visit.     BP 104/76   Pulse 86   Resp 12   Ht 6\' 1"  (1.854 m)   Wt 177 lb (80.3 kg)   SpO2 99%   BMI 23.35 kg/m       Objective:   Physical Exam  General Mental Status- Alert. General Appearance- Not in acute distress.    Chest and Lung Exam Auscultation: Breath Sounds:-Normal.  Cardiovascular Auscultation:Rythm- Regular. Murmurs & Other Heart Sounds:Auscultation of the heart reveals- No Murmurs.   Neurologic Cranial Nerve exam:- CN III-XII intact(No nystagmus), symmetric smile. Normal/Intact Strength:- 5/5 equal and symmetric strength both upper and lower extremities.      Assessment & Plan:  For your anxiety will continue ativan. Give me 2-3 days notice before you run out. Keep up conservative measures such as exercise and going to church to help with anxiety.   For ED will rx viagra. Use no more than one within any 24 hour period.  Follow up 4-5 months  as needed.  Esperanza RichtersEdward Arne Schlender, PA-C

## 2018-04-03 ENCOUNTER — Telehealth: Payer: Self-pay

## 2018-04-03 ENCOUNTER — Encounter: Payer: Self-pay | Admitting: Neurology

## 2018-04-03 ENCOUNTER — Ambulatory Visit: Payer: 59 | Admitting: Neurology

## 2018-04-03 VITALS — BP 128/83 | HR 83 | Ht 73.0 in | Wt 173.0 lb

## 2018-04-03 DIAGNOSIS — G20 Parkinson's disease: Secondary | ICD-10-CM | POA: Diagnosis not present

## 2018-04-03 NOTE — Telephone Encounter (Signed)
DAT scan consent form on Dana's desk. Please process as per protocol.

## 2018-04-03 NOTE — Progress Notes (Signed)
Subjective:    Patient ID: Mark MansJeffrey Manders is a 58 y.o. male.  HPI     Mark FoleySaima Eriberto Felch, MD, PhD [facility] [address], [address] [address] [place], [place] [id number]  Dear Dr. Pearletha ForgeHudnall,   I saw your patient, Mark [name], upon your kind request in my neurologic clinic today for initial consultation of his tremor. The patient is unaccompanied today. Of note, he missed an appointment on 01/30/2018. As you know, Mark [name] is a 58 year old right-handed gentleman with an underlying medical history of neck pain, reflux disease, chronic tinnitus, anxiety, and allergic rhinitis, who reports a left hand tremor for the past several months, likely 6-9 months. In addition, he has noted difficulty with fine motor control, he has noticed neck stiffness and left hand stiffness and difficulty performing fine motor skills with the left hand. I reviewed your office note from 01/17/2018. He has had radiating neck pain to the left arm. He has had physical therapy for this. His neck pain has improved.  He fell in 2017 and had neck pain. He had a X ray neck on 03/26/17: IMPRESSION: No acute findings.   Mild spondylosis of the cervical spine with moderate to severe multilevel bilateral neural foraminal narrowing as described.  He denies a family history of Parkinson's disease or tremor. He denies any recent falls. He denies any constipation. Mood is fairly stable but he does admit to having a longer standing history of anxiety. He takes Ativan as needed for this. He also reports claustrophobia and having had difficulty going through an MRI in the past.   He has had some issues with becoming slower in his mobility. His brother recently commented on this. He is the youngest of 4 siblings, he has an older brother, Maurine MinisterDennis sister, then another brother. He lives with his family which includes his wife and 56 year old daughter and 56 year old son. He works as a Insurance claims handlershowroom manager.he is a nonsmoker and  drinks alcohol in the form of wine, maybe 3 glasses per week on average, caffeine in the form of coffee, 3 cups per day on average. He tries to hydrate well. He exercises regularly.  His Past Medical History Is Significant For: Past Medical History:  Diagnosis Date  . Family history of prostate cancer     DRE WNL; PSA never > 0.38  . Fasting hyperglycemia     PMH of  . GERD (gastroesophageal reflux disease)   . Tinnitus    w/o hearing loss    His Past Surgical History Is Significant For: Past Surgical History:  Procedure Laterality Date  . COLONOSCOPY  05/2013   Neg, Dr [name]  . KNEE SURGERY Right   . TONSILLECTOMY AND ADENOIDECTOMY    . VASECTOMY      His Family History Is Significant For: Family History  Problem Relation Age of Onset  . COPD Mother        smoker  . Prostate cancer Father [id number]       no longer with prostate cancer  . Breast cancer Sister   . COPD Paternal Aunt        smoker  . Stroke Maternal Grandfather         late 5060s  . Heart disease Neg Hx   . Colon cancer Neg Hx   . Esophageal cancer Neg Hx   . Pancreatic cancer Neg Hx   . Rectal cancer Neg Hx   . Stomach cancer Neg Hx   . Diabetes Neg Hx  His Social History Is Significant For: Social History   Socioeconomic History  . Marital status: Married    Spouse name: Not on file  . Number of children: Not on file  . Years of education: Not on file  . Highest education level: Not on file  Occupational History  . Not on file  Social Needs  . Financial resource strain: Not on file  . Food insecurity:    Worry: Not on file    Inability: Not on file  . Transportation needs:    Medical: Not on file    Non-medical: Not on file  Tobacco Use  . Smoking status: Never Smoker  . Smokeless tobacco: Never Used  Substance and Sexual Activity  . Alcohol use: Yes    Comment: very rare wine , < one per month per pt.  . Drug use: No  . Sexual activity: Yes  Lifestyle  . Physical activity:    Days  per week: Not on file    Minutes per session: Not on file  . Stress: Not on file  Relationships  . Social connections:    Talks on phone: Not on file    Gets together: Not on file    Attends religious service: Not on file    Active member of club or organization: Not on file    Attends meetings of clubs or organizations: Not on file    Relationship status: Not on file  Other Topics Concern  . Not on file  Social History Narrative  . Not on file    His Allergies Are:  Allergies  Allergen Reactions  . Penicillins     ? Reaction @ age 89  :   His Current Medications Are:  Outpatient Encounter Medications as of 04/03/2018  Medication Sig  . LORazepam (ATIVAN) 0.5 MG tablet Take 0.5 mg by mouth 2 (two) times daily as needed for anxiety.  . Multiple Vitamins-Minerals (MENS MULTIVITAMIN PLUS) TABS Take by mouth daily.  . sildenafil (VIAGRA) 100 MG tablet Take 1 tablet (100 mg total) by mouth daily as needed for erectile dysfunction.  . [DISCONTINUED] fluticasone (FLONASE) 50 MCG/ACT nasal spray Place 2 sprays into both nostrils daily.  . [DISCONTINUED] venlafaxine XR (EFFEXOR-XR) 37.5 MG 24 hr capsule Take 1 capsule (37.5 mg total) by mouth daily with breakfast.   No facility-administered encounter medications on file as of 04/03/2018.   : Review of Systems:  Out of a complete 14 point review of systems, all are reviewed and negative with the exception of these symptoms as listed below:  Review of Systems  Neurological:       Pt presents today to discuss his tremor in his left hand. He has noticed the tremors for a couple months now. Pt is right handed.    Objective:  Neurological Exam  Physical Exam Physical Examination:   Vitals:   04/03/18 1439  BP: 128/83  Pulse: 83   General Examination: The patient is a very pleasant 58 y.o. male in no acute distress. He appears well-developed and well-nourished and well groomed. He is mildly anxious appearing.  HEENT:  Normocephalic, atraumatic, pupils are equal, round and reactive to light and accommodation. He has mild facial masking and mildly decreased eye blink rate. Hearing is grossly intact. Face is otherwise symmetric, no dysarthria, minimal hypophonia potentially. He has moderate nuchal rigidity. No carotid bruits. Airway examination reveals mild to moderate mouth dryness, otherwise tongue protrudes centrally and palate elevates symmetrically, and no lip, neck or jaw  tremor.  Chest: Clear to auscultation without wheezing, rhonchi or crackles noted.  Heart: S1+S2+0, regular and normal without murmurs, rubs or gallops noted.   Abdomen: Soft, non-tender and non-distended with normal bowel sounds appreciated on auscultation.  Extremities: There is no pitting edema in the distal lower extremities bilaterally.   Skin: Warm and dry without trophic changes noted.  Musculoskeletal: exam reveals no obvious joint deformities, tenderness or joint swelling or erythema.   Neurologically:  Mental status: The patient is awake, alert and oriented in all 4 spheres. His immediate and remote memory, attention, language skills and fund of knowledge are appropriate. There is no evidence of aphasia, agnosia, apraxia or anomia. Speech is clear with normal prosody and enunciation. Thought process is linear. Mood is normal and affect is normal.   On 04/03/2018: on Archimedes spiral drawing he has no significant trembling with either hand, handwriting with the right hand is legible, not tremulous, slightly micrographic. He has no obvious resting tremor, no significant postural or action tremor. Cranial nerves II - XII are as described above under HEENT exam. In addition: shoulder shrug is normal with equal shoulder height noted. At baseline, mildly elevated right shoulder noted. Motor exam: Normal bulk, and strength, tone is increased in the left upper extremity with mild cogwheeling noted, slight increase in tone in the right  upper extremity noted. Otherwise tone is normal. He has no obvious resting tremor. Fine motor skills are moderately impaired with finger taps and hand movements of the left upper extremity, otherwise benign in the upper extremities, foot taps are mildly impaired minimally on the left and normal on the right. Cerebellar testing: No dysmetria or intention tremor on finger to nose testing. There is no truncal or gait ataxia.  Sensory exam: intact to light touch in the upper and lower extremities.  Gait, station and balance: He stands easily. No veering to one side is noted. No leaning to one side is noted. Posture is Fairly age-appropriate. Stance is narrow based. Gait shows fairly good pace and stride length but near absence of arm swing on the left. Balance appears preserved.  Assessment and plan:   In summary, Abdu Barnosky is a very pleasant 58 y.o.-year old male with an underlying medical history of neck pain, reflux disease, chronic tinnitus, anxiety, and allergic rhinitis, who presents for evaluation of a left hand tremor. While he does not have an obvious resting tremor today, he does have some signs of parkinsonism, particularly with left-sided lateralization and concerning for underlying akinetic-rigid type Parkinson's disease. I explained to him that a definitive diagnosis is not always possible or easy when the patient gets evaluated the first time. We talked at length today. I explained the diagnosis of parkinsonism and Parkinson's disease, the prognosis and treatment options. We talked about potentially utilizing symptomatic treatment in the near future. We talked about maintaining a healthy lifestyle in general. I encouraged the patient to eat healthy, exercise daily and keep well hydrated, to keep a scheduled bedtime and wake time routine, to not skip any meals and eat healthy snacks in between meals and to have protein with every meal. In particular, I stressed the importance of regular  exercise, within of course the patient's own mobility limitations.   As far as further diagnostic testing is concerned, I suggested: I would like to proceed with a nuclear medicine DaT scan, especially since he is younger and does not have a telltale parkinsonian tremor. I would like to utilize this scan as an additional  diagnostic tool. He is advised that this is not a definitive/diagnostic test.  As far as medications are concerned, I recommended the following at this time: no change for now. I would like to see him back in about 3-4 months, sooner if needed. We will keep them posted in the interim about his scan results.  I answered all his questions today and the patient was in agreement with the above outlined plan.   Thank you very much for allowing me to participate in the care of this nice patient. If I can be of any further assistance to you please do not hesitate to call me at [phone].  Sincerely,   Mark Foley, MD, PhD

## 2018-04-03 NOTE — Patient Instructions (Signed)
I think you have signs and symptoms of mild parkinsonism, possibly left sided Parkinson's disease.   Please continue with a healthy lifestyle.   I do want to suggest a few things today:  Please be really proactive about constipation, in case It becomes an issue.   Remember to drink plenty of fluid at least 6 glasses (8 oz each), eat healthy meals and do not skip any meals. Try to eat protein with a every meal and eat a healthy snack such as fruit or nuts in between meals. Try to keep a regular sleep-wake schedule and try to exercise daily, particularly in the form of walking, 20-30 minutes a day, if you can.   Try to stay active physically and mentally. Engage in social activities in your community and with your family and try to keep up with current events by reading the newspaper or watching the news. Try to do word puzzles and you may like to do puzzles and brain games on the computer such as on [email or web address]/.   As far as diagnostic testing, I will order: DaT scan: This is a specialized brain scan designed to help with diagnosis of tremor disorders. A radioactive marker gets injected and the uptake is measured in the brain and compared to normal controls and right side is compared to the left, a change in uptake can help with diagnosis of certain tremor disorders. A brain MRI on the other hand is a brain scan that helps look at the brain structure in more detail overall and look for age-related changes, blood vessel related changes and look for stroke and volume loss which we call atrophy.   I would like to see you back in 3 months, sooner if we need to. Please call us with any interim questions, concerns, problems, updates or refill requests.  Our phone number is [phone]. We also have an after hours call service for urgent matters and there is a physician on-call for urgent questions, that cannot wait till the next work day. For any emergencies you know to call 911 or go to the nearest  emergency room.

## 2018-04-04 ENCOUNTER — Telehealth: Payer: Self-pay | Admitting: Neurology

## 2018-04-04 NOTE — Telephone Encounter (Signed)
[employer] pending faxed clinical notes to [facility]

## 2018-04-04 NOTE — Telephone Encounter (Signed)
Noted, waiting on Dr. Frances Furbish signature and I will fax everything.

## 2018-04-12 ENCOUNTER — Other Ambulatory Visit: Payer: Self-pay | Admitting: Medical

## 2018-04-16 ENCOUNTER — Telehealth: Payer: Self-pay | Admitting: Medical

## 2018-04-16 ENCOUNTER — Encounter: Payer: Self-pay | Admitting: Medical

## 2018-04-16 NOTE — Telephone Encounter (Signed)
Refilled pt ativan. Called to ask about narx web site. Why no meds when I look at in epic??

## 2018-04-16 NOTE — Telephone Encounter (Signed)
Will you call pt pharmacy and see when he got last fill and for how many tabs?

## 2018-04-16 NOTE — Telephone Encounter (Signed)
Opened to review 

## 2018-04-16 NOTE — Telephone Encounter (Signed)
DAT  Scan Was approved  [id number] 02/19-04/05/2018 . I will sent to [name] to Schedule. Thanks Irving Burton for your help. Annabelle Harman

## 2018-04-16 NOTE — Telephone Encounter (Signed)
Refill Request: Lorazepam  Last RX:03/21/18 Last OV:04/03/18 Next OV: None scheduled  UDS:03/06/17 CSC:11/01/17 CSR:

## 2018-04-16 NOTE — Telephone Encounter (Signed)
I did call pt pharmacy. Controlled med profile under [name]. Last rx ativan filled on Mar 18, 2018.

## 2018-04-17 NOTE — Telephone Encounter (Signed)
Pt filled last 03/18/18  For 45 tablets

## 2018-04-18 ENCOUNTER — Encounter: Payer: Self-pay | Admitting: Neurology

## 2018-04-18 NOTE — Telephone Encounter (Signed)
Patient is scheduled for 05/09/18 at [facility].

## 2018-05-09 ENCOUNTER — Encounter: Payer: Self-pay | Admitting: Neurology

## 2018-05-09 ENCOUNTER — Ambulatory Visit (HOSPITAL_COMMUNITY): Payer: 59

## 2018-05-09 ENCOUNTER — Other Ambulatory Visit (HOSPITAL_COMMUNITY): Payer: 59

## 2018-05-13 ENCOUNTER — Other Ambulatory Visit: Payer: Self-pay | Admitting: Medical

## 2018-05-13 ENCOUNTER — Encounter: Payer: Self-pay | Admitting: Medical

## 2018-05-13 NOTE — Telephone Encounter (Signed)
Refill Request: Lorazepam   Last RX:04/16/18 Last OV:03/21/18 Next OV: None scheduled  UDS:03/06/17 CSC:10/22/17 CSR:

## 2018-05-27 ENCOUNTER — Encounter: Payer: Self-pay | Admitting: Medical

## 2018-05-27 NOTE — Telephone Encounter (Signed)
Patient returned please advise patient directly at    Mobile: [phone] (Preferred)

## 2018-05-28 ENCOUNTER — Ambulatory Visit (INDEPENDENT_AMBULATORY_CARE_PROVIDER_SITE_OTHER): Payer: 59 | Admitting: Medical

## 2018-05-28 ENCOUNTER — Other Ambulatory Visit: Payer: Self-pay

## 2018-05-28 ENCOUNTER — Encounter: Payer: Self-pay | Admitting: Medical

## 2018-05-28 DIAGNOSIS — F419 Anxiety disorder, unspecified: Secondary | ICD-10-CM

## 2018-05-28 NOTE — Progress Notes (Signed)
° °  Subjective:    Patient ID: Mark [name], male    DOB: 01-Aug-1960, 58 y.o.   MRN: [id number]  HPI   Virtual Visit via Video Note  I connected with Megan Mans on 05/28/18 at 10:40 AM EDT by a video enabled telemedicine application and verified that I am speaking with the correct person using two identifiers.   I discussed the limitations of evaluation and management by telemedicine and the availability of in person appointments. The patient expressed understanding and agreed to proceed.  History of Present Illness:  Pt has hx of anxiety for years since 2010. He was on ativan for about 10 years. Other medications caused side effects.   Pt has upcoming scan. He has some slurred speech and slow movements. Some slight hand tremors. Will have dat scan for next week.  Pt states he has no cognitive effects with use of benzodiazepene in past that he can note. In fact all of coworkers were layed off and he is only one left doing other persons duties.     Observations/Objective: Mild flat affect. Mild slow speech but has been same pattern since I have saw him years ago.  Assessment and Plan: Patient has history of anxiety treated with benzodiazepines in the past.  Also recent diagnosis of probable Parkinson's.  He has scan to be done in about a week to give more definitive probable diagnosis of.  In the past he had tried sertraline for anxiety but this caused rectal dysfunction.  Also he reports that his former PCP about 10 years ago tried various medications but all cause some adverse side effects which he cannot tolerate.  Recently with a lot of his coworkers getting laid off and his workload increased his anxiety has worsened.  He is using Ativan a little bit more frequently than he used to formally.  In the past he was just using 1 to 2 tablets a day and that would typically last him for 4 months but I was only given him 45 tablets.  In light of recent circumstances we will increase his  Ativan to 60 tablets a month on his next refill.  Due to his probable Parkinson's and history of adverse side effects to other medications for anxiety, I do plan to most likely refer him to psychiatry to get opinion on other potential medication options other than the benzodiazepines.  Will make this decision after he gets results of his scan.  Follow-up date to be determined.  Esperanza Richters, PA-C Follow Up Instructions:    I discussed the assessment and treatment plan with the patient. The patient was provided an opportunity to ask questions and all were answered. The patient agreed with the plan and demonstrated an understanding of the instructions.   The patient was advised to call back or seek an in-person evaluation if the symptoms worsen or if the condition fails to improve as anticipated.  I provided 25 minutes of non-face-to-face time during this encounter.   Esperanza Richters, PA-C    Review of Systems  Cardiovascular: Negative for chest pain and palpitations.  Psychiatric/Behavioral: Negative for agitation, decreased concentration, dysphoric mood and sleep disturbance. The patient is nervous/anxious. The patient is not hyperactive.        Objective:   Physical Exam See objective.       Assessment & Plan:

## 2018-05-28 NOTE — Telephone Encounter (Signed)
LMOM informing Pt to call and schedule virtual visit at his convenience.

## 2018-05-28 NOTE — Telephone Encounter (Signed)
Virtual visit scheduled.  

## 2018-05-28 NOTE — Patient Instructions (Signed)
Patient has history of anxiety treated with benzodiazepines in the past.  Also recent diagnosis of probable Parkinson's.  He has scan to be done in about a week to give more definitive probable diagnosis of.  In the past he had tried sertraline for anxiety but this caused rectal dysfunction.  Also he reports that his former PCP about 10 years ago tried various medications but all cause some adverse side effects which he cannot tolerate.  Recently with a lot of his coworkers getting laid off and his workload increased his anxiety has worsened.  He is using Ativan a little bit more frequently than he used to formally.  In the past he was just using 1 to 2 tablets a day and that would typically last him for 4 months but I was only given him 45 tablets.  In light of recent circumstances we will increase his Ativan to 60 tablets a month on his next refill.  Due to his probable Parkinson's and history of adverse side effects to other medications for anxiety, I do plan to most likely refer him to psychiatry to get opinion on other potential medication options other than the benzodiazepines.  Will make this decision after he gets results of his scan.  Follow-up date to be determined.

## 2018-05-29 ENCOUNTER — Encounter: Payer: Self-pay | Admitting: Medical

## 2018-06-04 ENCOUNTER — Encounter: Payer: Self-pay | Admitting: Medical

## 2018-06-04 ENCOUNTER — Other Ambulatory Visit: Payer: Self-pay | Admitting: Medical

## 2018-06-04 MED ORDER — LORAZEPAM 0.5 MG PO TABS: ORAL_TABLET | ORAL | 0 refills | Status: DC

## 2018-06-04 NOTE — Telephone Encounter (Signed)
Rx refill of ativan sent to pt pharmacy. Change in therapy.

## 2018-06-05 ENCOUNTER — Ambulatory Visit (HOSPITAL_COMMUNITY): Payer: 59

## 2018-06-05 ENCOUNTER — Encounter (HOSPITAL_COMMUNITY): Payer: 59

## 2018-06-26 ENCOUNTER — Telehealth: Payer: Self-pay | Admitting: Neurology

## 2018-06-26 NOTE — Telephone Encounter (Signed)
Emailed pt link to [email or web address] visit. Will call to update his chart closer to appt.

## 2018-06-26 NOTE — Telephone Encounter (Signed)
5-13 Pt has called and gave verbal consent to file insurance for a doxy.me vv Pt email : [email or web address]  Pt understands that although there may be some limitations with this type of visit, we will take all precautions to reduce any security or privacy concerns.  Pt understands that this will be treated like an in office visit and we will file with pt's insurance, and there may be a patient responsible charge related to this service.

## 2018-06-27 NOTE — Telephone Encounter (Signed)
Patient is returning your call.  

## 2018-06-27 NOTE — Telephone Encounter (Signed)
I called pt to update his chart prior to his appt. No answer, left a message asking him to call me back. 

## 2018-06-27 NOTE — Telephone Encounter (Signed)
I called pt. Pt's meds, allergies, and PMH were updated.  Pt reports that he is doing well, his symptoms may have worsened a little bit, he is noticing more tremors on his left side.  Pt confirmed that he received the email link from [place] and understands the [email or web address] process.

## 2018-07-01 NOTE — Telephone Encounter (Signed)
Pt called in and stated that he deleted the link , link was resent to him through text message

## 2018-07-02 ENCOUNTER — Encounter: Payer: Self-pay | Admitting: Neurology

## 2018-07-02 ENCOUNTER — Encounter: Payer: Self-pay | Admitting: Medical

## 2018-07-02 ENCOUNTER — Ambulatory Visit (INDEPENDENT_AMBULATORY_CARE_PROVIDER_SITE_OTHER): Payer: 59 | Admitting: Neurology

## 2018-07-02 ENCOUNTER — Other Ambulatory Visit: Payer: Self-pay

## 2018-07-02 DIAGNOSIS — G20 Parkinson's disease: Secondary | ICD-10-CM | POA: Diagnosis not present

## 2018-07-02 MED ORDER — ROTIGOTINE 2 MG/24HR TD PT24: 1.0000 | MEDICATED_PATCH | Freq: Every day | TRANSDERMAL | 12 refills | Status: DC

## 2018-07-02 NOTE — Progress Notes (Signed)
Interim history:  Mark [name] is a 58 year old right-handed gentleman with an underlying medical history of neck pain, reflux disease, chronic tinnitus, anxiety, and allergic rhinitis, who presents for a virtual, video based appointment via [email or web address] for follow-up consultation of his parkinsonism.  The patient is accompanied by his wife, Mark [name], today and joins from home, I am located in my office.  I first met him on 04/03/2018 at the request of Dr. Barbaraann Barthel, at which time the patient reported a several month history, probably a 62-monthhistory of left hand tremor, neck stiffness, difficulty with fine motor control with the left upper extremity, and hand stiffness.  His history and physical examination were in keeping with parkinsonism on the left side.  We talked about symptomatic treatment options to be utilized soon, I suggested we proceed with a nuclear medicine DaTscan.  Due to the COVID-19 pandemic, his scan has not yet been done, he was approved for it though.  The patient sent a MyChart message in the interim in late March 2020 indicating that he has noticed more symptoms including some difficulty with his speech, including slurring, increased salivation, causing drooling at times and some symptoms in his right hand.  Today, 07/02/2018: Please also see below for virtual visit documentation.  Previously:  04/03/2018: (He) reports a left hand tremor for the past several months, likely 6-9 months. In addition, he has noted difficulty with fine motor control, he has noticed neck stiffness and left hand stiffness and difficulty performing fine motor skills with the left hand. I reviewed your office note from 01/17/2018. He has had radiating neck pain to the left arm. He has had physical therapy for this. His neck pain has improved.  He fell in 2017 and had neck pain. He had a X ray neck on 03/26/17: IMPRESSION: No acute findings.   Mild spondylosis of the cervical spine with moderate to severe multilevel  bilateral neural foraminal narrowing as described.   He denies a family history of Parkinsons disease or tremor. He denies any recent falls. He denies any constipation. Mood is fairly stable but he does admit to having a longer standing history of anxiety. He takes Ativan as needed for this. He also reports claustrophobia and having had difficulty going through an MRI in the past.   He has had some issues with becoming slower in his mobility. His brother recently commented on this. He is the youngest of 4 siblings, he has an older brother, Mark [name], then another brother. He lives with his family which includes his wife and 186year old daughter and 127year old son. He works as a sArt gallery manager He is a nonsmoker and drinks alcohol in the form of wine, maybe 3 glasses per week on average, caffeine in the form of coffee, 3 cups per day on average. He tries to hydrate well. He exercises regularly.   His Past Medical History Is Significant For: Past Medical History:  Diagnosis Date   Family history of prostate cancer     DRE WNL; PSA never > 0.38   Fasting hyperglycemia     PMH of   GERD (gastroesophageal reflux disease)    Tinnitus    w/o hearing loss    His Past Surgical History Is Significant For: Past Surgical History:  Procedure Laterality Date   COLONOSCOPY  05/2013   Neg, Dr PHenrene Pastor  KNEE SURGERY Right    TONSILLECTOMY AND ADENOIDECTOMY     VASECTOMY      His Family History Is Significant For:  Family History  Problem Relation Age of Onset   COPD Mother        smoker   Prostate cancer Father 60       no longer with prostate cancer   Breast cancer Sister    COPD Paternal Aunt        smoker   Stroke Maternal Grandfather         late 52s   Heart disease Neg Hx    Colon cancer Neg Hx    Esophageal cancer Neg Hx    Pancreatic cancer Neg Hx    Rectal cancer Neg Hx    Stomach cancer Neg Hx    Diabetes Neg Hx     His Social History Is Significant  For: Social History   Socioeconomic History   Marital status: Married    Spouse name: Not on file   Number of children: Not on file   Years of education: Not on file   Highest education level: Not on file  Occupational History   Not on file  Social Needs   Financial resource strain: Not on file   Food insecurity:    Worry: Not on file    Inability: Not on file   Transportation needs:    Medical: Not on file    Non-medical: Not on file  Tobacco Use   Smoking status: Never Smoker   Smokeless tobacco: Never Used  Substance and Sexual Activity   Alcohol use: Yes    Comment: very rare wine , < one per month per pt.   Drug use: No   Sexual activity: Yes  Lifestyle   Physical activity:    Days per week: Not on file    Minutes per session: Not on file   Stress: Not on file  Relationships   Social connections:    Talks on phone: Not on file    Gets together: Not on file    Attends religious service: Not on file    Active member of club or organization: Not on file    Attends meetings of clubs or organizations: Not on file    Relationship status: Not on file  Other Topics Concern   Not on file  Social History Narrative   Not on file    His Allergies Are:  Allergies  Allergen Reactions   Penicillins     ? Reaction @ age 48  :   His Current Medications Are:  Outpatient Encounter Medications as of 07/02/2018  Medication Sig   LORazepam (ATIVAN) 0.5 MG tablet 1 tab po every 8 hours as needed for anxiety   Multiple Vitamins-Minerals (MENS MULTIVITAMIN PLUS) TABS Take by mouth daily.   sildenafil (VIAGRA) 100 MG tablet Take 1 tablet (100 mg total) by mouth daily as needed for erectile dysfunction.   No facility-administered encounter medications on file as of 07/02/2018.   :  Review of Systems:  Out of a complete 14 point review of systems, all are reviewed and negative with the exception of these symptoms as listed below:  Virtual Visit via Video  Note on 07/02/2018:  I connected with Mark [name] on 07/02/18 at  8:30 AM EDT by a video enabled telemedicine application and verified that I am speaking with the correct person using two identifiers.   I discussed the limitations of evaluation and management by telemedicine and the availability of in person appointments. The patient expressed understanding and agreed to proceed.  History of Present Illness: He reports Feeling more or less  the same, his DaTscan was canceled twice because of the COVID-19 pandemic.  He was told that it would probably be in early June.  He is still willing to proceed with a scan.  He reports Some slight weakness in the right hand, more stiffness on the left side and more frustration with the left side in terms of slowness, dexterity.  Mark [name] has noticed over the past year that he Involuntarily keeps his mouth open often.  She has noticed drooling especially at dinnertime, she has also noticed that he tends to cough and not always swallow fully and this can happen with food or liquids.  She has over the past 6+ months she seen a significant decline in his speech in the sense that he has garbled speech and very soft speech, she even had her own hearing tested for this because she was not able to understand him.  She is worried about him overdoing it physically, he works full-time and walks a lot at work, averaging about 4 to 5 miles during the day at work and then he also likes to work out including running and additional walking for exercise.  She had him scale back as she felt he was overdoing it.She has noticed no sleep issues, he is a good sleeper, he is a sound sleeper, no dream enactment, no sleep talking, no snoring significantly or apneas noted thankfully.He has been going to work.His wife and his 2 teenage kids are at home currently. He has a very good support system.   Observations/Objective: There are no recent vital signs available for my review today, the most recent  documented vital signs are from our visit on 04/03/2018. On examination, he is very pleasant, conversant but not very verbal today.  His history is supplemented by his wife.,  His speech is hypophonic and slow.  He has moderate facial masking, no obvious sialorrhea, eye blink rate is reduced, extraocular movements slightly saccadic.  Hearing is grossly intact, he has slight decrease in active range of motion in his neck.  He has overall moderate appearing bradykinesia.  He has no obvious tremors, especially not in the head and neck area.  Assessment and Plan: In summary, Mark [name] is a very pleasant 58 year old male with an underlying medical history of neck pain, reflux disease, chronic tinnitus, anxiety, and allergic rhinitis, who presents for A virtual, video based appointment via [email or web address] for follow-up consultation of his parkinsonism.  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  He is young.  He may have had a more progressive course then I for suspected, nevertheless, we have to monitor his symptoms and examination with time.  I had a very extended conversation today with both the patient and his very involved wife.  We mutually agreed that he should continue to stay active physically and mentally but also not overdo it with his physical exercise.  We talked extensively about medication options and I suggested we proceed with a dopamine agonist.  His wife adds that he is quite sensitive to medications. Of note, he has been on Ativan for years.  He takes 0.5 mg strength, about 1-1/2 pills daily, it does not affect his driving or his ability to work he indicates.  He is not sleepy from it. He is going to take vacation time for a week or so when he starts the medication.  I suggested Neupro patch 2 mg strength  once every 24 hours.  I talked about expectations, limitations,  patch application and rotation, safe disposal of the patch, side effects including impulse control disorder with dopamine agonists. While he does not have an obvious resting tremor today, he does have signs of parkinsonism, particularly with left-sided lateralization and concerning for underlying akinetic-rigid type Parkinson's disease. I explained to him that a definitive diagnosis is not always possible or easy; Given his young age, I would like to continue to pursue the DaTscan which has been on hold for now, Given the COVID-19 pandemic. We talked about maintaining a healthy lifestyle in general.  I would like to see him back in about 3-4 months, sooner if needed. We will keep them posted in the interim about his scan results.  I answered all their questions today and the patient and his wife were in agreement.   Follow Up Instructions:    I discussed the assessment and treatment plan with the patient. The patient was provided an opportunity to ask questions and all were answered. The patient agreed with the plan and demonstrated an understanding of the instructions.   The patient was advised to call back or seek an in-person evaluation if the symptoms worsen or if the condition fails to improve as anticipated.    I provided 40 minutes of non-face-to-face time during this encounter.   Star Age, MD

## 2018-07-02 NOTE — Patient Instructions (Signed)
Given verbally, during today's virtual video-based encounter, with verbal feedback received.   

## 2018-07-03 ENCOUNTER — Telehealth: Payer: Self-pay | Admitting: Medical

## 2018-07-03 ENCOUNTER — Other Ambulatory Visit: Payer: Self-pay | Admitting: Medical

## 2018-07-03 ENCOUNTER — Telehealth: Payer: Self-pay

## 2018-07-03 ENCOUNTER — Encounter: Payer: Self-pay | Admitting: Neurology

## 2018-07-03 MED ORDER — LORAZEPAM 0.5 MG PO TABS: ORAL_TABLET | ORAL | 0 refills | Status: DC

## 2018-07-03 NOTE — Telephone Encounter (Signed)
I called pt to schedule his 3 mo f/u with Dr. Frances Furbish. No answer, left a message asking pt to call us back. When pt calls back, please help him with this.

## 2018-07-03 NOTE — Telephone Encounter (Signed)
Rx refill of ativan sent in.

## 2018-07-18 ENCOUNTER — Encounter: Payer: Self-pay | Admitting: Neurology

## 2018-07-18 ENCOUNTER — Telehealth: Payer: Self-pay | Admitting: Neurology

## 2018-07-18 MED ORDER — ROTIGOTINE 4 MG/24HR TD PT24: 1.0000 | MEDICATED_PATCH | Freq: Every day | TRANSDERMAL | 12 refills | Status: DC

## 2018-07-18 NOTE — Telephone Encounter (Signed)
Responded to pt via mychart

## 2018-07-18 NOTE — Telephone Encounter (Signed)
Based on our email conversation from today I will increase his Neupro patch to 4 mg strength, prescription done, please update patient via my chart.

## 2018-08-05 ENCOUNTER — Other Ambulatory Visit: Payer: Self-pay | Admitting: Medical

## 2018-08-05 ENCOUNTER — Encounter: Payer: Self-pay | Admitting: Neurology

## 2018-08-05 NOTE — Telephone Encounter (Signed)
Please advise 

## 2018-08-06 ENCOUNTER — Encounter: Payer: Self-pay | Admitting: Medical

## 2018-08-06 NOTE — Telephone Encounter (Signed)
Rx ativan sent to pt pharmacy.  

## 2018-08-07 ENCOUNTER — Ambulatory Visit (HOSPITAL_COMMUNITY)
Admission: RE | Admit: 2018-08-07 | Discharge: 2018-08-07 | Disposition: A | Discharge status: Home / Self Care | Payer: 59 | Source: Ambulatory Visit | Attending: Neurology | Admitting: Neurology

## 2018-08-07 ENCOUNTER — Other Ambulatory Visit: Payer: Self-pay

## 2018-08-07 ENCOUNTER — Encounter (HOSPITAL_COMMUNITY)
Admission: RE | Admit: 2018-08-07 | Discharge: 2018-08-07 | Disposition: A | Discharge status: Home / Self Care | Payer: 59 | Source: Ambulatory Visit | Attending: Neurology | Admitting: Neurology

## 2018-08-07 DIAGNOSIS — G20 Parkinson's disease: Secondary | ICD-10-CM | POA: Insufficient documentation

## 2018-08-07 MED ORDER — IODINE STRONG (LUGOLS) 5 % PO SOLN
0.8000 mL | Freq: Once | ORAL | Status: AC
  Administered 2018-08-07: 0.8 mL via ORAL

## 2018-08-07 MED ORDER — IOFLUPANE I 123 185 MBQ/2.5ML IV SOLN
3.9000 | Freq: Once | INTRAVENOUS | Status: AC | PRN
  Administered 2018-08-07: 3.9 via INTRAVENOUS
  Filled 2018-08-07: qty 5

## 2018-08-08 ENCOUNTER — Telehealth: Payer: Self-pay

## 2018-08-08 NOTE — Telephone Encounter (Signed)
Pt returned my call and I discussed his DaT scan results and recommendations. Pt was scheduled for 8/18/20at 8:30am. Pt verbalized understanding of new appt date and time and results. Pt had no questions at this time but was encouraged to call back if questions arise.

## 2018-08-08 NOTE — Telephone Encounter (Signed)
-----   Message from Star Age, MD sent at 08/08/2018  8:03 AM EDT ----- Please call patient or his wife about the recent DaT scan: This is a specialized brain scan designed to help with diagnosis of tremor disorders. A radioactive marker gets injected and the uptake is measured in the brain and compared to normal controls and right side is compared to the left, a change in uptake can help with diagnosis of certain tremor disorders. His scan did confirm abnormal (as in lower) uptake as compared to normal uptake pattern indicating a parkinsonian disorder; findings are supporting his Dx of Parkinson's disease. We will continue as planned and follow up as scheduled.  Star Age, MD, PhD [facility])

## 2018-08-08 NOTE — Telephone Encounter (Signed)
I called pt to discuss his DaT scan results. No answers, left a message asking him to call me back.

## 2018-08-08 NOTE — Progress Notes (Signed)
Please call patient or his wife about the recent DaT scan: This is a specialized brain scan designed to help with diagnosis of tremor disorders. A radioactive marker gets injected and the uptake is measured in the brain and compared to normal controls and right side is compared to the left, a change in uptake can help with diagnosis of certain tremor disorders. His scan did confirm abnormal (as in lower) uptake as compared to normal uptake pattern indicating a parkinsonian disorder; findings are supporting his Dx of Parkinson's disease. We will continue as planned and follow up as scheduled.  Star Age, MD, PhD [facility])

## 2018-08-09 ENCOUNTER — Encounter: Payer: Self-pay | Admitting: Neurology

## 2018-08-15 ENCOUNTER — Telehealth: Payer: Self-pay | Admitting: Neurology

## 2018-08-15 ENCOUNTER — Encounter: Payer: Self-pay | Admitting: Neurology

## 2018-08-15 MED ORDER — ROTIGOTINE 6 MG/24HR TD PT24: 1.0000 | MEDICATED_PATCH | Freq: Every day | TRANSDERMAL | 12 refills | Status: DC

## 2018-08-15 NOTE — Telephone Encounter (Signed)
Please see email from today, I will increase Neupro to 6 mg for the next Rx. I will email back, no further action required, FYI.

## 2018-09-08 ENCOUNTER — Other Ambulatory Visit: Payer: Self-pay | Admitting: Medical

## 2018-09-09 NOTE — Telephone Encounter (Addendum)
Refill Request: Lorazepam  Last RX:08/06/18 Last OV:05/18/18 Next HR:CBUL scheduled  UDS:03/06/18 CSC:10/22/17 AGT:XMIWO 09/09/2018.   Rx refill sent.

## 2018-09-22 ENCOUNTER — Encounter: Payer: Self-pay | Admitting: Medical

## 2018-09-23 ENCOUNTER — Other Ambulatory Visit: Payer: Self-pay

## 2018-09-26 ENCOUNTER — Other Ambulatory Visit: Payer: Self-pay

## 2018-09-26 ENCOUNTER — Ambulatory Visit (HOSPITAL_BASED_OUTPATIENT_CLINIC_OR_DEPARTMENT_OTHER)
Admission: RE | Admit: 2018-09-26 | Discharge: 2018-09-26 | Disposition: A | Discharge status: Home / Self Care | Payer: 59 | Source: Ambulatory Visit | Attending: Medical | Admitting: Medical

## 2018-09-26 ENCOUNTER — Telehealth: Payer: Self-pay | Admitting: Medical

## 2018-09-26 ENCOUNTER — Encounter: Payer: Self-pay | Admitting: Medical

## 2018-09-26 ENCOUNTER — Ambulatory Visit: Payer: 59 | Admitting: Medical

## 2018-09-26 VITALS — BP 111/80 | HR 60 | Temp 97.5°F | Resp 16 | Ht 73.0 in | Wt 171.6 lb

## 2018-09-26 DIAGNOSIS — R102 Pelvic and perineal pain: Secondary | ICD-10-CM | POA: Diagnosis not present

## 2018-09-26 DIAGNOSIS — M7989 Other specified soft tissue disorders: Secondary | ICD-10-CM | POA: Diagnosis present

## 2018-09-26 DIAGNOSIS — R350 Frequency of micturition: Secondary | ICD-10-CM

## 2018-09-26 DIAGNOSIS — R1030 Lower abdominal pain, unspecified: Secondary | ICD-10-CM | POA: Diagnosis not present

## 2018-09-26 LAB — POC URINALSYSI DIPSTICK (AUTOMATED)
Bilirubin, UA: NEGATIVE
Blood, UA: NEGATIVE
Glucose, UA: NEGATIVE
Ketones, UA: NEGATIVE
Leukocytes, UA: NEGATIVE
Nitrite, UA: NEGATIVE
Protein, UA: NEGATIVE
Spec Grav, UA: 1.01 (ref 1.010–1.025)
Urobilinogen, UA: 0.2 E.U./dL
pH, UA: 7 (ref 5.0–8.0)

## 2018-09-26 LAB — PSA: PSA: 0.46 ng/mL (ref 0.10–4.00)

## 2018-09-26 NOTE — Patient Instructions (Signed)
Your recent describe groin pain and perineum pain is now resolved.  You might have had a groin muscle strain but also considering that she might of had some mild prostatitis.  On exam I did not feel any inguinal hernias.  I do want you to get PSA blood test today and will get urine studies including urine culture to evaluate if you had recent infection.  If studies are negative still would like to watch you closely and please update [place] if you have any recurrent signs/symptoms.  You mention at the end of interview that she had some intermittent left calf region swelling and left upper extremity swelling.  Calves are symmetric presently and you do not have any pain behind her knee.  If you have any asymmetric calf swelling or any pain behind her knee please notify us and try to be seen ASAP.  If we are not available/both regarding scheduling then would recommend emergency department evaluation.  You do have left upper extremity swelling today compared to the right side.  I do think is best to go ahead and do a left upper extremity ultrasound today to rule out diagnosis such as DVT.  Presently you are scheduled at 230 today.  Follow-up in 2 to 3 months for chronic conditions but sooner if exacerbation/recurrence  of today's diagnosis.

## 2018-09-26 NOTE — Progress Notes (Signed)
Subjective:    Patient ID: Mark MansJeffrey Leverett, male    DOB: April 13, 1960, 58 y.o.   MRN: [id number]  HPI  Pt in for evaluation.   He states he had some groin pain recently. He states with is work he has been doing more physically demanding. 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 Now walking 4-5 miles a day and still jogs. Overall had hurt consecutive for 3 days when he sent my chart message but last 3 days no pain.  He states when had pain was dull groin pain on both sides for 3 days. Also at that time had faint pain in perineum area. He did not some frequent urination at that time as well. Pt had normal psa in February.  When had pain walking would hurt in groins minimally.  Also mentioned at end occasional left upper ext swelling and left leg swelling. Past 2 days. More upper arm. Watch felt real tight.   Review of Systems  Constitutional: Negative for chills, fatigue and fever.  Respiratory: Negative for cough, chest tightness, shortness of breath and wheezing.   Cardiovascular: Negative for chest pain and palpitations.  Gastrointestinal: Negative for abdominal pain.  Genitourinary: Positive for frequency. Negative for dysuria, hematuria, penile pain, testicular pain and urgency.       About for one day out of 3 days when had groin pain.  Brief low level perineum pain.  Musculoskeletal: Negative for back pain and joint swelling.  Skin: Negative for rash.  Neurological: Negative for dizziness, seizures, speech difficulty, weakness and headaches.  Hematological: Negative for adenopathy. Does not bruise/bleed easily.  Psychiatric/Behavioral: Negative for behavioral problems. The patient is not nervous/anxious.     Past Medical History:  Diagnosis Date  . Family history of prostate cancer     DRE WNL; PSA never > 0.38  . Fasting hyperglycemia     PMH of  . GERD (gastroesophageal reflux disease)   . Tinnitus    w/o hearing loss     Social History    Socioeconomic History  . Marital status: Married    Spouse name: Not on file  . Number of children: Not on file  . Years of education: Not on file  . Highest education level: Not on file  Occupational History  . Not on file  Social Needs  . Financial resource strain: Not on file  . Food insecurity    Worry: Not on file    Inability: Not on file  . Transportation needs    Medical: Not on file    Non-medical: Not on file  Tobacco Use  . Smoking status: Never Smoker  . Smokeless tobacco: Never Used  Substance and Sexual Activity  . Alcohol use: Yes    Comment: very rare wine , < one per month per pt.  . Drug use: No  . Sexual activity: Yes  Lifestyle  . Physical activity    Days per week: Not on file    Minutes per session: Not on file  . Stress: Not on file  Relationships  . Social Musicianconnections    Talks on phone: Not on file    Gets together: Not on file    Attends religious service: Not on file    Active member of club or organization: Not on file    Attends meetings of clubs or organizations: Not on file    Relationship status: Not on file  . Intimate partner violence    Fear of current  or ex partner: Not on file    Emotionally abused: Not on file    Physically abused: Not on file    Forced sexual activity: Not on file  Other Topics Concern  . Not on file  Social History Narrative  . Not on file    Past Surgical History:  Procedure Laterality Date  . COLONOSCOPY  05/2013   Neg, Dr [name]  . KNEE SURGERY Right   . TONSILLECTOMY AND ADENOIDECTOMY    . VASECTOMY      Family History  Problem Relation Age of Onset  . COPD Mother        smoker  . Prostate cancer Father [id number]       no longer with prostate cancer  . Breast cancer Sister   . COPD Paternal Aunt        smoker  . Stroke Maternal Grandfather         late 960s  . Heart disease Neg Hx   . Colon cancer Neg Hx   . Esophageal cancer Neg Hx   . Pancreatic cancer Neg Hx   . Rectal cancer Neg Hx   .  Stomach cancer Neg Hx   . Diabetes Neg Hx     Allergies  Allergen Reactions  . Penicillins     ? Reaction @ age 89    Current Outpatient Medications on File Prior to Visit  Medication Sig Dispense Refill  . LORazepam (ATIVAN) 0.5 MG tablet TAKE ONE TABLET BY MOUTH EVERY 8 HOURS AS NEEDED FOR ANXIETY 60 tablet 0  . Multiple Vitamins-Minerals (MENS MULTIVITAMIN PLUS) TABS Take by mouth daily.    . rotigotine (NEUPRO) 6 MG/24HR Place 1 patch onto the skin daily. 30 patch 12  . sildenafil (VIAGRA) 100 MG tablet Take 1 tablet (100 mg total) by mouth daily as needed for erectile dysfunction. 10 tablet 0   No current facility-administered medications on file prior to visit.     BP 111/80   Pulse 60   Temp (!) 97.5 F (36.4 C) (Temporal)   Resp 16   Ht 6\' 1"  (1.854 m)   Wt 171 lb 9.6 oz (77.8 kg)   SpO2 100%   BMI 22.64 kg/m       Objective:   Physical Exam  General- No acute distress. Pleasant patient. Neck- Full range of motion, no jvd Lungs- Clear, even and unlabored. Heart- regular rate and rhythm. Neurologic- CNII- XII grossly intact.  Genital exam- inguinal canals are free from herniation. Normal genitals. Rectal- smooth prostate. No nodules. Minimal mild enlargment. symettric prostate,  Upper ext- left upper arm 11 inch. Rt arm 10 inch. Rt handed. calfs- symettric about 14 inch.       Assessment & Plan:  Your recent describe groin pain and perineum pain is now resolved.  You might have had a groin muscle strain but also considering that she might of had some mild prostatitis.  On exam I did not feel any inguinal hernias.  I do want you to get PSA blood test today and will get urine studies including urine culture to evaluate if you had recent infection.  If studies are negative still would like to watch you closely and please update us if you have any recurrent signs/symptoms.  You mention at the end of interview that she had some intermittent left calf region  swelling and left upper extremity swelling.  Calves are symmetric presently and you do not have any pain behind her knee.  If you  have any asymmetric calf swelling or any pain behind her knee please notify us and try to be seen ASAP.  If we are not available/both regarding scheduling then would recommend emergency department evaluation.  You do have left upper extremity swelling today compared to the right side.  I do think is best to go ahead and do a left upper extremity ultrasound today to rule out diagnosis such as DVT.  Presently you are scheduled at 230 today.  Follow-up in 2 to 3 months for chronic conditions but sooner if exacerbation/recurrence  of today's diagnosis.  25 minute spent with pt. 50% of time spent pt today counseling on diff dx and work up/plan going forward.  Mackie Pai, PA-C

## 2018-09-26 NOTE — Telephone Encounter (Signed)
LM to call and schedule 2-3 month f/u

## 2018-09-27 ENCOUNTER — Encounter: Payer: Self-pay | Admitting: Medical

## 2018-09-27 ENCOUNTER — Encounter: Payer: Self-pay | Admitting: Neurology

## 2018-09-28 LAB — URINE CULTURE
MICRO NUMBER:: 768385
Result:: NO GROWTH
SPECIMEN QUALITY:: ADEQUATE

## 2018-09-30 ENCOUNTER — Telehealth: Payer: Self-pay | Admitting: Medical

## 2018-09-30 DIAGNOSIS — M7989 Other specified soft tissue disorders: Secondary | ICD-10-CM

## 2018-09-30 NOTE — Telephone Encounter (Signed)
[place] left lower ext order placed.

## 2018-10-01 ENCOUNTER — Encounter: Payer: Self-pay | Admitting: Neurology

## 2018-10-01 ENCOUNTER — Ambulatory Visit: Payer: 59 | Admitting: Neurology

## 2018-10-01 ENCOUNTER — Other Ambulatory Visit: Payer: Self-pay

## 2018-10-01 VITALS — BP 128/84 | HR 77 | Ht 72.5 in | Wt 171.0 lb

## 2018-10-01 DIAGNOSIS — G20 Parkinson's disease: Secondary | ICD-10-CM

## 2018-10-01 NOTE — Patient Instructions (Signed)
You are doing well, your exam is stable, you have noticed some improvement with the Neupro 6 mg strength, please continue with the patch.  Please be mindful of the importance of exercise but do not overdo it.  Please be proactive about constipation issues, stay well-hydrated and well rested.

## 2018-10-01 NOTE — Progress Notes (Signed)
Subjective:    Patient ID: Mark [name] is a 58 y.o. male.  HPI     Interim history:   Mark [name] is a 58 year old right-handed gentleman with an underlying medical history of neck pain, reflux disease, chronic tinnitus, anxiety, and allergic rhinitis, who presents for follow-up consultation of his parkinsonism, likely left-sided predominant Parkinson's disease with akinetic-rigid presentation.  The patient is accompanied by his wife Mark [name] again today.  I last saw him on 07/02/2018 in virtual visit, at which time we mutually agreed to start him on Neupro patch.  We increased it in the recent past to 6 mg daily.  He emailed recently regarding some swelling he had noticed in the left upper and lower extremities.  He had an ultrasound of the left upper extremity which was negative for a blood clot.  Today, 10/01/2018: He reports doing better.  His stiffness is better and sometimes he feels like he is moving a little quicker.  He still has stiffness primarily in the left upper extremity which sometimes is frustrating for him.  Thankfully, he has had no major issues or new problems, no fall, no difficulty with sleep, no constipation, anxiety if anything is a little better.  He has a little bit of a euphoria type response when he first puts the patch on every morning but this is not as noticeable now as compared to in the beginning.  He does not use his Ativan quite as much as before he reports.  His wife feels that he overdoes it sometimes physically.  He has a lot of walking involved at work, he walks about 4 to 5 miles and stands a lot at work.  He also likes to run in addition to that.  He used to run a marathon.  He no longer does that but does feel repercussions of overdoing it physically from time to time.  Recently he noticed some patchy areas of numbness in the left leg as well as some burning sensation which comes and goes.  He noticed it primarily this past weekend when he did a lot of physical  activity with his kids including basketball and volleyball.  The sensory symptoms lasted about a day.  He tries to hydrate well with water even at work.  He likes to drink 2 cups of coffee in the mornings.     The patient's allergies, current medications, family history, past medical history, past social history, past surgical history and problem list were reviewed and updated as appropriate.   Previously:   I first met him on 04/03/2018 at the request of Dr. Barbaraann Barthel, at which time the patient reported a several month history, probably a 61-monthhistory of left hand tremor, neck stiffness, difficulty with fine motor control with the left upper extremity, and hand stiffness.  His history and physical examination were in keeping with parkinsonism on the left side.  We talked about symptomatic treatment options to be utilized soon, I suggested we proceed with a nuclear medicine DaTscan.  Due to the COVID-19 pandemic, his scan has not yet been done, he was approved for it though.   The patient sent a MyChart message in the interim in late March 2020 indicating that he has noticed more symptoms including some difficulty with his speech, including slurring, increased salivation, causing drooling at times and some symptoms in his right hand.     04/03/2018: (He) reports a left hand tremor for the past several months, likely 6-9 months. In addition, he has noted  difficulty with fine motor control, he has noticed neck stiffness and left hand stiffness and difficulty performing fine motor skills with the left hand. I reviewed your office note from 01/17/2018. He has had radiating neck pain to the left arm. He has had physical therapy for this. His neck pain has improved.  He fell in 2017 and had neck pain. He had a X ray neck on 03/26/17: IMPRESSION: No acute findings.   Mild spondylosis of the cervical spine with moderate to severe multilevel bilateral neural foraminal narrowing as described.   He denies a  family history of Parkinson's disease or tremor. He denies any recent falls. He denies any constipation. Mood is fairly stable but he does admit to having a longer standing history of anxiety. He takes Ativan as needed for this. He also reports claustrophobia and having had difficulty going through an MRI in the past.   He has had some issues with becoming slower in his mobility. His brother recently commented on this. He is the youngest of 4 siblings, he has an older brother, Simona Huh sister, then another brother. He lives with his family which includes his wife and 64 year old daughter and 7 year old son. He works as a Art gallery manager. He is a nonsmoker and drinks alcohol in the form of wine, maybe 3 glasses per week on average, caffeine in the form of coffee, 3 cups per day on average. He tries to hydrate well. He exercises regularly.  His Past Medical History Is Significant For: Past Medical History:  Diagnosis Date  . Family history of prostate cancer     DRE WNL; PSA never > 0.38  . Fasting hyperglycemia     PMH of  . GERD (gastroesophageal reflux disease)   . Tinnitus    w/o hearing loss    His Past Surgical History Is Significant For: Past Surgical History:  Procedure Laterality Date  . COLONOSCOPY  05/2013   Neg, Dr Henrene Pastor  . KNEE SURGERY Right   . TONSILLECTOMY AND ADENOIDECTOMY    . VASECTOMY      His Family History Is Significant For: Family History  Problem Relation Age of Onset  . COPD Mother        smoker  . Prostate cancer Father 30       no longer with prostate cancer  . Breast cancer Sister   . COPD Paternal Aunt        smoker  . Stroke Maternal Grandfather         late 79s  . Heart disease Neg Hx   . Colon cancer Neg Hx   . Esophageal cancer Neg Hx   . Pancreatic cancer Neg Hx   . Rectal cancer Neg Hx   . Stomach cancer Neg Hx   . Diabetes Neg Hx     His Social History Is Significant For: Social History   Socioeconomic History  . Marital status:  Married    Spouse name: Not on file  . Number of children: Not on file  . Years of education: Not on file  . Highest education level: Not on file  Occupational History  . Not on file  Social Needs  . Financial resource strain: Not on file  . Food insecurity    Worry: Not on file    Inability: Not on file  . Transportation needs    Medical: Not on file    Non-medical: Not on file  Tobacco Use  . Smoking status: Never Smoker  . Smokeless tobacco:  Never Used  Substance and Sexual Activity  . Alcohol use: Yes    Comment: very rare wine , < one per month per pt.  . Drug use: No  . Sexual activity: Yes  Lifestyle  . Physical activity    Days per week: Not on file    Minutes per session: Not on file  . Stress: Not on file  Relationships  . Social Herbalist on phone: Not on file    Gets together: Not on file    Attends religious service: Not on file    Active member of club or organization: Not on file    Attends meetings of clubs or organizations: Not on file    Relationship status: Not on file  Other Topics Concern  . Not on file  Social History Narrative  . Not on file    His Allergies Are:  Allergies  Allergen Reactions  . Penicillins     ? Reaction @ age 74  :   His Current Medications Are:  Outpatient Encounter Medications as of 10/01/2018  Medication Sig  . LORazepam (ATIVAN) 0.5 MG tablet TAKE ONE TABLET BY MOUTH EVERY 8 HOURS AS NEEDED FOR ANXIETY  . Multiple Vitamins-Minerals (MENS MULTIVITAMIN PLUS) TABS Take by mouth daily.  . rotigotine (NEUPRO) 6 MG/24HR Place 1 patch onto the skin daily.  . sildenafil (VIAGRA) 100 MG tablet Take 1 tablet (100 mg total) by mouth daily as needed for erectile dysfunction.   No facility-administered encounter medications on file as of 10/01/2018.   :  Review of Systems:  Out of a complete 14 point review of systems, all are reviewed and negative with the exception of these symptoms as listed below: Review of  Systems  Neurological:       Pt presents today for follow up. Pt feels that he has been stable. He has noticed some swelling in his left arm and ankle.    Objective:  Neurological Exam  Physical Exam Physical Examination:   Vitals:   10/01/18 0836  BP: 128/84  Pulse: 77    General Examination: The patient is a very pleasant 58 y.o. male in no acute distress. He appears well-developed and well-nourished and well groomed.   HEENT: Normocephalic, atraumatic, pupils are equal, round and reactive to light and accommodation. He has mild facial masking and mildly decreased eye blink rate. Hearing is grossly intact. Face is otherwise symmetric, no dysarthria, minimal hypophonia potentially. He has moderate nuchal rigidity. No carotid bruits. Airway examination reveals mild to moderate mouth dryness, otherwise tongue protrudes centrally and palate elevates symmetrically, and no lip, neck or jaw tremor.  Chest: Clear to auscultation without wheezing, rhonchi or crackles noted.  Heart: S1+S2+0, regular and normal without murmurs, rubs or gallops noted.   Abdomen: Soft, non-tender and non-distended with normal bowel sounds appreciated on auscultation.  Extremities: There is no pitting edema in the distal lower extremities bilaterally.   Skin: Warm and dry without trophic changes noted.  Musculoskeletal: exam reveals no obvious joint deformities, tenderness or joint swelling or erythema.   Neurologically:  Mental status: The patient is awake, alert and oriented in all 4 spheres. His immediate and remote memory, attention, language skills and fund of knowledge are appropriate. There is no evidence of aphasia, agnosia, apraxia or anomia. Speech is clear with normal prosody and enunciation. Thought process is linear. Mood is normal and affect is normal.   (On 04/03/2018: on Archimedes spiral drawing he has no significant trembling  with either hand, handwriting with the right hand is  legible, not tremulous, slightly micrographic.)  He has no obvious resting tremor, no significant postural or action tremor. Cranial nerves II - XII are as described above under HEENT exam. In addition: shoulder shrug is normal with equal shoulder height noted. At baseline, mildly elevated right shoulder noted. Motor exam: Normal bulk, and strength, tone is increased in the left upper extremity with mild cogwheeling noted, slight increase in tone in the right upper extremity noted. Otherwise tone is normal. He has no obvious resting tremor. Fine motor skills are moderately impaired with finger taps and hand movements of the left upper extremity, otherwise benign in the upper extremities, foot taps are mildly impaired minimally on the left and normal on the right. Cerebellar testing: No dysmetria or intention tremor on finger to nose testing. There is no truncal or gait ataxia.  Sensory exam: intact to light touch in the upper and lower extremities.  Gait, station and balance: He stands easily. No veering to one side is noted. No leaning to one side is noted. Posture is Fairly age-appropriate. Stance is narrow based. Gait shows fairly good pace and stride length but near absence of arm swing on the left. Balance appears preserved.  Assessment and plan:   In summary, Mark [name] is a very pleasant 58 year old male with an underlying medical history of neck pain, reflux disease, chronic tinnitus, anxiety, and allergic rhinitis, who presents for FU consultation of his parkinsonism, likely L sided Parkinson's disease. His DaT scan from 08/07/18 showed "Near absent activity in the putamen and asymmetric decreased activity in the head of the RIGHT caudate nucleus is a pattern suggestive of Parkinson's syndrome pathology." He has been on Neupro since May 2020. He has noted Improvement in his dexterity and slowness but still has stiffness in the left hand which is sometimes frustrating.  He has noticed  decrease in daytime energy and more exhaustion.  His voice becomes weaker by the end of the day and he feels quite fatigued at the end of the day.  He has a tendency to work out very extensively at times and likes to run.  He used to run marathon. His wife feels that he overdoes it sometimes.I suggested he continue with the Neupro patch.  He is advised to monitor for swelling as dopamine agonists can sometimes cause edema.  He had a ultrasound of the left upper extremity recently ruling out a clot On 09/26/2018.  He is advised to be mindful about fatigue and exhaustion and not to overdo his exercise regimen.  He is quite active at work and is on his feet and also walks a lot about 4 to 5 miles per day on average. We talked about his DaTscan results today.  We talked about the importance of maintaining a healthy lifestyle.  He is advised to pace himself.  Memory wise he is stable, mood wise he is doing well, sleep wise he is doing well. I Suggested a FU in 4 months, sooner if needed.  I answered all their questions today and the patient and his wife were in agreement with the above outlined plan.  I spent 40 minutes in total face-to-face time with the patient, more than 50% of which was spent in counseling and coordination of care, reviewing test results, reviewing medication and discussing or reviewing the diagnosis of PD, its prognosis and treatment options. Pertinent laboratory and imaging test results that were available during this visit with the  patient were reviewed by me and considered in my medical decision making (see chart for details).

## 2018-10-09 ENCOUNTER — Other Ambulatory Visit: Payer: Self-pay | Admitting: Medical

## 2018-10-09 NOTE — Telephone Encounter (Addendum)
Last written: 09/09/2018 Last ov: 09/26/2018 Next ov: none Contract: 10-30-2017 UDS: 03-06-2018  Refilled ativan today.

## 2018-10-17 ENCOUNTER — Telehealth: Payer: Self-pay | Admitting: Medical

## 2018-10-17 NOTE — Telephone Encounter (Signed)
CALLED PT TO SCHEDULE CPE LEFT MSG

## 2018-10-25 ENCOUNTER — Encounter: Payer: Self-pay | Admitting: Medical

## 2018-10-25 ENCOUNTER — Encounter: Payer: Self-pay | Admitting: Neurology

## 2018-10-25 ENCOUNTER — Ambulatory Visit (INDEPENDENT_AMBULATORY_CARE_PROVIDER_SITE_OTHER): Payer: 59 | Admitting: Medical

## 2018-10-25 ENCOUNTER — Other Ambulatory Visit: Payer: Self-pay

## 2018-10-25 VITALS — BP 118/79 | HR 69 | Temp 98.0°F | Resp 16 | Ht 72.5 in | Wt 175.8 lb

## 2018-10-25 DIAGNOSIS — Z0000 Encounter for general adult medical examination without abnormal findings: Secondary | ICD-10-CM | POA: Diagnosis not present

## 2018-10-25 DIAGNOSIS — Z23 Encounter for immunization: Secondary | ICD-10-CM | POA: Diagnosis not present

## 2018-10-25 LAB — CBC WITH DIFFERENTIAL/PLATELET
Basophils Absolute: 0 10*3/uL (ref 0.0–0.1)
Basophils Relative: 0.6 % (ref 0.0–3.0)
Eosinophils Absolute: 0.1 10*3/uL (ref 0.0–0.7)
Eosinophils Relative: 1.2 % (ref 0.0–5.0)
HCT: 40.6 % (ref 39.0–52.0)
Hemoglobin: 13.8 g/dL (ref 13.0–17.0)
Lymphocytes Relative: 27.2 % (ref 12.0–46.0)
Lymphs Abs: 1.1 10*3/uL (ref 0.7–4.0)
MCHC: 33.9 g/dL (ref 30.0–36.0)
MCV: 94.8 fl (ref 78.0–100.0)
Monocytes Absolute: 0.4 10*3/uL (ref 0.1–1.0)
Monocytes Relative: 10.5 % (ref 3.0–12.0)
Neutro Abs: 2.6 10*3/uL (ref 1.4–7.7)
Neutrophils Relative %: 60.5 % (ref 43.0–77.0)
Platelets: 163 10*3/uL (ref 150.0–400.0)
RBC: 4.28 Mil/uL (ref 4.22–5.81)
RDW: 12.7 % (ref 11.5–15.5)
WBC: 4.2 10*3/uL (ref 4.0–10.5)

## 2018-10-25 LAB — COMPREHENSIVE METABOLIC PANEL
ALT: 38 U/L (ref 0–53)
AST: 31 U/L (ref 0–37)
Albumin: 4.3 g/dL (ref 3.5–5.2)
Alkaline Phosphatase: 51 U/L (ref 39–117)
BUN: 13 mg/dL (ref 6–23)
CO2: 30 mEq/L (ref 19–32)
Calcium: 9.3 mg/dL (ref 8.4–10.5)
Chloride: 102 mEq/L (ref 96–112)
Creatinine, Ser: 0.84 mg/dL (ref 0.40–1.50)
GFR: 94.56 mL/min (ref >60.00)
Glucose, Bld: 87 mg/dL (ref 70–99)
Potassium: 4.1 mEq/L (ref 3.5–5.1)
Sodium: 139 mEq/L (ref 135–145)
Total Bilirubin: 0.9 mg/dL (ref 0.2–1.2)
Total Protein: 6.7 g/dL (ref 6.0–8.3)

## 2018-10-25 LAB — LIPID PANEL
Cholesterol: 197 mg/dL (ref 0–200)
HDL: 76.2 mg/dL (ref >39.00)
LDL Cholesterol: 111 mg/dL — ABNORMAL HIGH (ref 0–99)
NonHDL: 120.36
Total CHOL/HDL Ratio: 3
Triglycerides: 49 mg/dL (ref 0.0–149.0)
VLDL: 9.8 mg/dL (ref 0.0–40.0)

## 2018-10-25 NOTE — Patient Instructions (Addendum)
For you wellness exam today I have ordered cbc, cmp and lipid panel.  Flu vaccine today.  Recommend exercise and healthy diet.  We will let you know lab results as they come in.  Follow up date appointment will be determined after lab review.   Refill ativan when due. Up to date on contract and uds.   Preventive Care 62-56 Years Old, Male Preventive care refers to lifestyle choices and visits with your health care provider that can promote health and wellness. This includes:  A yearly physical exam. This is also called an annual well check.  Regular dental and eye exams.  Immunizations.  Screening for certain conditions.  Healthy lifestyle choices, such as eating a healthy diet, getting regular exercise, not using drugs or products that contain nicotine and tobacco, and limiting alcohol use. What can I expect for my preventive care visit? Physical exam Your health care provider will check:  Height and weight. These may be used to calculate body mass index (BMI), which is a measurement that tells if you are at a healthy weight.  Heart rate and blood pressure.  Your skin for abnormal spots. Counseling Your health care provider may ask you questions about:  Alcohol, tobacco, and drug use.  Emotional well-being.  Home and relationship well-being.  Sexual activity.  Eating habits.  Work and work Statistician. What immunizations do I need?  Influenza (flu) vaccine  This is recommended every year. Tetanus, diphtheria, and pertussis (Tdap) vaccine  You may need a Td booster every 10 years. Varicella (chickenpox) vaccine  You may need this vaccine if you have not already been vaccinated. Zoster (shingles) vaccine  You may need this after age 56. Measles, mumps, and rubella (MMR) vaccine  You may need at least one dose of MMR if you were born in 1957 or later. You may also need a second dose. Pneumococcal conjugate (PCV13) vaccine  You may need this if you have  certain conditions and were not previously vaccinated. Pneumococcal polysaccharide (PPSV23) vaccine  You may need one or two doses if you smoke cigarettes or if you have certain conditions. Meningococcal conjugate (MenACWY) vaccine  You may need this if you have certain conditions. Hepatitis A vaccine  You may need this if you have certain conditions or if you travel or work in places where you may be exposed to hepatitis A. Hepatitis B vaccine  You may need this if you have certain conditions or if you travel or work in places where you may be exposed to hepatitis B. Haemophilus influenzae type b (Hib) vaccine  You may need this if you have certain risk factors. Human papillomavirus (HPV) vaccine  If recommended by your health care provider, you may need three doses over 6 months. You may receive vaccines as individual doses or as more than one vaccine together in one shot (combination vaccines). Talk with your health care provider about the risks and benefits of combination vaccines. What tests do I need? Blood tests  Lipid and cholesterol levels. These may be checked every 5 years, or more frequently if you are over 30 years old.  Hepatitis C test.  Hepatitis B test. Screening  Lung cancer screening. You may have this screening every year starting at age 56 if you have a 30-pack-year history of smoking and currently smoke or have quit within the past 15 years.  Prostate cancer screening. Recommendations will vary depending on your family history and other risks.  Colorectal cancer screening. All adults should have  this screening starting at age 56 and continuing until age 56. Your health care provider may recommend screening at age 56 if you are at increased risk. You will have tests every 1-10 years, depending on your results and the type of screening test.  Diabetes screening. This is done by checking your blood sugar (glucose) after you have not eaten for a while (fasting).  You may have this done every 1-3 years.  Sexually transmitted disease (STD) testing. Follow these instructions at home: Eating and drinking  Eat a diet that includes fresh fruits and vegetables, whole grains, lean protein, and low-fat dairy products.  Take vitamin and mineral supplements as recommended by your health care provider.  Do not drink alcohol if your health care provider tells you not to drink.  If you drink alcohol: ? Limit how much you have to 0-2 drinks a day. ? Be aware of how much alcohol is in your drink. In the [place] one drink equals one 12 oz bottle of beer (355 mL), one 5 oz glass of wine (148 mL), or one 1 oz glass of hard liquor (44 mL). Lifestyle  Take daily care of your teeth and gums.  Stay active. Exercise for at least 30 minutes on 5 or more days each week.  Do not use any products that contain nicotine or tobacco, such as cigarettes, e-cigarettes, and chewing tobacco. If you need help quitting, ask your health care provider.  If you are sexually active, practice safe sex. Use a condom or other form of protection to prevent STIs (sexually transmitted infections).  Talk with your health care provider about taking a low-dose aspirin every day starting at age 56. What's next?  Go to your health care provider once a year for a well check visit.  Ask your health care provider how often you should have your eyes and teeth checked.  Stay up to date on all vaccines. This information is not intended to replace advice given to you by your health care provider. Make sure you discuss any questions you have with your health care provider. Document Released: 02/26/2015 Document Revised: 01/24/2018 Document Reviewed: 01/24/2018 Elsevier Patient Education  2020 [employer].

## 2018-10-25 NOTE — Progress Notes (Signed)
Subjective:    Patient ID: Mark [name], male    DOB: 13-Nov-1960, 58 y.o.   MRN: [id number]  HPI  Pt in for cpe/wellness.  Pt ate banana today and black coffee.  Pt still exercising. Now he walks mostly and not runs due to parkinsons. Pt states diet is healthy. Nonsmoker. No alcohol. Only drinks 2 cups of coffee a day.  Pt will get flu vaccine.  He is very busy at work.  4 wks ago normal psa.   Review of Systems  Constitutional: Negative for chills, fatigue and fever.  HENT: Negative for congestion, drooling, facial swelling, hearing loss, postnasal drip, rhinorrhea, sinus pressure and sinus pain.   Respiratory: Negative for cough, choking, chest tightness, shortness of breath and wheezing.   Cardiovascular: Negative for chest pain and palpitations.  Gastrointestinal: Negative for abdominal distention and anal bleeding.  Genitourinary: Negative for dysuria, frequency, hematuria, scrotal swelling and urgency.  Musculoskeletal: Negative for back pain.  Skin: Negative for rash.  Neurological: Negative for dizziness, speech difficulty, weakness, numbness and headaches.       Anxiety is controlled with ativan use.  Hematological: Negative for adenopathy. Does not bruise/bleed easily.  Psychiatric/Behavioral: Negative for behavioral problems, confusion and sleep disturbance. The patient is not nervous/anxious.     Past Medical History:  Diagnosis Date  . Family history of prostate cancer     DRE WNL; PSA never > 0.38  . Fasting hyperglycemia     PMH of  . GERD (gastroesophageal reflux disease)   . Tinnitus    w/o hearing loss     Social History   Socioeconomic History  . Marital status: Married    Spouse name: Not on file  . Number of children: Not on file  . Years of education: Not on file  . Highest education level: Not on file  Occupational History  . Not on file  Social Needs  . Financial resource strain: Not on file  . Food insecurity    Worry: Not on  file    Inability: Not on file  . Transportation needs    Medical: Not on file    Non-medical: Not on file  Tobacco Use  . Smoking status: Never Smoker  . Smokeless tobacco: Never Used  Substance and Sexual Activity  . Alcohol use: Yes    Comment: very rare wine , < one per month per pt.  . Drug use: No  . Sexual activity: Yes  Lifestyle  . Physical activity    Days per week: Not on file    Minutes per session: Not on file  . Stress: Not on file  Relationships  . Social Herbalist on phone: Not on file    Gets together: Not on file    Attends religious service: Not on file    Active member of club or organization: Not on file    Attends meetings of clubs or organizations: Not on file    Relationship status: Not on file  . Intimate partner violence    Fear of current or ex partner: Not on file    Emotionally abused: Not on file    Physically abused: Not on file    Forced sexual activity: Not on file  Other Topics Concern  . Not on file  Social History Narrative  . Not on file    Past Surgical History:  Procedure Laterality Date  . COLONOSCOPY  05/2013   Neg, Dr Henrene Pastor  . KNEE SURGERY  Right   . TONSILLECTOMY AND ADENOIDECTOMY    . VASECTOMY      Family History  Problem Relation Age of Onset  . COPD Mother        smoker  . Prostate cancer Father [id number]       no longer with prostate cancer  . Breast cancer Sister   . COPD Paternal Aunt        smoker  . Stroke Maternal Grandfather         late 3160s  . Heart disease Neg Hx   . Colon cancer Neg Hx   . Esophageal cancer Neg Hx   . Pancreatic cancer Neg Hx   . Rectal cancer Neg Hx   . Stomach cancer Neg Hx   . Diabetes Neg Hx     Allergies  Allergen Reactions  . Penicillins     ? Reaction @ age 37    Current Outpatient Medications on File Prior to Visit  Medication Sig Dispense Refill  . LORazepam (ATIVAN) 0.5 MG tablet TAKE ONE TABLET BY MOUTH EVERY 8 HOURS AS NEEDED FOR ANXIETY 60 tablet 0  .  Multiple Vitamins-Minerals (MENS MULTIVITAMIN PLUS) TABS Take by mouth daily.    . rotigotine (NEUPRO) 6 MG/24HR Place 1 patch onto the skin daily. 30 patch 12  . sildenafil (VIAGRA) 100 MG tablet Take 1 tablet (100 mg total) by mouth daily as needed for erectile dysfunction. 10 tablet 0   No current facility-administered medications on file prior to visit.     BP 118/79   Pulse 69   Temp 98 F (36.7 C) (Temporal)   Resp 16   Ht 6' 0.5" (1.842 m)   Wt 175 lb 12.8 oz (79.7 kg)   SpO2 100%   BMI 23.52 kg/m       Objective:   Physical Exam   General Mental Status- Alert. General Appearance- Not in acute distress.   Skin General: Color- Normal Color. Moisture- Normal Moisture.  Neck Carotid Arteries- Normal color. Moisture- Normal Moisture. No carotid bruits. No JVD.  Chest and Lung Exam Auscultation: Breath Sounds:-Normal.  Cardiovascular Auscultation:Rythm- Regular. Murmurs & Other Heart Sounds:Auscultation of the heart reveals- No Murmurs.  Abdomen Inspection:-Inspeection Normal. Palpation/Percussion:Note:No mass. Palpation and Percussion of the abdomen reveal- Non Tender, Non Distended + BS, no rebound or guarding.  Neurologic Cranial Nerve exam:- CN III-XII intact(No nystagmus), symmetric smile. Strength:- 5/5 equal and symmetric strength both upper and lower extremities.  Rectal- deferred. Checked 4 weeks ago.      Assessment & Plan:  For you wellness exam today I have ordered cbc, cmp and lipid panel.  Flu vaccine today.  Recommend exercise and healthy diet.  We will let you know lab results as they come in.  Follow up date appointment will be determined after lab review.   Refill ativan when due. Up to date on contract and uds.   Esperanza RichtersEdward Shiah Berhow, PA-C

## 2018-10-28 ENCOUNTER — Telehealth: Payer: Self-pay | Admitting: Neurology

## 2018-10-28 MED ORDER — GABAPENTIN 100 MG PO CAPS: 100.0000 mg | ORAL_CAPSULE | Freq: Every evening | ORAL | 3 refills | Status: DC | PRN

## 2018-10-28 NOTE — Telephone Encounter (Signed)
Please call patient and advise him that I received his email regarding his restless leg symptoms.  Typically, a dopamine agonist such as the Neupro patch actually helps with restless leg symptoms.  Nevertheless, he may benefit from trying an adjunct medication called gabapentin, we can start low dose and he can use it as needed at bedtime, 100 mg strength.  Please advise him that the medication is potentially sedating and can make him sleepy, I would not recommend that he not drive after taking it, until he knows how it affects him. Rx sent to [facility].

## 2018-10-28 NOTE — Telephone Encounter (Signed)
I called pt to discuss, no answer, left a message asking him to call me back. Also sent a mychart message.

## 2018-11-05 ENCOUNTER — Other Ambulatory Visit: Payer: Self-pay | Admitting: Medical

## 2018-11-06 NOTE — Telephone Encounter (Addendum)
Refill Request: Lorazepam  Last RX: 10/09/18 Last OV: 10/25/18 Next OV: None scheduled  UDS:10/22/17 CSC:10/22/17 CSR:11/07/2018.  Refilled med today.

## 2018-11-07 ENCOUNTER — Encounter: Payer: Self-pay | Admitting: Medical

## 2018-11-20 ENCOUNTER — Encounter: Payer: Self-pay | Admitting: Neurology

## 2018-12-05 ENCOUNTER — Other Ambulatory Visit: Payer: Self-pay | Admitting: Medical

## 2018-12-05 DIAGNOSIS — Z79899 Other long term (current) drug therapy: Secondary | ICD-10-CM

## 2018-12-05 NOTE — Telephone Encounter (Signed)
Refill Request: Lorazepam  Last RX:11/07/18 Last OV:10/25/18 Next PQ:ZRAQ scheduled  UDS: 04/06/17 CSC:10-30-17 CSR:

## 2018-12-05 NOTE — Telephone Encounter (Signed)
Sent rx refill to pharmacy. But looks like overdue for controlled med contract. Will you get him scheduled to come in within a week for uds and update contract.

## 2018-12-09 NOTE — Telephone Encounter (Signed)
Pt will be in tomorrow.

## 2018-12-10 ENCOUNTER — Other Ambulatory Visit: Payer: Self-pay

## 2018-12-10 ENCOUNTER — Other Ambulatory Visit: Payer: 59

## 2018-12-10 DIAGNOSIS — Z79899 Other long term (current) drug therapy: Secondary | ICD-10-CM

## 2018-12-10 NOTE — Progress Notes (Signed)
Pt was to sign contract but no contract had been generated. Contract printed.

## 2018-12-13 LAB — PAIN MGMT, PROFILE 8 W/CONF, U
6 Acetylmorphine: NEGATIVE ng/mL
Alcohol Metabolites: NEGATIVE ng/mL (ref <500)
Alphahydroxyalprazolam: NEGATIVE ng/mL
Alphahydroxymidazolam: NEGATIVE ng/mL
Alphahydroxytriazolam: NEGATIVE ng/mL
Aminoclonazepam: NEGATIVE ng/mL
Amphetamines: NEGATIVE ng/mL
Benzodiazepines: POSITIVE ng/mL
Buprenorphine, Urine: NEGATIVE ng/mL
Buprenorphine: NEGATIVE ng/mL
Cocaine Metabolite: NEGATIVE ng/mL
Creatinine: 173.8 mg/dL
Hydroxyethylflurazepam: NEGATIVE ng/mL
Lorazepam: 580 ng/mL
MDMA: NEGATIVE ng/mL
Marijuana Metabolite: NEGATIVE ng/mL
Norbuprenorphine: NEGATIVE ng/mL
Nordiazepam: NEGATIVE ng/mL
Opiates: NEGATIVE ng/mL
Oxazepam: NEGATIVE ng/mL
Oxidant: NEGATIVE ug/mL
Oxycodone: NEGATIVE ng/mL
Temazepam: NEGATIVE ng/mL
pH: 5.9 (ref 4.5–9.0)

## 2019-01-02 ENCOUNTER — Other Ambulatory Visit: Payer: Self-pay | Admitting: Medical

## 2019-01-02 NOTE — Telephone Encounter (Addendum)
Refill Request: Lorazepam   Last RX:12/05/18 Last OV:10/25/18 Next OV: UDS:12/10/18 CSC:12/10/18 CSR:01/02/2019  Refilled lorazepam

## 2019-01-21 ENCOUNTER — Ambulatory Visit: Payer: 59 | Admitting: Neurology

## 2019-01-21 ENCOUNTER — Encounter: Payer: Self-pay | Admitting: Neurology

## 2019-01-21 ENCOUNTER — Other Ambulatory Visit: Payer: Self-pay

## 2019-01-21 VITALS — BP 125/81 | HR 73 | Ht 72.0 in | Wt 173.0 lb

## 2019-01-21 DIAGNOSIS — G20 Parkinson's disease: Secondary | ICD-10-CM

## 2019-01-21 DIAGNOSIS — M2569 Stiffness of other specified joint, not elsewhere classified: Secondary | ICD-10-CM

## 2019-01-21 MED ORDER — CYCLOBENZAPRINE HCL 10 MG PO TABS: 10.0000 mg | ORAL_TABLET | Freq: Every evening | ORAL | 3 refills | Status: DC | PRN

## 2019-01-21 MED ORDER — ROTIGOTINE 8 MG/24HR TD PT24: 8.0000 mg | MEDICATED_PATCH | Freq: Every day | TRANSDERMAL | 5 refills | Status: DC

## 2019-01-21 NOTE — Patient Instructions (Signed)
  As discussed, we will increase your Neupro patch to 8 mg every 24 hours for your next refill, you can start during your week off around Christmas. Please monitor for sleepiness and swelling in your legs. Also monitor for lightheadedness and nausea. You can continue with ibuprofen as needed, 600 to 800 mg, but use it with caution. For your back pain and stiffness, try Flexeril 10 mg strength at night as needed.  It can be sedating so Taking it during the day will be difficult for you.  Keep up the good work with your exercise and healthy lifestyle, stay well-hydrated, be proactive about constipation issues.Follow-up in 4 months routinely, keep [place] posted by email or phone call.

## 2019-01-21 NOTE — Progress Notes (Signed)
Subjective:    Patient ID: Mark [name] is a 58 y.o. male.  HPI    Interim history:   Mark [name] is a 58 year old right-handed gentleman with an underlying medical history of neck pain, reflux disease, chronic tinnitus, anxiety, and allergic rhinitis, who presents for follow-up consultation of his parkinsonism, likely left-sided predominant Parkinson's disease with akinetic-rigid presentation.  The patient is accompanied by his wife Mark [name] again today.  I last saw him on 10/01/2018, at which time he reported feeling better after starting his medication.  He also was not using his Ativan as much.  He was working full-time, very active overall, walking a lot.  He had noticed some intermittent sensory symptoms, nothing sustained.  He was advised to continue with the Neupro patch 6 mg strength.   He emailed in the interim with increase in restless leg symptoms, he was advised to try gabapentin in September 2020.  Today, 01/21/2019: He reports feeling better regarding his restless leg symptoms.  He has been using a calf massager which has helped and also taking melatonin on a nightly basis, 5 mg strength, has been helpful for his sleep and has made a big difference, his wife agrees.  Nevertheless, she has seen more drooling especially when he is tired, by the end of the day he is more stiff and slow and has a tendency to not pick up his left foot as well and sometimes catches the toes.  He has thankfully not fallen.  He quit taking the gabapentin because he did not see a big difference or benefit from it, no major side effects.  Sometimes he feels lightheaded when he is up and about.  He continues to be very active at work and has been having back stiffness and pain, in the mid back area.  He has been taking ibuprofen, 600 to 800 mg as needed, not daily but sometimes just about daily.    The patient's allergies, current medications, family history, past medical history, past social history, past surgical  history and problem list were reviewed and updated as appropriate.    Previously  I saw him on 07/02/2018 in virtual visit, at which time we mutually agreed to start him on Neupro patch.  We increased it in the recent past to 6 mg daily.  He emailed recently regarding some swelling he had noticed in the left upper and lower extremities.  He had an ultrasound of the left upper extremity which was negative for a blood clot.      I first met him on 04/03/2018 at the request of Dr. Barbaraann Barthel, at which time the patient reported a several month history, probably a 32-monthhistory of left hand tremor, neck stiffness, difficulty with fine motor control with the left upper extremity, and hand stiffness.  His history and physical examination were in keeping with parkinsonism on the left side.  We talked about symptomatic treatment options to be utilized soon, I suggested we proceed with a nuclear medicine DaTscan.  Due to the COVID-19 pandemic, his scan has not yet been done, he was approved for it though.   The patient sent a MyChart message in the interim in late March 2020 indicating that he has noticed more symptoms including some difficulty with his speech, including slurring, increased salivation, causing drooling at times and some symptoms in his right hand.     04/03/2018: (He) reports a left hand tremor for the past several months, likely 6-9 months. In addition, he has noted difficulty with  fine motor control, he has noticed neck stiffness and left hand stiffness and difficulty performing fine motor skills with the left hand. I reviewed your office note from 01/17/2018. He has had radiating neck pain to the left arm. He has had physical therapy for this. His neck pain has improved.  He fell in 2017 and had neck pain. He had a X ray neck on 03/26/17: IMPRESSION: No acute findings.   Mild spondylosis of the cervical spine with moderate to severe multilevel bilateral neural foraminal narrowing as  described.   He denies a family history of Parkinson's disease or tremor. He denies any recent falls. He denies any constipation. Mood is fairly stable but he does admit to having a longer standing history of anxiety. He takes Ativan as needed for this. He also reports claustrophobia and having had difficulty going through an MRI in the past.   He has had some issues with becoming slower in his mobility. His brother recently commented on this. He is the youngest of 4 siblings, he has an older brother, Mark [name] sister, then another brother. He lives with his family which includes his wife and 91 year old daughter and 66 year old son. He works as a Art gallery manager. He is a nonsmoker and drinks alcohol in the form of wine, maybe 3 glasses per week on average, caffeine in the form of coffee, 3 cups per day on average. He tries to hydrate well. He exercises regularly.  His Past Medical History Is Significant For: Past Medical History:  Diagnosis Date  . Family history of prostate cancer     DRE WNL; PSA never > 0.38  . Fasting hyperglycemia     PMH of  . GERD (gastroesophageal reflux disease)   . Tinnitus    w/o hearing loss    His Past Surgical History Is Significant For: Past Surgical History:  Procedure Laterality Date  . COLONOSCOPY  05/2013   Neg, Dr Henrene Pastor  . KNEE SURGERY Right   . TONSILLECTOMY AND ADENOIDECTOMY    . VASECTOMY      His Family History Is Significant For: Family History  Problem Relation Age of Onset  . COPD Mother        smoker  . Prostate cancer Father 9       no longer with prostate cancer  . Breast cancer Sister   . COPD Paternal Aunt        smoker  . Stroke Maternal Grandfather         late 26s  . Heart disease Neg Hx   . Colon cancer Neg Hx   . Esophageal cancer Neg Hx   . Pancreatic cancer Neg Hx   . Rectal cancer Neg Hx   . Stomach cancer Neg Hx   . Diabetes Neg Hx     His Social History Is Significant For: Social History   Socioeconomic  History  . Marital status: Married    Spouse name: Not on file  . Number of children: Not on file  . Years of education: Not on file  . Highest education level: Not on file  Occupational History  . Not on file  Social Needs  . Financial resource strain: Not on file  . Food insecurity    Worry: Not on file    Inability: Not on file  . Transportation needs    Medical: Not on file    Non-medical: Not on file  Tobacco Use  . Smoking status: Never Smoker  . Smokeless tobacco: Never Used  Substance and Sexual Activity  . Alcohol use: Yes    Comment: very rare wine , < one per month per pt.  . Drug use: No  . Sexual activity: Yes  Lifestyle  . Physical activity    Days per week: Not on file    Minutes per session: Not on file  . Stress: Not on file  Relationships  . Social Herbalist on phone: Not on file    Gets together: Not on file    Attends religious service: Not on file    Active member of club or organization: Not on file    Attends meetings of clubs or organizations: Not on file    Relationship status: Not on file  Other Topics Concern  . Not on file  Social History Narrative  . Not on file    His Allergies Are:  Allergies  Allergen Reactions  . Penicillins     ? Reaction @ age 81  :   His Current Medications Are:  Outpatient Encounter Medications as of 01/21/2019  Medication Sig  . LORazepam (ATIVAN) 0.5 MG tablet TAKE ONE TABLET BY MOUTH EVERY 8 HOURS AS NEEDED FOR ANXIETY  . Multiple Vitamins-Minerals (MENS MULTIVITAMIN PLUS) TABS Take by mouth daily.  . rotigotine (NEUPRO) 6 MG/24HR Place 1 patch onto the skin daily.  . sildenafil (VIAGRA) 100 MG tablet Take 1 tablet (100 mg total) by mouth daily as needed for erectile dysfunction.  . [DISCONTINUED] gabapentin (NEURONTIN) 100 MG capsule Take 1 capsule (100 mg total) by mouth at bedtime as needed.   No facility-administered encounter medications on file as of 01/21/2019.   :  Review of Systems:   Out of a complete 14 point review of systems, all are reviewed and negative with the exception of these symptoms as listed below: Review of Systems  Neurological:       Pt presents today for follow up. Pt stopped the gabapentin. Pt is doing well.    Objective:  Neurological Exam  Physical Exam Physical Examination:   Vitals:   01/21/19 0835  BP: 125/81  Pulse: 73    General Examination: The patient is a very pleasant 58 y.o. male in no acute distress. He appears well-developed and well-nourished and well groomed. Good spirits.   HEENT:Normocephalic, atraumatic, pupils are equal, round and reactive to light and accommodation. He has mild to moderate facial masking and decreased eye blink rate. Hearing is grossly intact. Face is otherwise symmetric, no dysarthria, minimal hypophonia potentially. He has moderate nuchal rigidity. No carotid bruits. Airway examination reveals mild mouth dryness, otherwise tongue protrudes centrally and palate elevates symmetrically, and no lip, neck or jaw tremor. No sialorrhea.  Chest:Clear to auscultation without wheezing, rhonchi or crackles noted.  Heart:S1+S2+0, regular and normal without murmurs, rubs or gallops noted.   Abdomen:Soft, non-tender and non-distended with normal bowel sounds appreciated on auscultation.  Extremities:There isnopitting edema in the distal lower extremities bilaterally.   Skin: Warm and dry without trophic changes noted.  Musculoskeletal: exam reveals no obvious joint deformities, tenderness or joint swelling or erythema.   Neurologically:  Mental status: The patient is awake, alert and oriented in all 4 spheres.Hisimmediate and remote memory, attention, language skills and fund of knowledge are appropriate. There is no evidence of aphasia, agnosia, apraxia or anomia. Speech is clear with normal prosody and enunciation. Thought process is linear. Mood is normaland affect is normal.  (On2/19/2020:  on Archimedes spiral drawing he has no significant trembling with  either hand, handwriting with the right hand is legible, not tremulous, slightly micrographic.)  He has no obvious resting tremor, no significant postural or action tremor. Cranial nerves II - XII are as described above under HEENT exam. In addition: shoulder shrug is normal with equal shoulder height noted.At baseline, mildly elevated right shoulder noted. Unchanged.  Motor exam: Normal bulk,andstrength, tone is increased in the left upper extremity with mild cogwheeling noted, slight increase in tone in the right upper extremity noted. Otherwise tone is normal. He has no obvious resting tremor. Fine motor skills are moderately impaired with finger taps and hand movements of the left upper extremity, otherwise benign in the upper extremities, foot taps are mildly impaired minimally on the left and normal on the right. Cerebellar testing: No dysmetria or intention tremor on finger to nose testing. There is no truncal or gait ataxia.  Sensory exam: intact to light touch in the upper and lower extremities.  Gait, station and balance:Hestands easily. No veering to one side is noted. No leaning to one side is noted. Posture isFairly age-appropriate. Stance is narrow based. Gait shows fairly good pace and stride length but near-absence of arm swing on the left. Balance appears preserved.  Assessmentand plan:   In summary,Mark Garneris a very pleasant 76 year oldmalewith an underlying medical history of neck pain, reflux disease, chronic tinnitus, anxiety, and allergic rhinitis, whopresents for FU consultation of his parkinsonism, likely L sided Parkinson's disease. His DaT scan from 08/07/18 showed "Near absent activity in the putamen and asymmetric decreased activity in the head of the RIGHT caudate nucleus is a pattern suggestive of Parkinson's syndrome pathology." He has been on Neupro since May 2020. He has noted  Improvement in his dexterity and slowness, But has ongoing issues towards the end of the day.  He still is very active at work, walking a lot, has noticed some back pain and stiffness in the mid back area, has been taking ibuprofen as needed.  I suggested cautious trial of Flexeril at night, he tends to be sensitive to medication side effects so I suggested only taking it at night if needed.  He is cautioned with regards to taking ibuprofen higher dose on a daily basis.  He tries to take it as needed, 600 to 800 mg each time.  We mutually agreed to increase the Neupro patch to the next dose, 8 mg strength.  He has had some residual problem with stiffness and also difficulty feeding himself at times.  This has been noted by his family.  He has noticed occasional mild swelling in the left leg but it is not noticeable today.  He is advised to monitor for swelling, lightheadedness, nausea and sleepiness when increasing the Neupro. He is encouraged to keep [place] posted by phone call or email as to how its going with the Flexeril trial.  He can start the Flexeril now while he is still on the 6 mg patch.  He has a week off around Christmas and can try the higher dose of Neupro at the time. He is advised to follow-up routinely in 4 months, sooner if needed.  I answered all their questions today and the patient and his wife were in agreement. I spent 25 minutes in total face-to-face time with the patient, more than 50% of which was spent in counseling and coordination of care, reviewing test results, reviewing medication and discussing or reviewing the diagnosis of PD, its prognosis and treatment options. Pertinent laboratory and imaging test results that  were available during this visit with the patient were reviewed by me and considered in my medical decision making (see chart for details).

## 2019-01-30 ENCOUNTER — Other Ambulatory Visit: Payer: Self-pay | Admitting: Medical

## 2019-01-30 MED ORDER — LORAZEPAM 0.5 MG PO TABS: 0.5000 mg | ORAL_TABLET | Freq: Three times a day (TID) | ORAL | 0 refills | Status: DC | PRN

## 2019-01-30 NOTE — Telephone Encounter (Signed)
Rx refill  of ativan sent to pharmacy.

## 2019-02-24 ENCOUNTER — Encounter: Payer: Self-pay | Admitting: Medical

## 2019-02-27 ENCOUNTER — Encounter: Payer: Self-pay | Admitting: Neurology

## 2019-02-28 ENCOUNTER — Other Ambulatory Visit: Payer: Self-pay | Admitting: Medical

## 2019-02-28 NOTE — Telephone Encounter (Addendum)
Requesting:Lorazepam Contract:12/10/18 UDS:12/10/18 Last OV:10/25/18 Next OV:n/a  Last Refill:01/30/19 Database: No meds seen on controlled med site. Will you call pt pharmacy and let them know I checked datat base and no hx of any controlled meds prescribed?? Do they have answer.    Please advise   Went ahead and sent in pr refill of lorazepam.

## 2019-03-30 ENCOUNTER — Other Ambulatory Visit: Payer: Self-pay | Admitting: Medical

## 2019-03-31 NOTE — Telephone Encounter (Addendum)
Requesting: Ativan Contract:01/15/2019 UDS:12/10/2018 Last Visit:01/21/19 Next Visit: none Last Refill:03/01/2019  Please Advise  Refill of ativan sent to pt pharmacy.

## 2019-04-14 ENCOUNTER — Encounter: Payer: Self-pay | Admitting: Medical

## 2019-04-18 ENCOUNTER — Other Ambulatory Visit: Payer: Self-pay

## 2019-04-18 ENCOUNTER — Ambulatory Visit (INDEPENDENT_AMBULATORY_CARE_PROVIDER_SITE_OTHER): Payer: 59 | Admitting: Medical

## 2019-04-18 DIAGNOSIS — Z125 Encounter for screening for malignant neoplasm of prostate: Secondary | ICD-10-CM

## 2019-04-18 DIAGNOSIS — R5383 Other fatigue: Secondary | ICD-10-CM | POA: Diagnosis not present

## 2019-04-18 DIAGNOSIS — N529 Male erectile dysfunction, unspecified: Secondary | ICD-10-CM

## 2019-04-18 DIAGNOSIS — R6882 Decreased libido: Secondary | ICD-10-CM | POA: Diagnosis not present

## 2019-04-18 MED ORDER — TADALAFIL 20 MG PO TABS: 10.0000 mg | ORAL_TABLET | ORAL | 11 refills | Status: DC | PRN

## 2019-04-18 NOTE — Progress Notes (Signed)
   Subjective:    Patient ID: Mark [name], male    DOB: June 03, 1960, 59 y.o.   MRN: [id number]  HPI  Virtual Visit via Video Note  I connected with Megan Mans on 04/18/19 at  1:40 PM EST by a video enabled telemedicine application and verified that I am speaking with the correct person using two identifiers.  Location: Patient: car Provider: home   I discussed the limitations of evaluation and management by telemedicine and the availability of in person appointments. The patient expressed understanding and agreed to proceed.  History of Present Illness:  Pt has follow up.   Pt has some history of erectile dysfunction in the past. He states medicine used to work and for a while it did work. He states last time he used he bluish haze after use of med. But he also just states that it just did not work.  Pt states blue haze last for only 30-45 minutes. Normal vision now.  Pt states some fatigue. He does have parkinson.  Pt has libido is decreased overall.    Observations/Objective: General-no acute distress, pleasant, oriented. Lungs- on inspection lungs appear unlabored. Neck- no tracheal deviation or jvd on inspection. Neuro- gross motor function appears intact.   Assessment and Plan: For recent low libido, ED, and fatigue please get below listed labs.   DC viagra and rx cialis. Rx advisement given.   If testosterone low refer to endocrinologist.  Follow psa.  Follow up date to be determined after lab review. Sooner if needed  [employer], PA-C  Follow Up Instructions:    I discussed the assessment and treatment plan with the patient. The patient was provided an opportunity to ask questions and all were answered. The patient agreed with the plan and demonstrated an understanding of the instructions.   The patient was advised to call back or seek an in-person evaluation if the symptoms worsen or if the condition fails to improve as anticipated.  I  provided  of non-face-to-face time during this encounter.   Esperanza Richters, PA-C   Review of Systems     Objective:   Physical Exam        Assessment & Plan:

## 2019-04-18 NOTE — Patient Instructions (Signed)
For recent low libido, ED, and fatigue please get below listed labs.   DC viagra and rx cialis. Rx advisement given.   If testosterone low refer to endocrinologist.  Follow psa.  Follow up date to be determined after lab review. Sooner if needed

## 2019-04-24 ENCOUNTER — Other Ambulatory Visit: Payer: Self-pay | Admitting: Medical

## 2019-04-24 ENCOUNTER — Other Ambulatory Visit: Payer: 59

## 2019-04-24 NOTE — Telephone Encounter (Addendum)
Requesting:ativan  Contract:yes UDS:low risk  Last OV:04/18/19 Next OV: Last Refill:03/31/19  #[id number] Database:   Please advise   I am filling today. Would you help out and call pt pharmacy. Would you ask them why the controlled med site never shows any controlled meds? Would you ask them to run check and make sure nothing looks irregular. I don't suspect anything but each month I go to the site nothing every shows??

## 2019-04-29 ENCOUNTER — Other Ambulatory Visit (INDEPENDENT_AMBULATORY_CARE_PROVIDER_SITE_OTHER): Payer: 59

## 2019-04-29 ENCOUNTER — Other Ambulatory Visit: Payer: Self-pay

## 2019-04-29 DIAGNOSIS — Z125 Encounter for screening for malignant neoplasm of prostate: Secondary | ICD-10-CM

## 2019-04-29 DIAGNOSIS — R5383 Other fatigue: Secondary | ICD-10-CM | POA: Diagnosis not present

## 2019-04-29 DIAGNOSIS — R6882 Decreased libido: Secondary | ICD-10-CM

## 2019-04-29 DIAGNOSIS — N529 Male erectile dysfunction, unspecified: Secondary | ICD-10-CM

## 2019-04-29 LAB — COMPREHENSIVE METABOLIC PANEL
ALT: 35 U/L (ref 0–53)
AST: 39 U/L — ABNORMAL HIGH (ref 0–37)
Albumin: 4.2 g/dL (ref 3.5–5.2)
Alkaline Phosphatase: 70 U/L (ref 39–117)
BUN: 10 mg/dL (ref 6–23)
CO2: 29 mEq/L (ref 19–32)
Calcium: 9.3 mg/dL (ref 8.4–10.5)
Chloride: 100 mEq/L (ref 96–112)
Creatinine, Ser: 0.89 mg/dL (ref 0.40–1.50)
GFR: 88.29 mL/min (ref >60.00)
Glucose, Bld: 99 mg/dL (ref 70–99)
Potassium: 3.8 mEq/L (ref 3.5–5.1)
Sodium: 136 mEq/L (ref 135–145)
Total Bilirubin: 0.8 mg/dL (ref 0.2–1.2)
Total Protein: 7 g/dL (ref 6.0–8.3)

## 2019-04-29 LAB — CBC WITH DIFFERENTIAL/PLATELET
Basophils Absolute: 0 10*3/uL (ref 0.0–0.1)
Basophils Relative: 0.6 % (ref 0.0–3.0)
Eosinophils Absolute: 0.1 10*3/uL (ref 0.0–0.7)
Eosinophils Relative: 1.5 % (ref 0.0–5.0)
HCT: 40.4 % (ref 39.0–52.0)
Hemoglobin: 13.8 g/dL (ref 13.0–17.0)
Lymphocytes Relative: 22.8 % (ref 12.0–46.0)
Lymphs Abs: 1.2 10*3/uL (ref 0.7–4.0)
MCHC: 34.1 g/dL (ref 30.0–36.0)
MCV: 92.7 fl (ref 78.0–100.0)
Monocytes Absolute: 0.5 10*3/uL (ref 0.1–1.0)
Monocytes Relative: 9.4 % (ref 3.0–12.0)
Neutro Abs: 3.3 10*3/uL (ref 1.4–7.7)
Neutrophils Relative %: 65.7 % (ref 43.0–77.0)
Platelets: 186 10*3/uL (ref 150.0–400.0)
RBC: 4.36 Mil/uL (ref 4.22–5.81)
RDW: 11.8 % (ref 11.5–15.5)
WBC: 5.1 10*3/uL (ref 4.0–10.5)

## 2019-04-29 LAB — TSH: TSH: 1.65 u[IU]/mL (ref 0.35–4.50)

## 2019-04-29 LAB — PSA: PSA: 0.55 ng/mL (ref 0.10–4.00)

## 2019-04-30 ENCOUNTER — Encounter: Payer: Self-pay | Admitting: Neurology

## 2019-04-30 ENCOUNTER — Other Ambulatory Visit: Payer: Self-pay

## 2019-04-30 ENCOUNTER — Encounter: Payer: Self-pay | Admitting: Medical

## 2019-04-30 ENCOUNTER — Telehealth: Payer: Self-pay | Admitting: Medical

## 2019-04-30 DIAGNOSIS — R7989 Other specified abnormal findings of blood chemistry: Secondary | ICD-10-CM

## 2019-04-30 LAB — TESTOSTERONE TOTAL,FREE,BIO, MALES
Albumin: 4.2 g/dL (ref 3.6–5.1)
Sex Hormone Binding: 72 nmol/L (ref 22–77)
Testosterone, Bioavailable: 63.9 ng/dL — ABNORMAL LOW (ref >=110.0)
Testosterone, Free: 33.2 pg/mL — ABNORMAL LOW (ref 46.0–224.0)
Testosterone: 492 ng/dL (ref 250–827)

## 2019-04-30 LAB — T4: T4, Total: 11.5 ug/dL — ABNORMAL HIGH (ref 4.9–10.5)

## 2019-04-30 NOTE — Telephone Encounter (Signed)
Future thyroid studies placed. 

## 2019-05-01 ENCOUNTER — Telehealth: Payer: Self-pay | Admitting: Neurology

## 2019-05-01 ENCOUNTER — Encounter: Payer: Self-pay | Admitting: Neurology

## 2019-05-01 ENCOUNTER — Ambulatory Visit: Payer: 59 | Admitting: Medical

## 2019-05-01 ENCOUNTER — Other Ambulatory Visit: Payer: Self-pay

## 2019-05-01 VITALS — BP 123/78 | HR 89 | Temp 96.3°F | Ht 73.0 in | Wt 173.6 lb

## 2019-05-01 DIAGNOSIS — R7989 Other specified abnormal findings of blood chemistry: Secondary | ICD-10-CM | POA: Diagnosis not present

## 2019-05-01 DIAGNOSIS — R42 Dizziness and giddiness: Secondary | ICD-10-CM | POA: Diagnosis not present

## 2019-05-01 LAB — TSH: TSH: 1.35 u[IU]/mL (ref 0.35–4.50)

## 2019-05-01 LAB — T4, FREE: Free T4: 0.93 ng/dL (ref 0.60–1.60)

## 2019-05-01 NOTE — Progress Notes (Signed)
Subjective:    Patient ID: Mark [name], male    DOB: 02/24/1960, 59 y.o.   MRN: [id number]  HPI  Pt in for evaluation.  Pt had some dizziness about Monday a week ago. 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 He was looking up to grab something on  shelf and felt very dizzy. He felt dizzy for a good hour. Room was not spinning/vertigo. He states he almost felt like was about to pass out. He sat down and felt like heart was racing briefly due to anxiety about dizziness.   Then on Saturday morning he went on run and started to light headed again. He states was mild. Lasted for about 1/2 hour.   Does describe vague lightheaded at times as well.   Recent tsh normal but t4 was mild elevated.  Pt testosterone level was low.   Pt has parkinson and has been on Rotigotine patch for months. Has been on 8 mg patch dose for 2 months.   Pt states yesterday bp was not elevated.   Pt wonders if stress can cause symptoms. He notes light headed mostly at work. He sleeps well and eats well. 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 He moves slower and if gets behind and work starts to feel anxious.   Pt is taking ativan either once or twice a day.    Review of Systems  Constitutional: Negative for chills, fatigue and fever.  HENT: Negative for congestion and drooling.   Respiratory: Negative for cough, chest tightness, shortness of breath and wheezing.   Cardiovascular: Negative for chest pain and palpitations.  Gastrointestinal: Negative for abdominal pain.  Genitourinary: Negative for dysuria.  Musculoskeletal: Negative for back pain.  Neurological: Positive for dizziness. Negative for speech difficulty, weakness and headaches.       Neither of times had ha.  Hematological: Negative for adenopathy. Does not bruise/bleed easily.  Psychiatric/Behavioral: Negative for agitation, sleep disturbance and suicidal ideas. The patient is nervous/anxious.     Past Medical History:   Diagnosis Date  . Family history of prostate cancer     DRE WNL; PSA never > 0.38  . Fasting hyperglycemia     PMH of  . GERD (gastroesophageal reflux disease)   . Tinnitus    w/o hearing loss     Social History   Socioeconomic History  . Marital status: Married    Spouse name: Not on file  . Number of children: Not on file  . Years of education: Not on file  . Highest education level: Not on file  Occupational History  . Not on file  Tobacco Use  . Smoking status: Never Smoker  . Smokeless tobacco: Never Used  Substance and Sexual Activity  . Alcohol use: Yes    Comment: very rare wine , < one per month per pt.  . Drug use: No  . Sexual activity: Yes  Other Topics Concern  . Not on file  Social History Narrative  . Not on file   Social Determinants of Health   Financial Resource Strain:   . Difficulty of Paying Living Expenses:   Food Insecurity:   . Worried About Programme researcher, broadcasting/film/video in the Last Year:   . Barista in the Last Year:   Transportation Needs:   . Freight forwarder (Medical):   Marland Kitchen Lack of Transportation (Non-Medical):   Physical Activity:   . Days of Exercise per Week:   . Minutes  of Exercise per Session:   Stress:   . Feeling of Stress :   Social Connections:   . Frequency of Communication with Friends and Family:   . Frequency of Social Gatherings with Friends and Family:   . Attends Religious Services:   . Active Member of Clubs or Organizations:   . Attends Banker Meetings:   Marland Kitchen Marital Status:   Intimate Partner Violence:   . Fear of Current or Ex-Partner:   . Emotionally Abused:   Marland Kitchen Physically Abused:   . Sexually Abused:     Past Surgical History:  Procedure Laterality Date  . COLONOSCOPY  05/2013   Neg, Dr Marina Goodell  . KNEE SURGERY Right   . TONSILLECTOMY AND ADENOIDECTOMY    . VASECTOMY      Family History  Problem Relation Age of Onset  . COPD Mother        smoker  . Prostate cancer Father 26        no longer with prostate cancer  . Breast cancer Sister   . COPD Paternal Aunt        smoker  . Stroke Maternal Grandfather         late 56s  . Heart disease Neg Hx   . Colon cancer Neg Hx   . Esophageal cancer Neg Hx   . Pancreatic cancer Neg Hx   . Rectal cancer Neg Hx   . Stomach cancer Neg Hx   . Diabetes Neg Hx     Allergies  Allergen Reactions  . Penicillins     ? Reaction @ age 36    Current Outpatient Medications on File Prior to Visit  Medication Sig Dispense Refill  . cyclobenzaprine (FLEXERIL) 10 MG tablet Take 1 tablet (10 mg total) by mouth at bedtime as needed for muscle spasms. (Patient not taking: Reported on 04/18/2019) 30 tablet 3  . LORazepam (ATIVAN) 0.5 MG tablet TAKE ONE TABLET BY MOUTH EVERY 8 HOURS AS NEEDED FOR ANXIETY 60 tablet 0  . Multiple Vitamins-Minerals (MENS MULTIVITAMIN PLUS) TABS Take by mouth daily.    . Rotigotine 8 MG/24HR PT24 Place 8 mg onto the skin daily. Change from 6 mg patch. 30 patch 5  . sildenafil (VIAGRA) 100 MG tablet Take 1 tablet (100 mg total) by mouth daily as needed for erectile dysfunction. 10 tablet 0  . tadalafil (CIALIS) 20 MG tablet Take 0.5-1 tablets (10-20 mg total) by mouth every other day as needed for erectile dysfunction. 5 tablet 11   No current facility-administered medications on file prior to visit.    BP 123/78 (BP Location: Right Arm, Patient Position: Sitting, Cuff Size: Large)   Pulse 89   Temp (!) 96.3 F (35.7 C) (Temporal)   Ht 6\' 1"  (1.854 m)   Wt 173 lb 9.6 oz (78.7 kg)   SpO2 95%   BMI 22.90 kg/m       Objective:   Physical Exam  General Mental Status- Alert. General Appearance- Not in acute distress.   Skin General: Color- Normal Color. Moisture- Normal Moisture.  Neck Carotid Arteries- Normal color. Moisture- Normal Moisture. No carotid bruits. No JVD.  Chest and Lung Exam Auscultation: Breath Sounds:-Normal.  Cardiovascular Auscultation:Rythm- Regular. Murmurs & Other Heart  Sounds:Auscultation of the heart reveals- No Murmurs.  Abdomen Inspection:-Inspeection Normal. Palpation/Percussion:Note:No mass. Palpation and Percussion of the abdomen reveal- Non Tender, Non Distended + BS, no rebound or guarding.    Neurologic Cranial Nerve exam:- CN III-XII intact(No nystagmus), symmetric smile.  Drift Test:- No drift. Romberg Exam:- Negative.  Heal to Toe Gait exam:-Normal. Finger to Nose:- Normal/Intact Strength:- 5/5 equal and symmetric strength both upper and lower extremities.      Assessment & Plan:  You had 2 episodes of dizziness/lightheadedness over the last approximate 2 weeks.  None presently any have good neurologic exam.  If you have recurrent events would like to know what your blood pressure and pulses at the time of the event.  If you have any motor or sensory function deficits with dizziness then be seen at the emergency department.  Presently I do recommend that you cut back your work hours to only 40 hours max per week/8 hours/day.  He did associate slight lightheaded sensation with your work.  Recent blood work done 2 days ago did not reveal any obvious cause for dizziness.  However T4 was little bit elevated so we will repeat TSH and T4 today.  EKG today shows sinus rhythm.  Very similar to prior EKG in 2014.  State well-hydrated and eat balanced diet.  Regarding any future dizziness/lightheaded events please make note if you are having any preceding cardiac symptoms.  If so then would refer you to cardiologist for Holter monitor.  We discussed your Parkinson's medication and you mention dizziness was side effect per packaging.  So would touch base with neurologist office to see if they would cut your dose back down to 6 mg.  For recent low testosterone level will refer you to endocrinologist.  We will see if there except referral.  Follow-up in 10 days or as needed  40 minutes spent with patient today.  Time spent counseling regarding  differential diagnosis of dizziness and the plan going forward.  Mackie Pai, PA-C

## 2019-05-01 NOTE — Telephone Encounter (Signed)
Please call patient regarding his MyChart message about the Neupro patch, if he is okay, we can scale back and reduce the Neupro patch back to 6 mg.  If he is agreeable, please go ahead and change the prescription from 8 mg patch to 6 mg patch and fax to pharmacy of choice.

## 2019-05-01 NOTE — Addendum Note (Signed)
Addended by: Harley Alto on: 05/01/2019 09:50 AM   Modules accepted: Orders

## 2019-05-01 NOTE — Telephone Encounter (Signed)
I called pt to discuss. No answer, left a message asking him to call me back. 

## 2019-05-01 NOTE — Patient Instructions (Signed)
You had 2 episodes of dizziness/lightheadedness over the last approximate 2 weeks.  None presently any have good neurologic exam.  If you have recurrent events would like to know what your blood pressure and pulses at the time of the event.  If you have any motor or sensory function deficits with dizziness then be seen at the emergency department.  Presently I do recommend that you cut back your work hours to only 40 hours max per week/8 hours/day.  He did associate slight lightheaded sensation with your work.  Recent blood work done 2 days ago did not reveal any obvious cause for dizziness.  However T4 was little bit elevated so we will repeat TSH and T4 today.  EKG today shows sinus rhythm.  Very similar to prior EKG in 2014.  State well-hydrated and eat balanced diet.  Regarding any future dizziness/lightheaded events please make note if you are having any preceding cardiac symptoms.  If so then would refer you to cardiologist for Holter monitor.  We discussed your Parkinson's medication and you mention dizziness was side effect per packaging.  So would touch base with neurologist office to see if they would cut your dose back down to 6 mg.  For recent low testosterone level will refer you to endocrinologist.  We will see if there except referral.  Follow-up in 10 days or as needed

## 2019-05-05 MED ORDER — ROTIGOTINE 6 MG/24HR TD PT24: 1.0000 | MEDICATED_PATCH | Freq: Every day | TRANSDERMAL | 12 refills | Status: DC

## 2019-05-05 NOTE — Telephone Encounter (Signed)
I called pt again to discuss. No answer, left a message asking him to call me back. I will also try a mychart message instead of calling him.

## 2019-05-05 NOTE — Telephone Encounter (Signed)
Responded pt via mychart

## 2019-05-05 NOTE — Addendum Note (Signed)
Addended by: Geronimo Running A on: 05/05/2019 09:13 AM   Modules accepted: Orders

## 2019-05-21 ENCOUNTER — Other Ambulatory Visit: Payer: Self-pay | Admitting: Medical

## 2019-05-22 ENCOUNTER — Encounter: Payer: Self-pay | Admitting: Neurology

## 2019-05-22 ENCOUNTER — Other Ambulatory Visit: Payer: Self-pay

## 2019-05-22 ENCOUNTER — Ambulatory Visit: Payer: 59 | Admitting: Neurology

## 2019-05-22 VITALS — BP 133/88 | HR 90 | Ht 73.0 in | Wt 178.0 lb

## 2019-05-22 DIAGNOSIS — G20 Parkinson's disease: Secondary | ICD-10-CM | POA: Diagnosis not present

## 2019-05-22 MED ORDER — ROTIGOTINE 6 MG/24HR TD PT24: 1.0000 | MEDICATED_PATCH | Freq: Every day | TRANSDERMAL | 3 refills | Status: DC

## 2019-05-22 NOTE — Patient Instructions (Addendum)
I would like to do a brain scan at some point, called MRI. Let me know, if you decide to proceed. This test requires authorization from your insurance, and we will take care of the insurance process.   Your can consider taking over-the-counter coenzyme Q 10 400 mg 3 times daily.  There was a study in the past that suggested that high-dose coenzyme every day could be favorable in Parkinson's disease patients to potentially slow down the progression.  For your ED (erectile dysfunction), please take to your primary care PA about a referral to urology.  It is not uncommon to have a ED with Parkinson's disease.  I would favor that you see a specialist for this.  As discussed, you can continue with Neupro patch 6 mg daily.  I changed your prescription to a 90-day supply with refills.  Please follow-up in 6 months.

## 2019-05-22 NOTE — Progress Notes (Signed)
Subjective:    Patient ID: Mark [name] is a 59 y.o. male.  HPI     Interim history:   Mark [name] is a 59 year old right-handed gentleman with an underlying medical history of neck pain, reflux disease, chronic tinnitus, anxiety, and allergic rhinitis, who presents for follow-up consultation of his parkinsonism, likely left-sided predominant Parkinson's disease with akinetic-rigid presentation.  The patient is accompanied by his wife Mark [name] again today.  I last saw him on 01/21/2019, at which time he reported feeling better with regards to his restless leg symptoms.  He was taking melatonin at night, 5 mg strength.  He was noticing more stiffness and slowness.  I suggested we increase his Neupro to 8 mg daily.  He also reported back stiffness and back pain in the mid back area, was taking ibuprofen as needed and I suggested a trial of Flexeril.  He emailed in the interim in January 2021, reporting some bouts of constipation.  He and his wife also emailed through [employer] last month reporting that he felt dizzy and lightheaded.  Eventually, we decided to reduce his Neupro patch back to 6 mg daily.    Today, 05/22/2019: He reports doing better with regards to the dizziness.  He is on 6 mg Neupro.  Sometimes his blood pressure trend is higher.  His anxiety flares up from time to time.  His back stiffness is stable, Flexeril as needed helps.  He is tired in the early afternoons more quicker.  His work hours along, he works 49 to 48 hours a week.  He also exercises regularly.  His wife is worried that he tires himself out.  He tries to hydrate well with water, constipation is under control with daily probiotic supplement.  He is willing to think about an MRI.  He has had some tingling on the left side and also cold sensation in the left arm and hand from time to time.  He has never had any sudden onset of one-sided weakness or numbness.  No droopy face, no falls, memory and mood are stable otherwise.  Sleep is  stable, takes melatonin at night.  His wife would like to discuss his ED.  He has tried Viagra but had side effects and was given a prescription for Cialis by his primary care but has not taken it yet.  He would be willing to see a urologist.  He has not talked to his primary care about a referral but would be willing.    The patient's allergies, current medications, family history, past medical history, past social history, past surgical history and problem list were reviewed and updated as appropriate.    Previously   I saw him on 10/01/2018, at which time he reported feeling better after starting his medication.  He also was not using his Ativan as much.  He was working full-time, very active overall, walking a lot.  He had noticed some intermittent sensory symptoms, nothing sustained.  He was advised to continue with the Neupro patch 6 mg strength.    He emailed in the interim with increase in restless leg symptoms, he was advised to try gabapentin in September 2020.     I saw him on 07/02/2018 in virtual visit, at which time we mutually agreed to start him on Neupro patch.  We increased it in the recent past to 6 mg daily.  He emailed recently regarding some swelling he had noticed in the left upper and lower extremities.  He had an ultrasound of  the left upper extremity which was negative for a blood clot.       I first met him on 04/03/2018 at the request of Dr. Barbaraann Barthel, at which time the patient reported a several month history, probably a 87-monthhistory of left hand tremor, neck stiffness, difficulty with fine motor control with the left upper extremity, and hand stiffness.  His history and physical examination were in keeping with parkinsonism on the left side.  We talked about symptomatic treatment options to be utilized soon, I suggested we proceed with a nuclear medicine DaTscan.  Due to the COVID-19 pandemic, his scan has not yet been done, he was approved for it though.   The patient  sent a MyChart message in the interim in late March 2020 indicating that he has noticed more symptoms including some difficulty with his speech, including slurring, increased salivation, causing drooling at times and some symptoms in his right hand.     04/03/2018: (He) reports a left hand tremor for the past several months, likely 6-9 months. In addition, he has noted difficulty with fine motor control, he has noticed neck stiffness and left hand stiffness and difficulty performing fine motor skills with the left hand. I reviewed your office note from 01/17/2018. He has had radiating neck pain to the left arm. He has had physical therapy for this. His neck pain has improved.  He fell in 2017 and had neck pain. He had a X ray neck on 03/26/17: IMPRESSION: No acute findings.   Mild spondylosis of the cervical spine with moderate to severe multilevel bilateral neural foraminal narrowing as described.   He denies a family history of Parkinson's disease or tremor. He denies any recent falls. He denies any constipation. Mood is fairly stable but he does admit to having a longer standing history of anxiety. He takes Ativan as needed for this. He also reports claustrophobia and having had difficulty going through an MRI in the past.   He has had some issues with becoming slower in his mobility. His brother recently commented on this. He is the youngest of 4 siblings, he has an older brother, DSimona Huhsister, then another brother. He lives with his family which includes his wife and 154year old daughter and 178year old son. He works as a sArt gallery manager He is a nonsmoker and drinks alcohol in the form of wine, maybe 3 glasses per week on average, caffeine in the form of coffee, 3 cups per day on average. He tries to hydrate well. He exercises regularly.  His Past Medical History Is Significant For: Past Medical History:  Diagnosis Date  . Family history of prostate cancer     DRE WNL; PSA never > 0.38  .  Fasting hyperglycemia     PMH of  . GERD (gastroesophageal reflux disease)   . Tinnitus    w/o hearing loss    His Past Surgical History Is Significant For: Past Surgical History:  Procedure Laterality Date  . COLONOSCOPY  05/2013   Neg, Dr PHenrene Pastor . KNEE SURGERY Right   . TONSILLECTOMY AND ADENOIDECTOMY    . VASECTOMY      His Family History Is Significant For: Family History  Problem Relation Age of Onset  . COPD Mother        smoker  . Prostate cancer Father 751      no longer with prostate cancer  . Breast cancer Sister   . COPD Paternal Aunt        smoker  .  Stroke Maternal Grandfather         late 71s  . Heart disease Neg Hx   . Colon cancer Neg Hx   . Esophageal cancer Neg Hx   . Pancreatic cancer Neg Hx   . Rectal cancer Neg Hx   . Stomach cancer Neg Hx   . Diabetes Neg Hx     His Social History Is Significant For: Social History   Socioeconomic History  . Marital status: Married    Spouse name: Not on file  . Number of children: Not on file  . Years of education: Not on file  . Highest education level: Not on file  Occupational History  . Not on file  Tobacco Use  . Smoking status: Never Smoker  . Smokeless tobacco: Never Used  Substance and Sexual Activity  . Alcohol use: Yes    Comment: very rare wine , < one per month per pt.  . Drug use: No  . Sexual activity: Yes  Other Topics Concern  . Not on file  Social History Narrative  . Not on file   Social Determinants of Health   Financial Resource Strain:   . Difficulty of Paying Living Expenses:   Food Insecurity:   . Worried About Charity fundraiser in the Last Year:   . Arboriculturist in the Last Year:   Transportation Needs:   . Film/video editor (Medical):   Marland Kitchen Lack of Transportation (Non-Medical):   Physical Activity:   . Days of Exercise per Week:   . Minutes of Exercise per Session:   Stress:   . Feeling of Stress :   Social Connections:   . Frequency of Communication  with Friends and Family:   . Frequency of Social Gatherings with Friends and Family:   . Attends Religious Services:   . Active Member of Clubs or Organizations:   . Attends Archivist Meetings:   Marland Kitchen Marital Status:     His Allergies Are:  Allergies  Allergen Reactions  . Penicillins     ? Reaction @ age 52  :   His Current Medications Are:  Outpatient Encounter Medications as of 05/22/2019  Medication Sig  . cyclobenzaprine (FLEXERIL) 10 MG tablet Take 1 tablet (10 mg total) by mouth at bedtime as needed for muscle spasms.  Marland Kitchen LORazepam (ATIVAN) 0.5 MG tablet TAKE ONE TABLET BY MOUTH EVERY 8 HOURS AS NEEDED FOR ANXIETY  . Multiple Vitamins-Minerals (MENS MULTIVITAMIN PLUS) TABS Take by mouth daily.  . rotigotine (NEUPRO) 6 MG/24HR Place 1 patch onto the skin daily.  . sildenafil (VIAGRA) 100 MG tablet Take 1 tablet (100 mg total) by mouth daily as needed for erectile dysfunction.  . tadalafil (CIALIS) 20 MG tablet Take 0.5-1 tablets (10-20 mg total) by mouth every other day as needed for erectile dysfunction.   No facility-administered encounter medications on file as of 05/22/2019.  :  Review of Systems:  Out of a complete 14 point review of systems, all are reviewed and negative with the exception of these symptoms as listed below: Review of Systems  Neurological:       Pt presents today for follow up. Pt reports that neupro 38m is doing well.    Objective:  Neurological Exam  Physical Exam Physical Examination:   Vitals:   05/22/19 0834  BP: 133/88  Pulse: 90    General Examination: The patient is a very pleasant 59y.o. male in no acute distress. He appears  well-developed and well-nourished and well groomed.   HEENT:Normocephalic, atraumatic, pupils are equal, round and reactive to light and accommodation. He has moderate facial masking and decreased eye blink rate. Hearing is grossly intact. Face is otherwise symmetric, no dysarthria, mild hypophonia. He has  moderate nuchal rigidity. No carotid bruits. Airway examination reveals mild mouth dryness, otherwise tongue protrudes centrally and palate elevates symmetrically, and no lip, neck or jaw tremor. No sialorrhea.  Chest:Clear to auscultation without wheezing, rhonchi or crackles noted.  Heart:S1+S2+0, regular and normal without murmurs, rubs or gallops noted.   Abdomen:Soft, non-tender and non-distended with normal bowel sounds appreciated on auscultation.  Extremities:There isnopitting edema in the distal lower extremities bilaterally.   Skin: Warm and dry without trophic changes noted.  Musculoskeletal: exam reveals no obvious joint deformities, tenderness or joint swelling or erythema.   Neurologically:  Mental status: The patient is awake, alert and oriented in all 4 spheres.Hisimmediate and remote memory, attention, language skills and fund of knowledge are appropriate. There is no evidence of aphasia, agnosia, apraxia or anomia. Speech is clear with normal prosody and enunciation. Thought process is linear. Mood is normaland affect is normal.  (On2/19/2020: on Archimedes spiral drawing he has no significant trembling with either hand, handwriting with the right hand is legible, not tremulous, slightly micrographic.)  He has no obvious resting tremor, no significant postural or action tremor. Cranial nerves II - XII are as described above under HEENT exam.At baseline, mildly elevated right shoulder noted, unchanged.  Motor exam: Normal bulk,andstrength, tone is increased in the left upper extremity with mild cogwheeling noted, slight increase in tone in the right upper extremity noted. Otherwise tone is normal. He has no obvious resting tremor. Fine motor skills are moderately impaired with finger taps and hand movements of the left upper extremity, otherwise minimally impaired in the RUE. Mild impairment in the LLE.  Cerebellar testing: No dysmetria or intention  tremor on finger to nose testing. There is no truncal or gait ataxia.  Sensory exam: intact to light touch in the upper and lower extremities.  Gait, station and balance:Hestands easily. No veering to one side is noted. No leaning to one side is noted. Posture ismildly stooped. Stance is narrow based. Gait shows fairly good pace and stride length but near-absence of arm swing on the left. Balance appears preserved.  Assessmentand plan:   In summary,Teon Garneris a very pleasant 49 year oldmalewith an underlying medical history of neck pain, reflux disease, chronic tinnitus, anxiety, and allergic rhinitis, whopresents forFU consultation of his L sided Parkinson's disease. HisDaT scanfrom 08/07/18 showed "Near absent activity in the putamen and asymmetric decreased activity in the head of the RIGHT caudate nucleus is a patternsuggestive of Parkinson's syndrome pathology." He has been on Neupro since May 2020.  We had increased it to 8 mg strength once daily in December 2020 but he started having lightheadedness and more dizziness and last month we reduced it back to 6 mg once daily.  He has had more fatigue and exhaustion, particularly in the early afternoon hours.  He works full-time and has to walk and stand a lot at work.  He also exercises on a regular basis.  He has had some low back stiffness and has used Flexeril as needed with some success.  He is advised to continue to monitor his symptoms and scale back where possible.  We mutually agreed to keep Neupro patch at 6 mg strength.  For his ED he is advised to talk to his  primary care PA about a referral to urology. He is advised to follow-up routinely in 6 months, sooner if needed.  I answered all their questions today and the patient and his wife were in agreement. I spent 35 minutes in total face-to-face time and in reviewing records during pre-charting, more than 50% of which was spent in counseling and coordination of care, reviewing  test results, reviewing medications and treatment regimen and/or in discussing or reviewing the diagnosis of PD, the prognosis and treatment options. Pertinent laboratory and imaging test results that were available during this visit with the patient were reviewed by me and considered in my medical decision making (see chart for details).

## 2019-05-22 NOTE — Telephone Encounter (Signed)
Refilled ativan rx.

## 2019-05-25 ENCOUNTER — Encounter: Payer: Self-pay | Admitting: Neurology

## 2019-05-29 ENCOUNTER — Ambulatory Visit: Payer: 59 | Attending: Internal Medicine

## 2019-05-29 DIAGNOSIS — Z23 Encounter for immunization: Secondary | ICD-10-CM

## 2019-05-29 NOTE — Progress Notes (Signed)
   Covid-19 Vaccination Clinic  Name:  Teja Judice    MRN: [id number] DOB: 1960-02-24  05/29/2019  Mr. Conely was observed post Covid-19 immunization for 15 minutes without incident. He was provided with Vaccine Information Sheet and instruction to access the V-Safe system.   Mr. Magner was instructed to call 911 with any severe reactions post vaccine: Marland Kitchen Difficulty breathing  . Swelling of face and throat  . A fast heartbeat  . A bad rash all over body  . Dizziness and weakness   Immunizations Administered    Name Date Dose VIS Date Route   Pfizer COVID-19 Vaccine 05/29/2019  8:35 AM 0.3 mL 01/24/2019 Intramuscular   Manufacturer: [employer], [employer]   Lot: [id number]   NDC: [address]-0

## 2019-06-10 ENCOUNTER — Ambulatory Visit: Payer: 59 | Admitting: Endocrinology

## 2019-06-10 ENCOUNTER — Other Ambulatory Visit: Payer: Self-pay

## 2019-06-10 ENCOUNTER — Encounter: Payer: Self-pay | Admitting: Endocrinology

## 2019-06-10 DIAGNOSIS — R7989 Other specified abnormal findings of blood chemistry: Secondary | ICD-10-CM | POA: Diagnosis not present

## 2019-06-10 LAB — FOLLICLE STIMULATING HORMONE: FSH: 5.6 m[IU]/mL (ref 1.4–18.1)

## 2019-06-10 LAB — LUTEINIZING HORMONE: LH: 2.69 m[IU]/mL (ref 1.50–9.30)

## 2019-06-10 MED ORDER — TADALAFIL 5 MG PO TABS: 5.0000 mg | ORAL_TABLET | Freq: Every day | ORAL | 11 refills | Status: DC

## 2019-06-10 NOTE — Progress Notes (Signed)
Subjective:    Patient ID: Mark [name], male    DOB: December 29, 1960, 59 y.o.   MRN: [id number]  HPI Pt is referred by Mackie Pai, PA, for hypogonadism.  Pt reports he had puberty at the normal age.  He has 2 biological children.  He says he has never taken illicit androgens.  He has never been on any prescribed medication for hypogonadism.  He does not take antiandrogens or opioids.  He denies any h/o infertility, XRT, or genital infection.  He has never had surgery, or a serious injury to the head or genital area. He has no h/o sleep apnea or DVT.   He does not consume alcohol excessively.  He reports fatigue and ED sxs.  He did not tolerate Viagra or PRN Cialis (visual loss).   Past Medical History:  Diagnosis Date  . Family history of prostate cancer     DRE WNL; PSA never > 0.38  . Fasting hyperglycemia     PMH of  . GERD (gastroesophageal reflux disease)   . Tinnitus    w/o hearing loss    Past Surgical History:  Procedure Laterality Date  . COLONOSCOPY  05/2013   Neg, Dr Henrene Pastor  . KNEE SURGERY Right   . TONSILLECTOMY AND ADENOIDECTOMY    . VASECTOMY      Social History   Socioeconomic History  . Marital status: Married    Spouse name: Not on file  . Number of children: Not on file  . Years of education: Not on file  . Highest education level: Not on file  Occupational History  . Not on file  Tobacco Use  . Smoking status: Never Smoker  . Smokeless tobacco: Never Used  Substance and Sexual Activity  . Alcohol use: Yes    Comment: very rare wine , < one per month per pt.  . Drug use: No  . Sexual activity: Yes  Other Topics Concern  . Not on file  Social History Narrative  . Not on file   Social Determinants of Health   Financial Resource Strain:   . Difficulty of Paying Living Expenses:   Food Insecurity:   . Worried About Charity fundraiser in the Last Year:   . Arboriculturist in the Last Year:   Transportation Needs:   . Film/video editor  (Medical):   Marland Kitchen Lack of Transportation (Non-Medical):   Physical Activity:   . Days of Exercise per Week:   . Minutes of Exercise per Session:   Stress:   . Feeling of Stress :   Social Connections:   . Frequency of Communication with Friends and Family:   . Frequency of Social Gatherings with Friends and Family:   . Attends Religious Services:   . Active Member of Clubs or Organizations:   . Attends Archivist Meetings:   Marland Kitchen Marital Status:   Intimate Partner Violence:   . Fear of Current or Ex-Partner:   . Emotionally Abused:   Marland Kitchen Physically Abused:   . Sexually Abused:     Current Outpatient Medications on File Prior to Visit  Medication Sig Dispense Refill  . cyclobenzaprine (FLEXERIL) 10 MG tablet Take 1 tablet (10 mg total) by mouth at bedtime as needed for muscle spasms. 30 tablet 3  . LORazepam (ATIVAN) 0.5 MG tablet TAKE ONE TABLET BY MOUTH EVERY 8 HOURS AS NEEDED FOR ANXIETY 60 tablet 2  . Multiple Vitamins-Minerals (MENS MULTIVITAMIN PLUS) TABS Take by mouth daily.    Marland Kitchen  rotigotine (NEUPRO) 6 MG/24HR Place 1 patch onto the skin daily. 90 patch 3   No current facility-administered medications on file prior to visit.    Allergies  Allergen Reactions  . Penicillins     ? Reaction @ age 66    Family History  Problem Relation Age of Onset  . COPD Mother        smoker  . Prostate cancer Father 95       no longer with prostate cancer  . Breast cancer Sister   . COPD Paternal Aunt        smoker  . Stroke Maternal Grandfather         late 40s  . Heart disease Neg Hx   . Colon cancer Neg Hx   . Esophageal cancer Neg Hx   . Pancreatic cancer Neg Hx   . Rectal cancer Neg Hx   . Stomach cancer Neg Hx   . Diabetes Neg Hx   . Other Neg Hx        low testosterone    BP 120/80 (BP Location: Left Arm, Patient Position: Sitting, Cuff Size: Normal)   Pulse 94   Ht 6\' 1"  (1.854 m)   Wt 178 lb 9.6 oz (81 kg)   SpO2 99%   BMI 23.56 kg/m     Review of  Systems denies depression, numbness, weight change, decreased urinary stream, gynecomastia, muscle weakness, headache, and sob.       Objective:   Physical Exam VS: see vs page GEN: no distress HEAD: head: no deformity eyes: no periorbital swelling, no proptosis external nose and ears are normal NECK: supple, thyroid is not enlarged CHEST WALL: no deformity LUNGS: clear to auscultation BREASTS:  No gynecomastia CV: reg rate and rhythm, no murmur.  bilat leg VV's.   GENITALIA:  Normal male.   MUSCULOSKELETAL: muscle strength is diffusely decreased from normal.  no joint swelling is seen.  gait is slow but steady.  EXTEMITIES: no leg edema PULSES: no carotid bruit NEURO:  cn 2-12 grossly intact.  sensation is intact to touch on all 4's SKIN:  Normal texture and temperature.  No rash or suspicious lesion is visible.  Normal hair distribution.  NODES:  None palpable at the neck PSYCH: alert, well-oriented.  Does not appear anxious nor depressed.    Lab Results  Component Value Date   WBC 5.1 04/29/2019   HGB 13.8 04/29/2019   HCT 40.4 04/29/2019   MCV 92.7 04/29/2019   PLT 186.0 04/29/2019   Lab Results  Component Value Date   PSA 0.55 04/29/2019   PSA 0.46 09/26/2018   PSA 0.35 03/26/2017    Lab Results  Component Value Date   TESTOSTERONE 492 04/29/2019   Lab Results  Component Value Date   TSH 1.35 05/01/2019   T4TOTAL 11.5 (H) 04/29/2019   I have reviewed outside records, and summarized: Pt was noted to have low free testosterone, and referred here.  He was seen for dizziness.  Multiple possible causes were considered.       Assessment & Plan:  Low free testosterone, new to me.  I rx'ed clomid. recheck 30d ED: visual loss limits rx options.  I changed to QD cialis   Patient Instructions  Blood tests are requested for you today.  We'll let you know about the results.   If necessary, I can prescribe for you a pill to increase the testosterone.   I have  sent a prescription to your pharmacy, for  the once a day Cialis.

## 2019-06-10 NOTE — Patient Instructions (Addendum)
Blood tests are requested for you today.  We'll let you know about the results.   If necessary, I can prescribe for you a pill to increase the testosterone.   I have sent a prescription to your pharmacy, for the once a day Cialis.

## 2019-06-11 LAB — PROLACTIN: Prolactin: 2 ng/mL (ref 2.0–18.0)

## 2019-06-13 LAB — TESTOSTERONE,FREE AND TOTAL
Testosterone, Free: 4.9 pg/mL — ABNORMAL LOW (ref 7.2–24.0)
Testosterone: 505 ng/dL (ref 264–916)

## 2019-06-14 MED ORDER — CLOMIPHENE CITRATE 50 MG PO TABS: ORAL_TABLET | ORAL | 11 refills | Status: DC

## 2019-06-23 ENCOUNTER — Ambulatory Visit: Payer: 59

## 2019-07-10 ENCOUNTER — Encounter: Payer: Self-pay | Admitting: Endocrinology

## 2019-08-16 ENCOUNTER — Other Ambulatory Visit: Payer: Self-pay | Admitting: Medical

## 2019-08-18 ENCOUNTER — Encounter: Payer: Self-pay | Admitting: Medical

## 2019-08-20 ENCOUNTER — Telehealth: Payer: Self-pay | Admitting: Medical

## 2019-08-20 NOTE — Telephone Encounter (Signed)
Pt called and lvm to return call 

## 2019-08-20 NOTE — Telephone Encounter (Signed)
Hx of anxiety for years. Former pt of Dr. Alwyn Ren who rx'd ativan for pt in pt. Up to date on uds and contract. Doing well. Wanted to send this for review.  Thanks,

## 2019-08-20 NOTE — Telephone Encounter (Signed)
Appt scheduled

## 2019-08-20 NOTE — Telephone Encounter (Signed)
Please schedule for controlled med visit. Next month. rx sent today.

## 2019-08-21 ENCOUNTER — Other Ambulatory Visit: Payer: Self-pay | Admitting: Family Medicine

## 2019-08-21 NOTE — Telephone Encounter (Signed)
Reviewed Database reviewed Donato Schultz, DO

## 2019-09-18 ENCOUNTER — Other Ambulatory Visit: Payer: Self-pay | Admitting: Medical

## 2019-09-29 ENCOUNTER — Ambulatory Visit: Payer: 59 | Admitting: Medical

## 2019-10-01 ENCOUNTER — Ambulatory Visit: Payer: 59 | Admitting: Medical

## 2019-10-08 ENCOUNTER — Other Ambulatory Visit: Payer: Self-pay

## 2019-10-08 ENCOUNTER — Ambulatory Visit (INDEPENDENT_AMBULATORY_CARE_PROVIDER_SITE_OTHER): Payer: 59 | Admitting: Medical

## 2019-10-08 VITALS — BP 128/82 | HR 106 | Resp 18 | Ht 73.0 in | Wt 174.6 lb

## 2019-10-08 DIAGNOSIS — G20 Parkinson's disease: Secondary | ICD-10-CM | POA: Diagnosis not present

## 2019-10-08 DIAGNOSIS — F419 Anxiety disorder, unspecified: Secondary | ICD-10-CM

## 2019-10-08 DIAGNOSIS — R5383 Other fatigue: Secondary | ICD-10-CM | POA: Diagnosis not present

## 2019-10-08 NOTE — Progress Notes (Signed)
Subjective:    Patient ID: Mark [name], male    DOB: Jul 08, 1960, 59 y.o.   MRN: [id number]  HPI  Pt in for follow up.  Pt has Parkinsons. He is seeing movement specialist. Pt is still exercising. Pt states job is very demanding. He is walking 4-5 miles a day at work. Pt states fatigues more easily.  Pt also has history of anxiety. Pt using ativan about twice a day for anxiety. Rare use tid. Will update his contract today and give uds.     Review of Systems  Constitutional: Positive for fatigue. Negative for chills and fever.  Respiratory: Negative for choking, chest tightness, shortness of breath and wheezing.   Cardiovascular: Negative for chest pain and palpitations.  Gastrointestinal: Negative for abdominal pain, constipation, nausea and vomiting.  Musculoskeletal: Negative for back pain, joint swelling and neck stiffness.  Skin: Negative for rash.  Neurological: Negative for dizziness, speech difficulty, weakness, numbness and headaches.  Hematological: Negative for adenopathy. Does not bruise/bleed easily.  Psychiatric/Behavioral: Negative for behavioral problems, confusion, sleep disturbance and suicidal ideas. The patient is nervous/anxious.     Past Medical History:  Diagnosis Date  . Family history of prostate cancer     DRE WNL; PSA never > 0.38  . Fasting hyperglycemia     PMH of  . GERD (gastroesophageal reflux disease)   . Tinnitus    w/o hearing loss     Social History   Socioeconomic History  . Marital status: Married    Spouse name: Not on file  . Number of children: Not on file  . Years of education: Not on file  . Highest education level: Not on file  Occupational History  . Not on file  Tobacco Use  . Smoking status: Never Smoker  . Smokeless tobacco: Never Used  Substance and Sexual Activity  . Alcohol use: Yes    Comment: very rare wine , < one per month per pt.  . Drug use: No  . Sexual activity: Yes  Other Topics Concern  . Not on  file  Social History Narrative  . Not on file   Social Determinants of Health   Financial Resource Strain:   . Difficulty of Paying Living Expenses: Not on file  Food Insecurity:   . Worried About Programme researcher, broadcasting/film/video in the Last Year: Not on file  . Ran Out of Food in the Last Year: Not on file  Transportation Needs:   . Lack of Transportation (Medical): Not on file  . Lack of Transportation (Non-Medical): Not on file  Physical Activity:   . Days of Exercise per Week: Not on file  . Minutes of Exercise per Session: Not on file  Stress:   . Feeling of Stress : Not on file  Social Connections:   . Frequency of Communication with Friends and Family: Not on file  . Frequency of Social Gatherings with Friends and Family: Not on file  . Attends Religious Services: Not on file  . Active Member of Clubs or Organizations: Not on file  . Attends Banker Meetings: Not on file  . Marital Status: Not on file  Intimate Partner Violence:   . Fear of Current or Ex-Partner: Not on file  . Emotionally Abused: Not on file  . Physically Abused: Not on file  . Sexually Abused: Not on file    Past Surgical History:  Procedure Laterality Date  . COLONOSCOPY  05/2013   Neg, Dr Marina Goodell  .  KNEE SURGERY Right   . TONSILLECTOMY AND ADENOIDECTOMY    . VASECTOMY      Family History  Problem Relation Age of Onset  . COPD Mother        smoker  . Prostate cancer Father 28       no longer with prostate cancer  . Breast cancer Sister   . COPD Paternal Aunt        smoker  . Stroke Maternal Grandfather         late 74s  . Heart disease Neg Hx   . Colon cancer Neg Hx   . Esophageal cancer Neg Hx   . Pancreatic cancer Neg Hx   . Rectal cancer Neg Hx   . Stomach cancer Neg Hx   . Diabetes Neg Hx   . Other Neg Hx        low testosterone    Allergies  Allergen Reactions  . Penicillins     ? Reaction @ age 37    Current Outpatient Medications on File Prior to Visit  Medication  Sig Dispense Refill  . clomiPHENE (CLOMID) 50 MG tablet 1/4 tab daily 8 tablet 11  . cyclobenzaprine (FLEXERIL) 10 MG tablet Take 1 tablet (10 mg total) by mouth at bedtime as needed for muscle spasms. (Patient not taking: Reported on 10/08/2019) 30 tablet 3  . LORazepam (ATIVAN) 0.5 MG tablet TAKE ONE TABLET BY MOUTH EVERY 8 HOURS AS NEEDED FOR ANXIETY 60 tablet 0  . Multiple Vitamins-Minerals (MENS MULTIVITAMIN PLUS) TABS Take by mouth daily.    . rotigotine (NEUPRO) 6 MG/24HR Place 1 patch onto the skin daily. 90 patch 3  . tadalafil (CIALIS) 5 MG tablet Take 1 tablet (5 mg total) by mouth daily. 30 tablet 11   No current facility-administered medications on file prior to visit.    BP 128/82 (BP Location: Left Arm, Patient Position: Sitting, Cuff Size: Large)   Pulse (!) 106   Resp 18   Ht 6\' 1"  (1.854 m)   Wt 174 lb 9.6 oz (79.2 kg)   SpO2 100%   BMI 23.04 kg/m       Objective:   Physical Exam  General Mental Status- Alert. General Appearance- Not in acute distress.   Skin General: Color- Normal Color. Moisture- Normal Moisture.  Neck Carotid Arteries- Normal color. Moisture- Normal Moisture. No carotid bruits. No JVD.  Chest and Lung Exam Auscultation: Breath Sounds:-Normal.  Cardiovascular Auscultation:Rythm- Regular. Murmurs & Other Heart Sounds:Auscultation of the heart reveals- No Murmurs.  Abdomen Inspection:-Inspeection Normal. Palpation/Percussion:Note:No mass. Palpation and Percussion of the abdomen reveal- Non Tender, Non Distended + BS, no rebound or guarding.  Neurologic Cranial Nerve exam:- CN III-XII intact(No nystagmus), symmetric smile. Strength:- 5/5 equal and symmetric strength both upper and lower extremities.      Assessment & Plan:  For parkinson continue to follow management by neurologist.  For fatigue will get b12, b1, vit D and iron level. In march labs looked good.  For anxiety continue ativan. Update contract and uds  today.  Follow up 4 months or as needed.  April, PA-C   Time spent with patient today was 24  minutes which consisted of chart review, discussing diagnoses, work up, treatment and documentation.

## 2019-10-08 NOTE — Patient Instructions (Addendum)
For parkinson continue to follow management by neurologist.  For fatigue will get b12, b1, vit D and iron level. In march labs looked good.  For anxiety continue ativan. Update contract and uds today.  Follow up 4 months or as needed.

## 2019-10-09 ENCOUNTER — Encounter: Payer: Self-pay | Admitting: Medical

## 2019-10-09 ENCOUNTER — Telehealth: Payer: Self-pay | Admitting: Medical

## 2019-10-09 DIAGNOSIS — E611 Iron deficiency: Secondary | ICD-10-CM

## 2019-10-09 MED ORDER — FERROUS SULFATE 324 (65 FE) MG PO TBEC: DELAYED_RELEASE_TABLET | ORAL | 0 refills | Status: DC

## 2019-10-09 NOTE — Telephone Encounter (Signed)
Future lab placed  

## 2019-10-10 ENCOUNTER — Encounter: Payer: Self-pay | Admitting: Medical

## 2019-10-11 LAB — DRUG MONITORING, PANEL 8 WITH CONFIRMATION, URINE
6 Acetylmorphine: NEGATIVE ng/mL (ref <10)
Alcohol Metabolites: NEGATIVE ng/mL
Alphahydroxyalprazolam: NEGATIVE ng/mL (ref <25)
Alphahydroxymidazolam: NEGATIVE ng/mL (ref <50)
Alphahydroxytriazolam: NEGATIVE ng/mL (ref <50)
Aminoclonazepam: NEGATIVE ng/mL (ref <25)
Amphetamines: NEGATIVE ng/mL (ref <500)
Benzodiazepines: POSITIVE ng/mL — AB (ref <100)
Buprenorphine, Urine: NEGATIVE ng/mL (ref <5)
Buprenorphine: NEGATIVE ng/mL (ref <2)
Cocaine Metabolite: NEGATIVE ng/mL (ref <150)
Creatinine: 171.6 mg/dL
Hydroxyethylflurazepam: NEGATIVE ng/mL (ref <50)
Lorazepam: 619 ng/mL — ABNORMAL HIGH (ref <50)
MDMA: NEGATIVE ng/mL (ref <500)
Marijuana Metabolite: NEGATIVE ng/mL (ref <20)
Naloxone: NEGATIVE ng/mL (ref <2)
Norbuprenorphine: NEGATIVE ng/mL (ref <2)
Nordiazepam: NEGATIVE ng/mL (ref <50)
Opiates: NEGATIVE ng/mL (ref <100)
Oxazepam: NEGATIVE ng/mL (ref <50)
Oxidant: NEGATIVE ug/mL
Oxycodone: NEGATIVE ng/mL (ref <100)
Temazepam: NEGATIVE ng/mL (ref <50)
pH: 6.6 (ref 4.5–9.0)

## 2019-10-11 LAB — VITAMIN B1: Vitamin B1 (Thiamine): 16 nmol/L (ref 8–30)

## 2019-10-11 LAB — DM TEMPLATE

## 2019-10-12 LAB — IRON: Iron: 33 ug/dL — ABNORMAL LOW (ref 50–180)

## 2019-10-12 LAB — VITAMIN D 1,25 DIHYDROXY
Vitamin D 1, 25 (OH)2 Total: 31 pg/mL (ref 18–72)
Vitamin D2 1, 25 (OH)2: <8 pg/mL
Vitamin D3 1, 25 (OH)2: 31 pg/mL

## 2019-10-12 LAB — EXTRA LAV TOP TUBE

## 2019-10-12 LAB — VITAMIN B12: Vitamin B-12: 583 pg/mL (ref 200–1100)

## 2019-10-16 ENCOUNTER — Other Ambulatory Visit: Payer: Self-pay | Admitting: Medical

## 2019-10-21 ENCOUNTER — Other Ambulatory Visit: Payer: Self-pay | Admitting: Neurology

## 2019-11-12 ENCOUNTER — Other Ambulatory Visit: Payer: Self-pay

## 2019-11-12 ENCOUNTER — Encounter: Payer: Self-pay | Admitting: Neurology

## 2019-11-12 ENCOUNTER — Ambulatory Visit: Payer: 59 | Admitting: Neurology

## 2019-11-12 VITALS — BP 130/82 | HR 92 | Ht 73.0 in | Wt 171.0 lb

## 2019-11-12 DIAGNOSIS — R498 Other voice and resonance disorders: Secondary | ICD-10-CM

## 2019-11-12 DIAGNOSIS — G20 Parkinson's disease: Secondary | ICD-10-CM

## 2019-11-12 DIAGNOSIS — F419 Anxiety disorder, unspecified: Secondary | ICD-10-CM | POA: Diagnosis not present

## 2019-11-12 DIAGNOSIS — Z8616 Personal history of COVID-19: Secondary | ICD-10-CM | POA: Diagnosis not present

## 2019-11-12 DIAGNOSIS — R1310 Dysphagia, unspecified: Secondary | ICD-10-CM

## 2019-11-12 MED ORDER — ROPINIROLE HCL 1 MG PO TABS: ORAL_TABLET | ORAL | 3 refills | Status: DC

## 2019-11-12 MED ORDER — ESCITALOPRAM OXALATE 5 MG PO TABS: 5.0000 mg | ORAL_TABLET | Freq: Every day | ORAL | 5 refills | Status: DC

## 2019-11-12 MED ORDER — NEUPRO 2 MG/24HR TD PT24: 1.0000 | MEDICATED_PATCH | Freq: Every day | TRANSDERMAL | 0 refills | Status: DC

## 2019-11-12 MED ORDER — NEUPRO 4 MG/24HR TD PT24: 1.0000 | MEDICATED_PATCH | Freq: Every day | TRANSDERMAL | 0 refills | Status: DC

## 2019-11-12 NOTE — Progress Notes (Signed)
Subjective:    Patient ID: Mark [name] is a 59 y.o. male.  HPI     Interim history:   Mr. Mark [name] is a 60 year old right-handed gentleman with an underlying medical history of neck pain, reflux disease, chronic tinnitus, anxiety, and allergic rhinitis, who presents for follow-up consultation of his parkinsonism, likely left-sided predominant Parkinson's disease with akinetic-rigid presentation.  The patient is accompanied by his wife, [name], again today. I last saw him on 05/22/2019, at which time he reported that his dizziness had improved after he reduced the Neupro back to 6 mg daily from 8 mg daily.  He has had intermittent issues with anxiety.  Back stiffness was fairly stable, Flexeril as needed was helpful.  His wife was worried about his extensive exercise and that he would tire himself out.  We talked about moderation when he came to work schedule and exercising.  He reported some tingling on the left side and feeling a cold sensation in the left arm and hand intermittently.  He did not have any significant constipation or sleep issues.  He and his wife were worried about his EEG.  He had tried Viagra but had side effects.  He had a prescription for Cialis but had not tried it.  I encouraged him to consider seeing a urologist and talk to his primary care provider about a referral, if the Cialis was not helpful. He was advised to continue with the Neupro patch 6 mg once daily.  Today, 11/12/2019: He reports feeling better, no residual cough.  He admits that he was only partially vaccinated in April with the Pfizer vaccine, never got a second dose.  Unfortunately, his wife has developed symptoms as well and his daughter, all are better now, thankfully.  He is scheduled for his vaccine dose at the end of next month.  He has increased his Neupro back to 8 mg, switched back to went about 2 months ago but has not had much in the way of significant improvement in his stiffness.  His anxiety level is  worse.  He has particularly noticed stress at work.  He has to deal with people at work.  His wife is concerned that his anxiety has increased and he also has had more irritability, low frustration tolerance at times, seems to be more snappy.  It used to be that he has had more anxiety at work but sometimes it is noticeable also in their day-to-day lives at home.  She has noticed that he has a hard time swallowing larger pills.  He does have some difficulty with facial masking and sometimes his mouth tends to stay open which is frustrating for him.  He has not had any evaluation through speech therapy or therapy in general.  He does try to exercise on a regular basis and is regular with his physical activity.  He is good about hydrating.  No significant issue with balance or recent falls.  No significant issues with constipation lately.  He was diagnosed with Covid 19 at the end of July.  He had a positive test on 09/08/2019 through [facility].  He had a rapid test a few days before which was negative.  His daughter had tested positive and had symptoms.  His symptoms included nausea, overall feeling unwell, productive cough with discolored sputum.  He was vaccinated in April with the Pfizer vaccine, one dose only.  He reports that he neglected to take his second dose as life got in the way.   The  patient's allergies, current medications, family history, past medical history, past social history, past surgical history and problem list were reviewed and updated as appropriate.    Previously    I saw him on 01/21/2019, at which time he reported feeling better with regards to his restless leg symptoms.  He was taking melatonin at night, 5 mg strength.  He was noticing more stiffness and slowness.  I suggested we increase his Neupro to 8 mg daily.  He also reported back stiffness and back pain in the mid back area, was taking ibuprofen as needed and I suggested a trial of Flexeril.  He emailed in the interim in  January 2021, reporting some bouts of constipation.  He and his wife also emailed through [employer] last month reporting that he felt dizzy and lightheaded.  Eventually, we decided to reduce his Neupro patch back to 6 mg daily.     I saw him on 10/01/2018, at which time he reported feeling better after starting his medication.  He also was not using his Ativan as much.  He was working full-time, very active overall, walking a lot.  He had noticed some intermittent sensory symptoms, nothing sustained.  He was advised to continue with the Neupro patch 6 mg strength.    He emailed in the interim with increase in restless leg symptoms, he was advised to try gabapentin in September 2020.     I saw him on 07/02/2018 in virtual visit, at which time we mutually agreed to start him on Neupro patch.  We increased it in the recent past to 6 mg daily.  He emailed recently regarding some swelling he had noticed in the left upper and lower extremities.  He had an ultrasound of the left upper extremity which was negative for a blood clot.       I first met him on 04/03/2018 at the request of Dr. Barbaraann Barthel, at which time the patient reported a several month history, probably a 53-month history of left hand tremor, neck stiffness, difficulty with fine motor control with the left upper extremity, and hand stiffness.  His history and physical examination were in keeping with parkinsonism on the left side.  We talked about symptomatic treatment options to be utilized soon, I suggested we proceed with a nuclear medicine DaTscan.  Due to the COVID-19 pandemic, his scan has not yet been done, he was approved for it though.   The patient sent a MyChart message in the interim in late March 2020 indicating that he has noticed more symptoms including some difficulty with his speech, including slurring, increased salivation, causing drooling at times and some symptoms in his right hand.     04/03/2018: (He) reports a left hand tremor  for the past several months, likely 6-9 months. In addition, he has noted difficulty with fine motor control, he has noticed neck stiffness and left hand stiffness and difficulty performing fine motor skills with the left hand. I reviewed your office note from 01/17/2018. He has had radiating neck pain to the left arm. He has had physical therapy for this. His neck pain has improved.  He fell in 2017 and had neck pain. He had a X ray neck on 03/26/17: IMPRESSION: No acute findings.   Mild spondylosis of the cervical spine with moderate to severe multilevel bilateral neural foraminal narrowing as described.   He denies a family history of Parkinson's disease or tremor. He denies any recent falls. He denies any constipation. Mood is fairly stable  but he does admit to having a longer standing history of anxiety. He takes Ativan as needed for this. He also reports claustrophobia and having had difficulty going through an MRI in the past.   He has had some issues with becoming slower in his mobility. His brother recently commented on this. He is the youngest of 4 siblings, he has an older brother, Simona Huh sister, then another brother. He lives with his family which includes his wife and 22 year old daughter and 23 year old son. He works as a Art gallery manager. He is a nonsmoker and drinks alcohol in the form of wine, maybe 3 glasses per week on average, caffeine in the form of coffee, 3 cups per day on average. He tries to hydrate well. He exercises regularly.  His Past Medical History Is Significant For: Past Medical History:  Diagnosis Date  . Family history of prostate cancer     DRE WNL; PSA never > 0.38  . Fasting hyperglycemia     PMH of  . GERD (gastroesophageal reflux disease)   . Tinnitus    w/o hearing loss    His Past Surgical History Is Significant For: Past Surgical History:  Procedure Laterality Date  . COLONOSCOPY  05/2013   Neg, Dr Henrene Pastor  . KNEE SURGERY Right   . TONSILLECTOMY AND  ADENOIDECTOMY    . VASECTOMY      His Family History Is Significant For: Family History  Problem Relation Age of Onset  . COPD Mother        smoker  . Prostate cancer Father 16       no longer with prostate cancer  . Breast cancer Sister   . COPD Paternal Aunt        smoker  . Stroke Maternal Grandfather         late 27s  . Heart disease Neg Hx   . Colon cancer Neg Hx   . Esophageal cancer Neg Hx   . Pancreatic cancer Neg Hx   . Rectal cancer Neg Hx   . Stomach cancer Neg Hx   . Diabetes Neg Hx   . Other Neg Hx        low testosterone    His Social History Is Significant For: Social History   Socioeconomic History  . Marital status: Married    Spouse name: Not on file  . Number of children: Not on file  . Years of education: Not on file  . Highest education level: Not on file  Occupational History  . Not on file  Tobacco Use  . Smoking status: Never Smoker  . Smokeless tobacco: Never Used  Substance and Sexual Activity  . Alcohol use: Yes    Comment: very rare wine , < one per month per pt.  . Drug use: No  . Sexual activity: Yes  Other Topics Concern  . Not on file  Social History Narrative  . Not on file   Social Determinants of Health   Financial Resource Strain:   . Difficulty of Paying Living Expenses: Not on file  Food Insecurity:   . Worried About Charity fundraiser in the Last Year: Not on file  . Ran Out of Food in the Last Year: Not on file  Transportation Needs:   . Lack of Transportation (Medical): Not on file  . Lack of Transportation (Non-Medical): Not on file  Physical Activity:   . Days of Exercise per Week: Not on file  . Minutes of Exercise per Session: Not  on file  Stress:   . Feeling of Stress : Not on file  Social Connections:   . Frequency of Communication with Friends and Family: Not on file  . Frequency of Social Gatherings with Friends and Family: Not on file  . Attends Religious Services: Not on file  . Active Member of  Clubs or Organizations: Not on file  . Attends Archivist Meetings: Not on file  . Marital Status: Not on file    His Allergies Are:  Allergies  Allergen Reactions  . Penicillins     ? Reaction @ age 8  :   His Current Medications Are:  Outpatient Encounter Medications as of 11/12/2019  Medication Sig  . clomiPHENE (CLOMID) 50 MG tablet 1/4 tab daily  . cyclobenzaprine (FLEXERIL) 10 MG tablet TAKE ONE TABLET BY MOUTH EVERY NIGHT AT BEDTIME AS NEEDED FOR MUSCLE SPASMS  . ferrous sulfate 324 (65 Fe) MG TBEC 1 tab po bid  . LORazepam (ATIVAN) 0.5 MG tablet TAKE ONE TABLET BY MOUTH EVERY 8 HOURS AS NEEDED FOR ANXIETY  . Multiple Vitamins-Minerals (MENS MULTIVITAMIN PLUS) TABS Take by mouth daily.  . rotigotine (NEUPRO) 6 MG/24HR Place 1 patch onto the skin daily.  . tadalafil (CIALIS) 5 MG tablet Take 1 tablet (5 mg total) by mouth daily.  Marland Kitchen escitalopram (LEXAPRO) 5 MG tablet Take 1 tablet (5 mg total) by mouth at bedtime.  Marland Kitchen rOPINIRole (REQUIP) 1 MG tablet Start half a pill 3 times daily for 1 week, then 1 pill 3 times daily for 1 week, then 2 pills 3 times daily thereafter.  . rotigotine (NEUPRO) 2 MG/24HR Place 1 patch onto the skin daily. Tapering dose, part of multiple Rx  . rotigotine (NEUPRO) 4 MG/24HR Place 1 patch onto the skin daily. Tapering off, therefore multiple Rx   No facility-administered encounter medications on file as of 11/12/2019.  :  Review of Systems:  Out of a complete 14 point review of systems, all are reviewed and negative with the exception of these symptoms as listed below:   Review of Systems  Neurological:       Here for 6 month f/u. Pt reports he has been doing ok. He does report increased fatigue, stiffness, increased BP/HR, and tingling on the left side. Denies any falls and has noticed intermittent ear ringing.     Objective:  Neurological Exam  Physical Exam Physical Examination:   Vitals:   11/12/19 0919  BP: 130/82  Pulse: 92   SpO2: 98%    General Examination: The patient is a very pleasant 58 y.o. male in no acute distress. He appears well-developed and well-nourished and well groomed.   HEENT:Normocephalic, atraumatic, pupils are equal, round and reactive to light and accommodation. He has moderatefacial masking and decreased eye blink rate. Hearing is grossly intact. Face is otherwise symmetric, no dysarthria, mild hypophonia. He has moderate nuchal rigidity. No carotid bruits. Airway examination reveals no significant mouth dryness, otherwise tongue protrudes centrally and palate elevates symmetrically, and no lip, neck or jaw tremor.No sialorrhea.  Chest:Clear to auscultation without wheezing, rhonchi or crackles noted.  Heart:S1+S2+0, regular and normal without murmurs, rubs or gallops noted.   Abdomen:Soft, non-tender and non-distended with normal bowel sounds appreciated on auscultation.  Extremities:There isnopitting edema in the distal lower extremities bilaterally.   Skin: Warm and dry without trophic changes noted.  Musculoskeletal: exam reveals no obvious joint deformities, tenderness or joint swelling or erythema.   Neurologically:  Mental status: The patient is  awake, alert and oriented in all 4 spheres.Hisimmediate and remote memory, attention, language skills and fund of knowledge are appropriate. There is no evidence of aphasia, agnosia, apraxia or anomia. Speech is clear with normal prosody and enunciation. Thought process is linear. Mood is normaland affect is normal.  (On2/19/2020: on Archimedes spiral drawing he has no significant trembling with either hand, handwriting with the right hand is legible, not tremulous, slightly micrographic.)  He has no obvious resting tremor, no significant postural or action tremor. Cranial nerves II - XII are as described above under HEENT exam.At baseline, mildly elevated right shoulder noted, unchanged. Motor exam: Normal  bulk,andstrength, tone is increased in both upper extremities with some cogwheeling noted on the left side but slightly increase in tone compared to last time.  He has no obvious resting tremor. Fine motor skills are moderately impaired with finger taps and hand movements of the left upper extremity, mildly impaired on the right, foot agility mild to moderately impaired on the left and mildly so on the right.   Cerebellar testing: No dysmetria or intention tremor. There is no truncal or gait ataxia.  Sensory exam: intact to light touch in the upper and lower extremities.  Gait, station and balance:Hestands easily. No veering to one side is noted. No leaning to one side is noted. Posture ismildly stooped. Stance is narrow based. Gait shows fairly good pace and stride length but near-absence of arm swing on the left, decreased arm swing on the right. Balance appears preserved.  Assessmentand plan:   In summary,Dusten Garneris a very pleasant 50 year oldmalewith an underlying medical history of neck pain, reflux disease, chronic tinnitus, anxiety, and allergic rhinitis, whopresents forFU consultation of his L sided Parkinson's disease. HisDaT scanfrom 08/07/18 showed "Near absent activity in the putamen and asymmetric decreased activity in the head of the RIGHT caudate nucleus is a patternsuggestive of Parkinson's syndrome pathology." He has been on Neupro since May 2020.  We had increased it to 8 mg strength once daily in December 2020 but he started having lightheadedness and more dizziness and last month we reduced it back to 6 mg once daily. He has had more fatigue and exhaustion, particularly in the early afternoon hours.  He works full-time and has to walk and stand a lot at work.  He also exercises on a regular basis.  He had Covid infection in July.  He has been recuperating.  He was partially vaccinated in April.  He increased his Neupro back to 8 mg once daily about 2 months ago.  He  still has significant stiffness and slowness.  His wife has noted swallowing difficulty and more speech difficulty.  He has had more stress and anxiety symptoms. We mutually agreed to taper him off the Neupro.  He is advised to go down to 6 mg for 1 week, then 4 mg daily for 1 week, then 2 mg daily for 1 week then stop.  I would like for him to start Lexapro 5 mg strength once daily for anxiety.  We talked about expectations and potential side effects.  When he is on Neupro 4 mg strength he is advised to cross titrate with Requip 1 mg pills, half a pill 3 times daily for 1 week, then 1 pill 3 times daily for 1 week, then 2 pills 3 times daily thereafter.  We talked about potential side effects. We mutually agreed to seek evaluations through neuro rehab, PT, OT and ST and likely also pursue speech therapy for  his hypophonia and dysphagia at times.  He is agreeable. He is advised to follow-up in 3 months, sooner if needed.  I answered all the questions today and the patient and his wife are in agreement.  I spent 45 minutes in total face-to-face time and in reviewing records during pre-charting, more than 50% of which was spent in counseling and coordination of care, reviewing test results, reviewing medications and treatment regimen and/or in discussing or reviewing the diagnosis of PD, the prognosis and treatment options. Pertinent laboratory and imaging test results that were available during this visit with the patient were reviewed by me and considered in my medical decision making (see chart for details).

## 2019-11-12 NOTE — Patient Instructions (Addendum)
For your stress and anxiety, I would like for you to start Lexapro 5 mg strength:  Take 1 pill once daily in the evening.  If you have insomnia from it, you can take 1 pill once daily in the morning.  Side effects may include dizziness, sleepiness, headaches, feeling off balance.  This drug may have a black box warning from the FDA indicating increase in anxiety or depression and suicidal ideations.  If you have any adverse effects, please call us or email [place] immediately and we may have to stop the medication.  We will taper you off the Neupro at this time because of side effects and lack of enough efficacy.  Please go back down to the 6 mg patches, take 1 patch once daily for the first week, then 4 mg patches, take 1 patch once daily for 1 week, then go down to the 2 mg patches, take 1 patch once daily for 1 week then stop.  When you are on the 4 mg strength Neupro you can cross titrate with the new medication called Requip or ropinirole (generic): 1 mg pills, take half a pill 3 times daily for 1 week, then 1 pill 3 times daily for 1 week, then 2 pills 3 times daily thereafter.   Side effects with any dopamine agonist (neupro, requip or mirapex) may include: Sedation, sleepiness, nausea, vomiting, and rare side effects are confusion, hallucinations, swelling in legs, and abnormal behaviors, including impulse control problems, which can manifest as excessive eating, obsessions with food or gambling, or hypersexuality.  Please follow-up in 3 months, keep [place] posted by phone call or through [employer].

## 2019-11-13 ENCOUNTER — Encounter: Payer: Self-pay | Admitting: Medical

## 2019-11-14 ENCOUNTER — Other Ambulatory Visit: Payer: 59

## 2019-11-19 ENCOUNTER — Encounter: Payer: Self-pay | Admitting: Neurology

## 2019-11-24 ENCOUNTER — Ambulatory Visit: Payer: 59 | Admitting: Neurology

## 2019-11-25 ENCOUNTER — Encounter: Payer: Self-pay | Admitting: Neurology

## 2019-12-01 ENCOUNTER — Encounter: Payer: Self-pay | Admitting: Neurology

## 2019-12-08 ENCOUNTER — Encounter: Payer: Self-pay | Admitting: Neurology

## 2019-12-10 ENCOUNTER — Encounter: Payer: Self-pay | Admitting: Neurology

## 2019-12-19 ENCOUNTER — Ambulatory Visit: Payer: 59 | Admitting: Physical Therapy

## 2019-12-19 ENCOUNTER — Ambulatory Visit: Payer: 59 | Attending: Neurology | Admitting: Occupational Therapy

## 2019-12-19 ENCOUNTER — Encounter: Payer: Self-pay | Admitting: Occupational Therapy

## 2019-12-19 ENCOUNTER — Ambulatory Visit: Payer: 59

## 2019-12-19 ENCOUNTER — Other Ambulatory Visit: Payer: Self-pay

## 2019-12-19 DIAGNOSIS — R2681 Unsteadiness on feet: Secondary | ICD-10-CM

## 2019-12-19 DIAGNOSIS — R1312 Dysphagia, oropharyngeal phase: Secondary | ICD-10-CM

## 2019-12-19 DIAGNOSIS — R29818 Other symptoms and signs involving the nervous system: Secondary | ICD-10-CM | POA: Insufficient documentation

## 2019-12-19 DIAGNOSIS — R29898 Other symptoms and signs involving the musculoskeletal system: Secondary | ICD-10-CM

## 2019-12-19 DIAGNOSIS — R498 Other voice and resonance disorders: Secondary | ICD-10-CM | POA: Insufficient documentation

## 2019-12-19 DIAGNOSIS — M25622 Stiffness of left elbow, not elsewhere classified: Secondary | ICD-10-CM | POA: Insufficient documentation

## 2019-12-19 DIAGNOSIS — R2689 Other abnormalities of gait and mobility: Secondary | ICD-10-CM

## 2019-12-19 DIAGNOSIS — R262 Difficulty in walking, not elsewhere classified: Secondary | ICD-10-CM

## 2019-12-19 DIAGNOSIS — R471 Dysarthria and anarthria: Secondary | ICD-10-CM | POA: Insufficient documentation

## 2019-12-19 DIAGNOSIS — R293 Abnormal posture: Secondary | ICD-10-CM

## 2019-12-19 DIAGNOSIS — R278 Other lack of coordination: Secondary | ICD-10-CM | POA: Insufficient documentation

## 2019-12-19 DIAGNOSIS — M25612 Stiffness of left shoulder, not elsewhere classified: Secondary | ICD-10-CM | POA: Insufficient documentation

## 2019-12-19 NOTE — Therapy (Addendum)
[facility] [address] [place], [place], [address] Phone: [phone]   Fax:  [phone]  Physical Therapy Evaluation  Patient Details  Name: Mark [name] MRN: [id number] Date of Birth: Jan 04, 1961 Referring Provider (PT): Huston Foley, MD   Encounter Date: 12/19/2019   PT End of Session - 12/19/19 1155    Visit Number 1    Number of Visits 17    Date for PT Re-Evaluation 03/18/20   written for 60 day POC   Authorization Type UC - 23 visit limit PT, 23 visit limit OT, 30 visit limit ST    Authorization - Number of Visits 30    PT Start Time 0802    PT Stop Time 0847    PT Time Calculation (min) 45 min    Equipment Utilized During Treatment Gait belt    Activity Tolerance Patient tolerated treatment well    Behavior During Therapy [facility] for tasks assessed/performed           Past Medical History:  Diagnosis Date  . Family history of prostate cancer     DRE WNL; PSA never > 0.38  . Fasting hyperglycemia     PMH of  . GERD (gastroesophageal reflux disease)   . Tinnitus    w/o hearing loss    Past Surgical History:  Procedure Laterality Date  . COLONOSCOPY  05/2013   Neg, Dr Marina Goodell  . KNEE SURGERY Right   . TONSILLECTOMY AND ADENOIDECTOMY    . VASECTOMY      There were no vitals filed for this visit.    Subjective Assessment - 12/19/19 0804    Subjective Has stiffness from his left side from PD - especially in his L shoulder all the way down to the arm. Notices his walking is a little slower. 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 Wife reports his balance gets worse when he gets tired, reports difficulties getting up from the couch. Wife also reports pt tripping over objects. Balancing on one leg is difficult when he is trying to get dressed. No falls. Does a PD cycling class at the [facility]. does virtual rock steady boxing 1-2x per week.    Patient is accompained by: Family member    wife, Mark [name]   Pertinent History neck pain, reflux disease, chronic tinnitus, anxiety, and allergic rhinitis, hx of COVID in July    Patient Stated Goals wants to get his L arm a little more flexible, improve his balance.    Currently in Pain? No/denies              [facility] PT Assessment - 12/19/19 0811      Assessment   Medical Diagnosis PD    Referring Provider (PT) Huston Foley, MD    Onset Date/Surgical Date --   diagnosed early 2020   Hand Dominance Right    Prior Therapy previous PT in the past for neck pain      Precautions   Precautions Fall      Balance Screen   Has the patient fallen in the past 6 months No    Has the patient had a decrease in activity level because of a fear of falling?  No    Is the patient reluctant to leave their home because of a fear of falling?  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 and is on his feet    Leisure enjoys walking (used to be a runner), going to kid's sporting games/events, sometimes he tries to shoot basketball with his son, has a very active family      Sensation   Light Touch Appears Intact      Coordination   Gross Motor Movements are Fluid and Coordinated No    Heel Shin Test slowed movements L>R      ROM / Strength   AROM / PROM / Strength Strength      Strength   Strength Assessment Site Hip;Knee;Ankle    Right/Left Hip Right;Left    Right Hip  Flexion 5/5    Left Hip Flexion 5/5    Right/Left Knee Left;Right    Right Knee Flexion 5/5    Right Knee Extension 5/5    Left Knee Flexion 5/5    Left Knee Extension 5/5    Right/Left Ankle Right;Left    Right Ankle Dorsiflexion 4+/5    Left Ankle Dorsiflexion 4+/5      Transfers   Transfers Sit to Stand;Stand to Sit    Sit to Stand 5: Supervision;Without upper extremity assist;From chair/3-in-1    Five time sit to stand comments  15.09 seconds no UE support    Stand to Sit 5: Supervision;To chair/3-in-1;Without upper extremity assist      Ambulation/Gait   Ambulation/Gait Yes    Ambulation/Gait Assistance 5: Supervision    Ambulation Distance (Feet) --   clinic distances   Assistive device None    Gait Pattern Decreased arm swing - left;Decreased arm swing - right;Step-through pattern;Decreased stride length   decr foot clearance at times   Ambulation Surface Level;Indoor    Gait velocity 9.28 seconds = 3.53 ft/sec      Standardized Balance Assessment   Standardized Balance Assessment Timed Up and Go Test;Mini-BESTest      Mini-BESTest   Sit To Stand Normal: Comes to stand without use of hands and stabilizes independently.    Rise to Toes Moderate: Heels up, but not full range (smaller than when holding hands), OR noticeable instability for 3 s.    Stand on one leg (left) Moderate: < 20 s   1.93, 4.10   Stand on one leg (right) Moderate: < 20 s   18.8,  3,68   Stand on one leg - lowest score 1    Compensatory Stepping Correction - Forward Normal: Recovers independently with a single, large step (second realignement is allowed).    Compensatory Stepping Correction - Backward Moderate: More than one step is required to recover equilibrium    Compensatory Stepping Correction - Left Lateral Moderate: Several steps to recover equilibrium    Compensatory Stepping Correction - Right Lateral Moderate: Several steps to recover equilibrium    Stepping Corredtion Lateral - lowest score  1    Stance - Feet together, eyes open, firm  surface  Normal: 30s    Stance - Feet together, eyes closed, foam surface  Moderate: < 30s   8 seconds   Incline - Eyes Closed Normal: Stands independently 30s and aligns with gravity    Change in Gait Speed Normal: Significantly changes walkling speed without imbalance    Walk with head turns - Horizontal Moderate: performs head turns with reduction in gait speed.    Walk with pivot turns Normal: Turns with feet close FAST (< 3 steps) with good balance.    Step over obstacles Normal: Able to step over box with minimal change of gait speed and with good balance.    Timed UP & GO with Dual Task Normal: No noticeable change in sitting, standing or walking while backward counting when compared to TUG without    Mini-BEST total score 22      Timed Up and Go Test   Normal TUG (seconds) 9.62    Manual TUG (seconds) 10.53    Cognitive TUG (seconds) 11.46   starting at 77 counting backwards by 3                     Objective measurements completed on examination: See above findings.               PT Education - 12/19/19 1154    Education Details clinical findings, POC    Person(s) Educated Patient;Spouse    Methods Explanation    Comprehension Verbalized understanding              PT Short Term Goals - 12/21/19 1940      PT SHORT TERM GOAL #1   Title Pt will be independent with initial HEP in order to build upon functional gains made in therapy. ALL STGS DUE 12/21/19    Time 4    Period Weeks    Status New      PT SHORT TERM GOAL #2   Title Pt will undergo with LTG to be written as appropriate.    Time 4    Period Weeks    Status New      PT SHORT TERM GOAL #3   Title Pt will recover posterior balance in push and release test in 1 step independently, for improved balance recovery    Time 4    Period Weeks    Status New      PT SHORT TERM GOAL #4   Title Pt will decr 5x sit <> stand time to 12.5  seconds or less in order to demo improved functional BLE strength and transfer efficiency.    Baseline 15.09 seconds with no UE support.    Time 4    Period Weeks    Status New              PT Long Term Goals - 12/21/19 1943      PT LONG TERM GOAL #1   Title Pt will be independent with final HEP. ALL LTGS DUE 02/15/20    Time 8    Period Weeks    Status New    Target Date 02/15/20      PT LONG TERM GOAL #2   Title Pt will verbalize understanding of local Parkinson's disease resources, including options for continue community fitness.    Time 8    Period Weeks    Status New      PT LONG TERM GOAL #3   Title Pt will improve miniBEST to at least  a 25/28 to demo improved balance and decr fall risk.    Baseline 22/28    Time 8    Period Weeks    Status New      PT LONG TERM GOAL #4   Title goal to be written as appropriate to demo improved walking endurance and gait efficiency for pt's job.    Time 8    Period Weeks    Status New      PT LONG TERM GOAL #5   Title Pt will perform 12 steps with step through pattern and single handrail vs. no handrail with mod I for improved stair negotiation in the house.    Time 8    Period Weeks    Status New               12/21/19 1946  Plan  Clinical Impression Statement Patient is a 59 year old male referred to Neuro OPPT for PD. Pt's PMH is significant for: PD, hx of neck pain, chronic tinnitus, anxiety, hx of COVID in July 2021 (pt reports he is fully recovered). The following deficits were present during the exam:  impaired balance - SLS, stepping strategies, decr vestibular input for balance, decr B arm swing and stride length with gait, decr BLE strength, impaired coordination, bradykinesia. Pt and pt's wife reports that pt's gait gets worse with fatigue and pt has a tendency to trip over his feet at times. Pt works full time and does a lot of walking at his job. Pt also remains active -participates in PD cycle classes and  online rock steady boxing. Pt has not yet received PT after diagnosis of PD.  Based on miniBEST, pt is at an incr risk for falls.Pt would benefit from skilled PT to address these impairments and functional limitations to maximize functional mobility independence  Personal Factors and Comorbidities Comorbidity 3+  Comorbidities PD, hx of neck pain, chronic tinnitus, anxiety.  Examination-Activity Limitations Stand;Locomotion Level;Stairs  Examination-Participation Restrictions Community Activity;Occupation  Pt will benefit from skilled therapeutic intervention in order to improve on the following deficits Abnormal gait;Decreased balance;Decreased activity tolerance;Difficulty walking;Decreased strength;Decreased coordination  Stability/Clinical Decision Making Stable/Uncomplicated  Clinical Decision Making Low  Rehab Potential Excellent  PT Frequency 2x / week  PT Duration 8 weeks  PT Treatment/Interventions ADLs/Self Care Home Management;Stair training;Gait training;Functional mobility training;Therapeutic activities;Balance training;Therapeutic exercise;Neuromuscular re-education;Patient/family education;Energy conservation  PT Next Visit Plan perform for walking endurance - write LTG as appropriate. standing PWR - give as HEP. gait training with incr stride length/arm swing. posterior and lateral stepping strategy work. pt is very active and does a lot of walking for his job.  Consulted and Agree with Plan of Care Patient;Family member/caregiver  Family Member Consulted pt's wife Mark [name]     Patient will benefit from skilled therapeutic intervention in order to improve the following deficits and impairments:     Visit Diagnosis: Unsteadiness on feet  Other abnormalities of gait and mobility  Difficulty in walking, not elsewhere classified     Problem List Patient Active Problem List   Diagnosis Date Noted  . Low testosterone 06/10/2019  . Neck pain 04/19/2017  . Anxiety state  04/28/2015  . Wellness examination 03/24/2015  . Acute pharyngitis 03/30/2014  . Intercostal muscle tear 06/20/2013  . Nonspecific abnormal electrocardiogram (ECG) (EKG) 03/08/2011  . GERD 11/14/2007  . ANXIETY STATE NOS 12/11/2006  . Hyperglycemia 09/06/2006    Drake Leach, PT, DPT 12/19/2019, 11:58 AM  [facility] [address] [place], [place], [address] Phone: [phone]   Fax:  [phone]  Name: Mark [name] MRN: [id number] Date of Birth: 08-16-1960

## 2019-12-19 NOTE — Therapy (Signed)
[facility] [address] [place], [place], [address] Phone: [phone]   Fax:  [phone]  Occupational Therapy Evaluation  Patient Details  Name: Mark [name] MRN: [id number] Date of Birth: May 22, 1960 Referring Provider (OT): Dr. Frances Furbish    Encounter Date: 12/19/2019   OT End of Session - 12/19/19 1414    Visit Number 1    Number of Visits 17    Date for OT Re-Evaluation 02/12/20   Authorization Type UHC    Authorization Time Period UHC 23 visit limit    Authorization - Visit Number 1    Authorization - Number of Visits 23    OT Start Time [phone]    OT Stop Time 0930    OT Time Calculation (min) 39 min           Past Medical History:  Diagnosis Date  . Family history of prostate cancer     DRE WNL; PSA never > 0.38  . Fasting hyperglycemia     PMH of  . GERD (gastroesophageal reflux disease)   . Tinnitus    w/o hearing loss    Past Surgical History:  Procedure Laterality Date  . COLONOSCOPY  05/2013   Neg, Dr Marina Goodell  . KNEE SURGERY Right   . TONSILLECTOMY AND ADENOIDECTOMY    . VASECTOMY      There were no vitals filed for this visit.   Subjective Assessment - 12/19/19 1450    Subjective  Denies pain    Pertinent History Pt is a 59 y.o male with recent diagnosis of Parkinson's disease, PMH significant for : neck pain, reflux disease, chronic tinnitus, anxiety, and allergic rhinitis, hx of COVID in July    Patient Stated Goals improve use of left arm/ hand    Currently in Pain? No/denies             [facility] OT Assessment - 12/19/19 0856      Assessment   Medical Diagnosis Parkinson's disease    Referring Provider (OT) Dr. 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    Leisure enjoys walking (used to be a runner), going to kid's sporting games/events, sometimes he tries to shoot basketball with his son, has a very active family      ADL   Eating/Feeding Modified independent   difficulty cutting food   Grooming Modified independent   difficulty using left hand for teeth , and trimming mustache   Upper Body Bathing Modified independent   decreased smooth movement whrn washing hair   Lower Body Bathing Modified independent    Upper Body Dressing Independent   difficulty with fasteners   Lower Body Dressing Modified independent   difficulty with donning compression socks   Toilet Transfer Independent    Tub/Shower Transfer Modified independent   stepping into shower    ADL comments increased difficulty with contacts, difficulty applying lotion, difficulty with opening containers and bottles  IADL   Light Housekeeping --   performs yardwork   Meal Prep Able to complete simple warm meal prep   grills, needs assist getting items out of oven   Medication Management Is responsible for taking medication in correct dosages at correct time    Financial Management --   wife handles     Mobility   Mobility Status Independent      Written Expression   Dominant Hand Right    Handwriting 90% legible   moderate moicrographia     Cognition   Overall Cognitive Status Cognition to be further assessed in functional context PRN    Awareness Impaired    Awareness Impairment --   decreased awareness of deficits     Observation/Other Assessments   Other Surveys  Select    Physical Performance Test   Yes    Simulated Eating Time (seconds) 17.75    Donning Doffing Jacket Comments 3 button/ unbutton 1 min 21 secs      Coordination   9 Hole Peg Test Right;Left     Right 9 Hole Peg Test 51.53    Left 9 Hole Peg Test 93 secs    Box and Blocks R 39, L 27    Tremors left hand greater than right hand      Tone   Assessment Location Right Upper Extremity;Left Upper Extremity      ROM / Strength   AROM / PROM / Strength AROM      AROM   Overall AROM  Deficits    Overall AROM Comments RUe shoulder flexion 135, elbow extension -20, LUE shoulder flexion 120, elbow extension -30 decreased bilateral supination, decreased LUE finger extension      RUE Tone   RUE Tone Mild   rigidity     LUE Tone   LUE Tone Moderate   rigidity                            OT Short Term Goals - 12/19/19 1428      OT SHORT TERM GOAL #1   Title I with PD specific HEP    Time 4    Period Weeks    Status New      OT SHORT TERM GOAL #2   Title Pt will verbalize understanding of adapted strategies to maximize safety and I with ADLs/ IADLs .(ie: opening containers, brushing teeth, using screwdriver/ hammer, cutting food)    Time 4    Period Weeks    Status New      OT SHORT TERM GOAL #3   Title Pt will demonstrate improved fine motor coordination for ADLs as evidenced by decreasing 9 hole peg test score for LUE to 75 secs or less    Baseline 93 secs    Time 4    Period Weeks    Status New      OT SHORT TERM GOAL #4   Title Pt will  demonstrate improved LUE functional use for ADLs as evidenced by increasing box/ blocks score by 4 blocks with LUE    Baseline RUE 39, LUE 27    Time 4    Period Weeks    Status New      OT SHORT TERM GOAL #5   Title Pt will write a short paragraph with 100% legibility and no significant decrease in letter size    Time 4    Period Weeks    Status New  OT SHORT TERM GOAL #6   Title check PPT#4 and add goal prn    Time 4    Period Weeks    Status New             OT Long Term Goals - 12/19/19 1434      OT LONG TERM GOAL #1   Title Pt will verbalize understanding of ways to prevent future PD related  complications and PD community resources.    Time 8    Period Weeks    Status New    Target Date 02/13/20      OT LONG TERM GOAL #2   Title Pt will demonstrate increased ease with self feeding as evidneced by decreasing PPT#2 to 14 secs or less    Baseline 17.75 secs    Time 8    Period Weeks    Status New      OT LONG TERM GOAL #3   Title Pt will demonstrate ability to retrieve a lightweight object at 125 shoulder flexion and -25 elbow extension with LUE    Baseline LUE shoulder flexion 120, elbow ext -20    Time 8    Period Weeks    Status New      OT LONG TERM GOAL #4   Title Pt will demonstrate improved ease with dressing as eveidenced by decreasing 3 button/ unbutton time to 70 secs or less    Baseline 81 secs    Time 8    Period Weeks    Status New      OT LONG TERM GOAL #5   Title Pt will demonstrate improved RUE fine motor coordination as eveidenced by decreasing 9 hole peg test score to 45 secs or less    Baseline RUE 51.53 secs, LUE 93 secs    Time 8    Period Weeks    Status New                 Plan - 12/19/19 1423    Clinical Impression Statement Pt is a 59 y.o male with recent diagnosis of Parkinson's disease, PMH significant for : neck pain, reflux disease, chronic tinnitus, anxiety, and allergic rhinitis, hx of COVID in July . Pt presents with the following deficits: bradykesia, decreased balance decreased coordination, rigidity, decreased ROM, decreased functional mobility and decreased awareness of deficits which impedes performance of ADLs/ IADLs. Pt can benefit from skilled occupational therapy to maximize pt's safety and I with daily activities. 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    OT Occupational Profile and History Detailed Assessment- Review of Records and additional review of physical, cognitive, psychosocial history related to current functional performance    Occupational performance deficits (Please refer to evaluation for  details): ADL's;IADL's;Leisure;Social Participation;Play;Work    Games developerBody Structure / Function / Physical Skills ADL;Endurance;UE functional use;Balance;Flexibility;FMC;ROM;Gait;Coordination;GMC;Decreased knowledge of precautions;Decreased knowledge of use of DME;IADL;Strength;Dexterity;Mobility;Tone    Cognitive Skills --   decreased awareness of deficits   Rehab Potential Good    Clinical Decision Making Limited treatment options, no task modification necessary    Comorbidities Affecting Occupational Performance: May have comorbidities impacting occupational performance    Modification or Assistance to Complete Evaluation  No modification of tasks or assist necessary to complete eval    OT Frequency 2x / week    OT Duration 8 weeks   plus eval or 17 visits total   OT Treatment/Interventions Self-care/ADL training;Ultrasound;Energy conservation;Patient/family education;Balance training;Passive range of motion;Gait Training;DME and/or AE instruction;Aquatic  Therapy;Fluidtherapy;Splinting;Functional Mobility Training;Moist Heat;Therapeutic exercise;Manual Therapy;Therapeutic activities;Cognitive remediation/compensation;Neuromuscular education;Cryotherapy    Plan initiate PWR! seated or supine, coorination HEP    Consulted and Agree with Plan of Care Patient;Family member/caregiver    Family Member Consulted wife           Patient will benefit from skilled therapeutic intervention in order to improve the following deficits and impairments:   Body Structure / Function / Physical Skills: ADL, Endurance, UE functional use, Balance, Flexibility, FMC, ROM, Gait, Coordination, GMC, Decreased knowledge of precautions, Decreased knowledge of use of DME, IADL, Strength, Dexterity, Mobility, Tone Cognitive Skills:  (decreased awareness of deficits)     Visit Diagnosis: Other lack of coordination - Plan: Ot plan of care cert/re-cert  Abnormal posture - Plan: Ot plan of care cert/re-cert  Other symptoms  and signs involving the musculoskeletal system - Plan: Ot plan of care cert/re-cert  Other symptoms and signs involving the nervous system - Plan: Ot plan of care cert/re-cert  Other abnormalities of gait and mobility - Plan: Ot plan of care cert/re-cert  Stiffness of left shoulder, not elsewhere classified  Stiffness of left elbow, not elsewhere classified    Problem List Patient Active Problem List   Diagnosis Date Noted  . Low testosterone 06/10/2019  . Neck pain 04/19/2017  . Anxiety state 04/28/2015  . Wellness examination 03/24/2015  . Acute pharyngitis 03/30/2014  . Intercostal muscle tear 06/20/2013  . Nonspecific abnormal electrocardiogram (ECG) (EKG) 03/08/2011  . GERD 11/14/2007  . ANXIETY STATE NOS 12/11/2006  . Hyperglycemia 09/06/2006    Mark [name] 12/19/2019, 2:55 PM Keene Breath, OTR/L [phone] Phone: [phone] 2:55 PM 12/19/19 [facility] [address] [place], [place], [address] Phone: [phone]   Fax:  [phone]  Name: Mark [name] MRN: [id number] Date of Birth: 1960-07-16

## 2019-12-20 ENCOUNTER — Telehealth: Payer: Self-pay

## 2019-12-20 DIAGNOSIS — R498 Other voice and resonance disorders: Secondary | ICD-10-CM

## 2019-12-20 DIAGNOSIS — R1310 Dysphagia, unspecified: Secondary | ICD-10-CM

## 2019-12-20 DIAGNOSIS — G20 Parkinson's disease: Secondary | ICD-10-CM

## 2019-12-20 NOTE — Therapy (Addendum)
[facility] [address] [place], [place], [address] Phone: [phone]   Fax:  [phone]  Speech Language Pathology Evaluation  Patient Details  Name: Mark [name] MRN: [id number] Date of Birth: [birth year] Referring Provider (SLP): Huston Foley, MD   Encounter Date: 12/19/2019   End of Session - 12/20/19 1226    Visit Number 1    Number of Visits 17    Date for SLP Re-Evaluation 03/25/20    SLP Start Time 1020    SLP Stop Time  1100    SLP Time Calculation (min) 40 min    Activity Tolerance Patient tolerated treatment well           Past Medical History:  Diagnosis Date  . Family history of prostate cancer     DRE WNL; PSA never > 0.38  . Fasting hyperglycemia     PMH of  . GERD (gastroesophageal reflux disease)   . Tinnitus    w/o hearing loss    Past Surgical History:  Procedure Laterality Date  . COLONOSCOPY  05/2013   Neg, Dr Marina Goodell  . KNEE SURGERY Right   . TONSILLECTOMY AND ADENOIDECTOMY    . VASECTOMY      There were no vitals filed for this visit.       SLP Evaluation [facility] - 12/20/19 0001      SLP Visit Information   SLP Received On 12/19/19    Referring Provider (SLP) Huston Foley, MD    Onset Date late 2019    Medical Diagnosis Parkinson's disease      Subjective   Patient/Family Stated Goal "I'd like to get the drooling under control. And muy speech, the same volume level instead of tapering off."      General Information   HPI Late 2019 pt began with symptoms of PD, began PT in 2020 at a [facility] (not [facility]). 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       Prior Functional Status   Cognitive/Linguistic Baseline Within functional limits      Auditory Comprehension   Overall Auditory Comprehension Appears within functional limits for tasks assessed       Verbal Expression   Overall Verbal Expression Appears within functional limits for tasks assessed      Oral Motor/Sensory Function   Overall Oral Motor/Sensory Function Impaired    Labial ROM --   better with visual cues   Labial Coordination Reduced   slight   Lingual ROM Within Functional Limits   better with visual cues   Lingual Coordination Reduced    Facial ROM Within Functional Limits      Motor Speech   Overall Motor Speech Impaired    Phonation Low vocal intensity   upper 60s dB 6 minute monologue;WNL<3-4 min   Effective Techniques Increased vocal intensity   after SLP cues, average was closer to [facility]                          SLP Education - 12/20/19 1210    Education Details sLP recommmends MBSS/FEES mainly for baseline, recommend effortful swallow 15x BID, possible goals for loudness, try gum or lozenge for saliva control/decr'd drooling    Person(s) Educated Patient    Methods Explanation    Comprehension Verbalized understanding;Returned demonstration  SLP Short Term Goals - 12/20/19 1232      SLP SHORT TERM GOAL #1   Title pt will maintain WNL volume for 10 minutes simple conversation x2 sessions    Time 4    Period Weeks    Status New      SLP SHORT TERM GOAL #2   Title pt will produce "ah" with at least 90dB average for 3 sessions    Time 4    Period Weeks    Status New      SLP SHORT TERM GOAL #3   Title pt will undergo objective swallow eval    Time 3    Period Weeks    Status New      SLP SHORT TERM GOAL #4   Title pt will demo articulatory HEP for speech compensations (slowed rate, overarticulation, etc) in 3 sessions    Time 4    Period Weeks    Status New      SLP SHORT TERM GOAL #5   Title pt will demo abdominal breathing in 10 minutes simple conversation in 2 sessions    Time 4    Period Weeks    Status New            SLP Long Term Goals - 12/20/19 1235      SLP LONG TERM GOAL #1   Title pt will  engage in 15 minutes mod complex conversation with average in low 70s dB in 3 sessions    Time 8    Period Weeks   or 17 sessions, for all LTGS   Status New      SLP LONG TERM GOAL #2   Title pt will produce loud "ah" with average low 90s dB in 6 sessions    Time 8    Period Weeks    Status New      SLP LONG TERM GOAL #3   Title pt will demo <3 rushes of speech which detract listener from pt's message in 15 minutes mod complex conversation in 3 sessions    Time 8    Period Weeks    Status New      SLP LONG TERM GOAL #4   Title pt will demo abdominal breathing 75% in 15 minutes mod complex conversation in 3 sessions    Time 8    Period Weeks    Status New            Plan - 12/20/19 1227    Clinical Impression Statement Pt presents today with 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 Additionally Mark [name] demonstrated short rushes of speech occasionally which did not deteriorate pt's overall message. Mark [name]'s wife Mark [name] reports more than expected frequency of coughing with eating/drinking. SLP suggests objective swallow eval during this therapy course (MBSS, FEES). Pt talked at suboptimal dB (upper 60s dB average after 3-4 minutes) until SLP told pt of his softer speech then pt incr'd loudness to WNL average more frequently. Mark [name] tells SLP that his decr'd volume and rushes of speech have  negatively affected his communcation at work, and he has noticed his family telling him more often that they cannot understand him. SLP believes pt will benefit from skilled ST targeting incr'd volume and greater usage of abdomoinal breathing in order to improve communicative effectiveness    Speech Therapy Frequency 2x / week    Duration 8 weeks   or 17 sessions total   Treatment/Interventions Aspiration precaution training;Pharyngeal strengthening exercises;Diet toleration management by  SLP;Trials of upgraded texture/liquids;Cueing hierarchy;Internal/external aids;Patient/family  education;Compensatory strategies;SLP instruction and feedback;Functional tasks;Environmental controls    Potential to Achieve Goals Good           Patient will benefit from skilled therapeutic intervention in order to improve the following deficits and impairments:   Dysarthria and anarthria - Plan: SLP plan of care cert/re-cert  Dysphagia, oropharyngeal phase - Plan: SLP plan of care cert/re-cert    Problem List Patient Active Problem List   Diagnosis Date Noted  . Low testosterone 06/10/2019  . Neck pain 04/19/2017  . Anxiety state 04/28/2015  . Wellness examination 03/24/2015  . Acute pharyngitis 03/30/2014  . Intercostal muscle tear 06/20/2013  . Nonspecific abnormal electrocardiogram (ECG) (EKG) 03/08/2011  . GERD 11/14/2007  . ANXIETY STATE NOS 12/11/2006  . Hyperglycemia 09/06/2006    Krystyne Tewksbury ,MS, CCC-SLP  12/20/2019, 12:45 PM  [facility] [address] [place], [place], [address] Phone: [phone]   Fax:  [phone]  Name: Izan Miron MRN: [id number] Date of Birth: 1960-07-24

## 2019-12-20 NOTE — Telephone Encounter (Signed)
Dr. Frances Furbish - I recommend swallow evaluation (modified barium swallow exam -MBSS, or fiberendoscopic eval of swallowing- FEES) for pt, due to wife and pt reports of regular coughing with meals. This could also serve as a baseline measure for his swallow function.   Thank you for considering. Please contact me with questions.  Verdie Mosher, MS, CCC-SLP  [facility] [phone]

## 2019-12-21 NOTE — Addendum Note (Signed)
Addended by: Drake Leach on: 12/21/2019 08:36 PM   Modules accepted: Orders

## 2019-12-22 NOTE — Telephone Encounter (Signed)
Referral placed for MBSS as per ST recs.

## 2019-12-24 ENCOUNTER — Other Ambulatory Visit: Payer: Self-pay

## 2019-12-24 ENCOUNTER — Ambulatory Visit: Payer: 59 | Admitting: Physical Therapy

## 2019-12-24 DIAGNOSIS — R2689 Other abnormalities of gait and mobility: Secondary | ICD-10-CM

## 2019-12-24 DIAGNOSIS — R278 Other lack of coordination: Secondary | ICD-10-CM | POA: Diagnosis not present

## 2019-12-24 DIAGNOSIS — R2681 Unsteadiness on feet: Secondary | ICD-10-CM

## 2019-12-24 NOTE — Therapy (Signed)
[facility] [address] [place], [place], [address] Phone: [phone]   Fax:  [phone]  Physical Therapy Treatment  Patient Details  Name: Mark [name] MRN: [id number] Date of Birth: 01/23/1961 Referring Provider (PT): Huston Foley, MD   Encounter Date: 12/24/2019   PT End of Session - 12/24/19 1843    Visit Number 2    Number of Visits 17    Date for PT Re-Evaluation 03/18/20   written for 60 day POC   Authorization Type UC - 23 visit limit PT, 23 visit limit OT, 30 visit limit ST    Authorization - Number of Visits 30    PT Start Time 1747    PT Stop Time 1831    PT Time Calculation (min) 44 min    Equipment Utilized During Treatment --    Activity Tolerance Patient tolerated treatment well    Behavior During Therapy [facility] for tasks assessed/performed           Past Medical History:  Diagnosis Date  . Family history of prostate cancer     DRE WNL; PSA never > 0.38  . Fasting hyperglycemia     PMH of  . GERD (gastroesophageal reflux disease)   . Tinnitus    w/o hearing loss    Past Surgical History:  Procedure Laterality Date  . COLONOSCOPY  05/2013   Neg, Dr Marina Goodell  . KNEE SURGERY Right   . TONSILLECTOMY AND ADENOIDECTOMY    . VASECTOMY      There were no vitals filed for this visit.   Subjective Assessment - 12/24/19 1752    Subjective Has gone back up from 1 pill to 1 1/2 pill of Ropinerole, today.  Really want to work on the stiffness.    Patient is accompained by: Family member   wife, Mark [name]   Pertinent History neck pain, reflux disease, chronic tinnitus, anxiety, and allergic rhinitis, hx of COVID in July    Patient Stated Goals wants to get his L arm a little more flexible, improve his balance.    Currently in Pain? No/denies   Has had some neck pain, but not today.             [facility] PT Assessment - 12/24/19 0001      6 Minute Walk- Baseline   6 Minute Walk- Baseline yes    BP  (mmHg) 134/83    HR (bpm) 77    02 Sat (%RA) 98 %      6 Minute walk- Post Test   6 Minute Walk Post Test yes    BP (mmHg) 134/79    HR (bpm) 79    02 Sat (%RA) 98 %    Modified Borg Scale for Dyspnea 0- Nothing at all    Perceived Rate of Exertion (Borg) 12-      6 minute walk test results    Aerobic Endurance Distance Walked 1345   1875 ft    Endurance additional comments 225 ft 1st minute, 220 ft 6th minute                         [facility] Adult PT Treatment/Exercise - 12/24/19 0001      Transfers   Transfers Sit to Stand;Stand to Sit    Sit to Stand 5: Supervision;Without upper extremity assist;From bed;Other (comment)   from low surface simulating sofa   Sit to Stand Details Verbal cues for sequencing;Verbal cues for technique  Sit to Stand Details (indicate cue type and reason) Cues for scooting, foot position and increased forward lean.    Stand to Sit 5: Supervision;Without upper extremity assist;To bed;Other (comment)   low surface, simulating sofa   Comments Wife reports pt has difficulty in the late evenings after work, with transfers.  Worked to simulate sit<>stand from sofa, with cues for technique for ease of transfers      Ambulation/Gait   Ambulation/Gait Yes    Ambulation/Gait Assistance 5: Supervision;4: Min assist    Ambulation/Gait Assistance Details Min Assist provided at shoulders to assist with trunk rotation, at LUE to assist with L arm swing.  Pt very stiff with gait activities.  Able to improve R arm swing and step length, still with decreased L arm swing.    Ambulation Distance (Feet) 345 Feet    Assistive device None    Gait Pattern Decreased arm swing - left;Decreased arm swing - right;Step-through pattern;Decreased stride length;Decreased trunk rotation    Ambulation Surface Level;Indoor            Pt performs PWR! Moves in standing position    PWR! Up for improved posture, x 3 initial reps, then x 10 reps-cues for extended elbow,  open hand on L  PWR! Rock for improved weighshifting x 10 reps each side-cues for extended elbow, open hand on L  PWR! Twist for improved trunk rotation x 5 reps each side  PWR! Step for improved step initiation x 10 reps each side-cues for step height and step length on L  Visual, verbal, tactile cues provided for technique and positioning and for optimal intensity/amplitude of movement patterns. Explained rationale of each move in relation to functional mobility activities.  Explained rationale of larger movement patterns, feeling exaggerated on L side, to produce the right amount of movement.         PT Education - 12/24/19 1841    Education Details Sit<>stand technique    Person(s) Educated Patient;Spouse    Methods Explanation;Demonstration;Handout    Comprehension Verbalized understanding;Returned demonstration            PT Short Term Goals - 12/24/19 1848      PT SHORT TERM GOAL #1   Title Pt will be independent with initial HEP in order to build upon functional gains made in therapy. ALL STGS DUE 12/21/19    Time 4    Period Weeks    Status New      PT SHORT TERM GOAL #2   Title Pt will undergo with LTG to be written as appropriate.    Baseline 1345 ft 12/24/2019    Time 4    Period Weeks    Status Achieved      PT SHORT TERM GOAL #3   Title Pt will recover posterior balance in push and release test in 1 step independently, for improved balance recovery    Time 4    Period Weeks    Status New      PT SHORT TERM GOAL #4   Title Pt will decr 5x sit <> stand time to 12.5 seconds or less in order to demo improved functional BLE strength and transfer efficiency.    Baseline 15.09 seconds with no UE support.    Time 4    Period Weeks    Status New             PT Long Term Goals - 12/24/19 1848      PT LONG TERM GOAL #1  Title Pt will be independent with final HEP. ALL LTGS DUE 02/15/20    Time 8    Period Weeks    Status New      PT LONG TERM  GOAL #2   Title Pt will verbalize understanding of local Parkinson's disease resources, including options for continue community fitness.    Time 8    Period Weeks    Status New      PT LONG TERM GOAL #3   Title Pt will improve miniBEST to at least a 25/28 to demo improved balance and decr fall risk.    Baseline 22/28    Time 8    Period Weeks    Status New      PT LONG TERM GOAL #4   Title 6MWT to improve to at least 1450 ft to demo improved walking endurance and gait efficiency for pt's job.    Baseline 1345 ft 12/24/2019    Time 8    Period Weeks    Status Revised      PT LONG TERM GOAL #5   Title Pt will perform 12 steps with step through pattern and single handrail vs. no handrail with mod I for improved stair negotiation in the house.    Time 8    Period Weeks    Status New                 Plan - 12/24/19 1844    Clinical Impression Statement 6MWT performed this visit, with pt ambulating 1345 ft, which is less than age-related norms of 1865 ft; goal set accordingly.  Initiated standing PWR! Moves in session today, with pt having good initial understanding and performance with cues.  Would like patient to do them once more in therapy session to ensure optimal technique prior to initiating as HEP.  Addressed sit to stand and began gait training, but LUE and L trunk ridigity noted during giat.    Personal Factors and Comorbidities Comorbidity 3+    Comorbidities PD, hx of neck pain, chronic tinnitus, anxiety.    Examination-Activity Limitations Stand;Locomotion Level;Stairs    Examination-Participation Restrictions Community Activity;Occupation    Stability/Clinical Decision Making Stable/Uncomplicated    Rehab Potential Excellent    PT Frequency 2x / week    PT Duration 8 weeks    PT Treatment/Interventions ADLs/Self Care Home Management;Stair training;Gait training;Functional mobility training;Therapeutic activities;Balance training;Therapeutic exercise;Neuromuscular  re-education;Patient/family education;Energy conservation    PT Next Visit Plan Perform standing PWR! again and give as HEP. gait training with incr stride length/arm swing. posterior and lateral stepping strategy work. May consider supine PWR! Moves (if OT doesn't get to them first) as well    Consulted and Agree with Plan of Care Patient;Family member/caregiver    Family Member Consulted pt's wife Mark [name]           Patient will benefit from skilled therapeutic intervention in order to improve the following deficits and impairments:  Abnormal gait, Decreased balance, Decreased activity tolerance, Difficulty walking, Decreased strength, Decreased coordination  Visit Diagnosis: Other abnormalities of gait and mobility  Unsteadiness on feet     Problem List Patient Active Problem List   Diagnosis Date Noted  . Low testosterone 06/10/2019  . Neck pain 04/19/2017  . Anxiety state 04/28/2015  . Wellness examination 03/24/2015  . Acute pharyngitis 03/30/2014  . Intercostal muscle tear 06/20/2013  . Nonspecific abnormal electrocardiogram (ECG) (EKG) 03/08/2011  . GERD 11/14/2007  . ANXIETY STATE NOS 12/11/2006  . Hyperglycemia 09/06/2006  Mark [name] W. 12/24/2019, 6:51 PM  Gean Maidens., PT   [place] [facility] [address] [place], [place], [address] Phone: [phone]   Fax:  [phone]  Name: Mark [name] MRN: [id number] Date of Birth: 1960/11/17

## 2019-12-24 NOTE — Patient Instructions (Signed)
Sit to Stand Transfers:  1. Scoot out to the edge of the chair 2. Place your feet flat on the floor, shoulder width apart.  Make sure your feet are tucked just under your knees. 3. Lean forward (nose over toes) with momentum, and stand up tall with your best posture.  If you need to use your arms, use them as a quick boost up to stand. 4. If you are in a low or soft chair, you can lean back and then forward up to stand, in order to get more momentum. 5. Once you are standing, make sure you are looking ahead and standing tall.  To sit down:  1. Back up until you feel the chair behind your legs. 2. Bend at you hips, reaching  Back for you chair, if needed, then slowly squat to sit down on your chair. 

## 2019-12-24 NOTE — Telephone Encounter (Signed)
Orders have been sent for swallow Study . Thanks Annabelle Harman

## 2019-12-26 ENCOUNTER — Encounter: Payer: Self-pay | Admitting: Physical Therapy

## 2019-12-26 ENCOUNTER — Other Ambulatory Visit: Payer: Self-pay

## 2019-12-26 ENCOUNTER — Ambulatory Visit: Payer: 59 | Admitting: Physical Therapy

## 2019-12-26 ENCOUNTER — Ambulatory Visit: Payer: 59

## 2019-12-26 ENCOUNTER — Ambulatory Visit: Payer: 59 | Admitting: Medical

## 2019-12-26 VITALS — BP 141/85 | HR 75 | Temp 98.2°F | Resp 20 | Ht 73.0 in | Wt 173.8 lb

## 2019-12-26 DIAGNOSIS — R1312 Dysphagia, oropharyngeal phase: Secondary | ICD-10-CM

## 2019-12-26 DIAGNOSIS — R5383 Other fatigue: Secondary | ICD-10-CM | POA: Diagnosis not present

## 2019-12-26 DIAGNOSIS — G20 Parkinson's disease: Secondary | ICD-10-CM

## 2019-12-26 DIAGNOSIS — E611 Iron deficiency: Secondary | ICD-10-CM | POA: Diagnosis not present

## 2019-12-26 DIAGNOSIS — R471 Dysarthria and anarthria: Secondary | ICD-10-CM

## 2019-12-26 DIAGNOSIS — Z79899 Other long term (current) drug therapy: Secondary | ICD-10-CM

## 2019-12-26 DIAGNOSIS — F419 Anxiety disorder, unspecified: Secondary | ICD-10-CM

## 2019-12-26 DIAGNOSIS — R293 Abnormal posture: Secondary | ICD-10-CM

## 2019-12-26 DIAGNOSIS — R278 Other lack of coordination: Secondary | ICD-10-CM | POA: Diagnosis not present

## 2019-12-26 DIAGNOSIS — R262 Difficulty in walking, not elsewhere classified: Secondary | ICD-10-CM

## 2019-12-26 DIAGNOSIS — G20A1 Parkinson's disease without dyskinesia, without mention of fluctuations: Secondary | ICD-10-CM

## 2019-12-26 DIAGNOSIS — R2681 Unsteadiness on feet: Secondary | ICD-10-CM

## 2019-12-26 DIAGNOSIS — R2689 Other abnormalities of gait and mobility: Secondary | ICD-10-CM

## 2019-12-26 DIAGNOSIS — R498 Other voice and resonance disorders: Secondary | ICD-10-CM

## 2019-12-26 MED ORDER — LORAZEPAM 0.5 MG PO TABS: ORAL_TABLET | ORAL | 2 refills | Status: DC

## 2019-12-26 NOTE — Patient Instructions (Signed)
   Practice reading out loud for 7 mintues, in 7 1-minute increments  Do the handouts for 10-15 minutes after that

## 2019-12-26 NOTE — Therapy (Signed)
[facility] [address] [place], [place], [address] Phone: [phone]   Fax:  [phone]  Physical Therapy Treatment  Patient Details  Name: Mark [name] MRN: [id number] Date of Birth: 1960-11-06 Referring Provider (PT): Huston Foley, MD   Encounter Date: 12/26/2019   PT End of Session - 12/26/19 0806    Visit Number 3    Number of Visits 17    Date for PT Re-Evaluation 03/18/20   written for 60 day POC   Authorization Type UC - 23 visit limit PT, 23 visit limit OT, 30 visit limit ST    Authorization - Visit Number 2    Authorization - Number of Visits 23    PT Start Time [phone]   pt running late due to traffic   PT Stop Time 0801    PT Time Calculation (min) 28 min    Activity Tolerance Patient tolerated treatment well    Behavior During Therapy [facility] for tasks assessed/performed           Past Medical History:  Diagnosis Date   Family history of prostate cancer     DRE WNL; PSA never > 0.38   Fasting hyperglycemia     PMH of   GERD (gastroesophageal reflux disease)    Tinnitus    w/o hearing loss    Past Surgical History:  Procedure Laterality Date   COLONOSCOPY  05/2013   Neg, Dr Marina Goodell   KNEE SURGERY Right    TONSILLECTOMY AND ADENOIDECTOMY     VASECTOMY      There were no vitals filed for this visit.   Subjective Assessment - 12/26/19 0734    Subjective [place] Feeling a little bit more stiff today - normally loosens up as the day goes on.    Patient is accompained by: Family member   wife, [name]   Pertinent History neck pain, reflux disease, chronic tinnitus, anxiety, and allergic rhinitis, hx of COVID in July    Patient Stated Goals wants to get his L arm a little more flexible, improve his balance.    Currently in Pain? No/denies                   NMR:     Pt performs PWR! Moves in standing position    PWR! Up for improved posture, x 3  initial reps for increased stretch, then x 10 reps-cues for scapular retraction and big hands   PWR! Rock for improved weighshifting x 10 reps each side-cues for open hand on L and looking up at hand   PWR! Twist for improved trunk rotation x 10 reps each side  PWR! Step for improved step initiation x 10 reps each side-cues for step height and step length on L  Visual, and verbal cues provided for technique and positioning and for optimal intensity/amplitude of movement patterns. Dicussed working at a 7-8/10 level of intensity at home.      Pt reporting feeling incr neck stiffness, esp when seated at his son's basketball game and asking for anything he can do for this. Discussed in sitting, performing a tall PWR Up posture and once seated tall performing scapular retractions and holds for 3 seconds, performing x10 reps. Verbal and tactile cues for technique.       [facility] Adult PT Treatment/Exercise - 12/26/19 0001      Ambulation/Gait   Ambulation/Gait Yes    Ambulation/Gait Assistance 5: Supervision    Ambulation/Gait Assistance  Details first 3 laps performed with therapist having B walking poles to help facilitate arm swing and trunk rotation, then removed walking poles with pt able to maintain arm swing with RUE, for LUE needing verbal and tactile cues to maintain arm swing, esp in posterior direction    Ambulation Distance (Feet) 345 Feet   x1, 230 x 2   Assistive device None;Other (Comment)   B walking poles (therapist facilitation)   Gait Pattern Decreased arm swing - left;Decreased arm swing - right;Step-through pattern;Decreased stride length;Decreased trunk rotation    Ambulation Surface Level;Indoor      Posture/Postural Control   Posture/Postural Control Postural limitations    Postural Limitations Forward head;Rounded Shoulders                  PT Education - 12/26/19 0806    Education Details standing PWR HEP    Person(s) Educated Patient;Spouse    Methods  Explanation;Demonstration;Handout    Comprehension Verbalized understanding;Returned demonstration            PT Short Term Goals - 12/24/19 1848      PT SHORT TERM GOAL #1   Title Pt will be independent with initial HEP in order to build upon functional gains made in therapy. ALL STGS DUE 12/21/19    Time 4    Period Weeks    Status New      PT SHORT TERM GOAL #2   Title Pt will undergo with LTG to be written as appropriate.    Baseline 1345 ft 12/24/2019    Time 4    Period Weeks    Status Achieved      PT SHORT TERM GOAL #3   Title Pt will recover posterior balance in push and release test in 1 step independently, for improved balance recovery    Time 4    Period Weeks    Status New      PT SHORT TERM GOAL #4   Title Pt will decr 5x sit <> stand time to 12.5 seconds or less in order to demo improved functional BLE strength and transfer efficiency.    Baseline 15.09 seconds with no UE support.    Time 4    Period Weeks    Status New             PT Long Term Goals - 12/24/19 1848      PT LONG TERM GOAL #1   Title Pt will be independent with final HEP. ALL LTGS DUE 02/15/20    Time 8    Period Weeks    Status New      PT LONG TERM GOAL #2   Title Pt will verbalize understanding of local Parkinson's disease resources, including options for continue community fitness.    Time 8    Period Weeks    Status New      PT LONG TERM GOAL #3   Title Pt will improve miniBEST to at least a 25/28 to demo improved balance and decr fall risk.    Baseline 22/28    Time 8    Period Weeks    Status New      PT LONG TERM GOAL #4   Title to improve to at least 1450 ft to demo improved walking endurance and gait efficiency for pt's job.    Baseline 1345 ft 12/24/2019    Time 8    Period Weeks    Status Revised      PT  LONG TERM GOAL #5   Title Pt will perform 12 steps with step through pattern and single handrail vs. no handrail with mod I for improved stair  negotiation in the house.    Time 8    Period Weeks    Status New                 Plan - 12/26/19 0819    Clinical Impression Statement Initiated standing PWR moves for HEP today and provided handout. Also worked on [employer], operational with walking poles (with therapist facilitating arm swing and trunk rotation), with pt with significantly improved RUE arm swing, but still needing tactile/verbal cues for LUE arm swing with poles removed due to incr stiffness/rigidity. Will continue to progress towards LTGs.    Personal Factors and Comorbidities Comorbidity 3+    Comorbidities PD, hx of neck pain, chronic tinnitus, anxiety.    Examination-Activity Limitations Stand;Locomotion Level;Stairs    Examination-Participation Restrictions Community Activity;Occupation    Stability/Clinical Decision Making Stable/Uncomplicated    Rehab Potential Excellent    PT Frequency 2x / week    PT Duration 8 weeks    PT Treatment/Interventions ADLs/Self Care Home Management;Stair training;Gait training;Functional mobility training;Therapeutic activities;Balance training;Therapeutic exercise;Neuromuscular re-education;Patient/family education;Energy conservation    PT Next Visit Plan how are PWR moves? staggered stance weight shift. gait training with incr stride length/arm swing. posterior and lateral stepping strategy work. May consider supine PWR! Moves (if OT doesn't get to them first) as well    PT Home Exercise Plan standing PWR    Consulted and Agree with Plan of Care Patient;Family member/caregiver    Family Member Consulted pt's wife [name]           Patient will benefit from skilled therapeutic intervention in order to improve the following deficits and impairments:  Abnormal gait, Decreased balance, Decreased activity tolerance, Difficulty walking, Decreased strength, Decreased coordination  Visit Diagnosis: Other abnormalities of gait and mobility  Abnormal posture  Unsteadiness on  feet  Difficulty in walking, not elsewhere classified     Problem List Patient Active Problem List   Diagnosis Date Noted   Low testosterone 06/10/2019   Neck pain 04/19/2017   Anxiety state 04/28/2015   Wellness examination 03/24/2015   Acute pharyngitis 03/30/2014   Intercostal muscle tear 06/20/2013   Nonspecific abnormal electrocardiogram (ECG) (EKG) 03/08/2011   GERD 11/14/2007   ANXIETY STATE NOS 12/11/2006   Hyperglycemia 09/06/2006    Drake Leach, PT, DPT  12/26/2019, 8:21 AM   [facility] [address] [place], [place], [address] Phone: [phone]   Fax:  [phone]  Name: Elba Dendinger MRN: [id number] Date of Birth: May 29, 1960

## 2019-12-26 NOTE — Progress Notes (Signed)
Subjective:    Patient ID: Mark [name], male    DOB: [birth year], 59 y.o.   MRN: [id number]  HPI  Pt in for follow up.  Pt states work is ok but challenge due to not able to rest. He walks about 3 miles a day. He is short staffed at work.  Pt has Parkinsons. 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 Pt has 3 employees underneath him. 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  Often times by Thursday he describes feels exhausted.  Pt has history of anxiety. Doing well ativan. He ran out today. He sometimes use up to three times daily.  Pt specialist did rx low dose lexapro. Sometimes seem to make him drowsy.  Pt has had low iron in the past. He is tatigued but feels more energy since starting iron. Was not anemic. Pt had normal conoscopy in 2015. Told to repeat in 2025.   For fatigue also had normal tsh, t4, b12, b1 and vit D. Tesosoteon level was normal as well.    Review of Systems  Constitutional: Positive for fatigue. Negative for chills and fever.  Respiratory: Negative for chest tightness and shortness of breath.   Cardiovascular: Negative for chest pain and palpitations.  Gastrointestinal: Negative for abdominal pain.  Musculoskeletal: Negative for back pain, gait problem and myalgias.  Neurological: Negative for dizziness, weakness, light-headedness and headaches.  Hematological: Negative for adenopathy. Does not bruise/bleed easily.  Psychiatric/Behavioral: Negative for behavioral problems, confusion, dysphoric mood, sleep disturbance and suicidal ideas. The patient is nervous/anxious.     Past Medical History:  Diagnosis Date  . Family history of prostate cancer     DRE WNL; PSA never > 0.38  . Fasting hyperglycemia     PMH of  . GERD (gastroesophageal reflux disease)   . Tinnitus    w/o hearing loss     Social History   Socioeconomic History  . Marital status: Married    Spouse name: Not on file  . Number of children: Not  on file  . Years of education: Not on file  . Highest education level: Not on file  Occupational History  . Not on file  Tobacco Use  . Smoking status: Never Smoker  . Smokeless tobacco: Never Used  Substance and Sexual Activity  . Alcohol use: Yes    Comment: very rare wine , < one per month per pt.  . Drug use: No  . Sexual activity: Yes  Other Topics Concern  . Not on file  Social History Narrative  . Not on file   Social Determinants of Health   Financial Resource Strain:   . Difficulty of Paying Living Expenses: Not on file  Food Insecurity:   . Worried About Programme researcher, broadcasting/film/video in the Last Year: Not on file  . Ran Out of Food in the Last Year: Not on file  Transportation Needs:   . Lack of Transportation (Medical): Not on file  . Lack of Transportation (Non-Medical): Not on file  Physical Activity:   . Days of Exercise per Week: Not on file  . Minutes of Exercise per Session: Not on file  Stress:   . Feeling of Stress : Not on file  Social Connections:   . Frequency of Communication with Friends and Family: Not on file  . Frequency of Social Gatherings with Friends and Family: Not on file  . Attends Religious Services: Not on file  . Active Member of Clubs  or Organizations: Not on file  . Attends Banker Meetings: Not on file  . Marital Status: Not on file  Intimate Partner Violence:   . Fear of Current or Ex-Partner: Not on file  . Emotionally Abused: Not on file  . Physically Abused: Not on file  . Sexually Abused: Not on file    Past Surgical History:  Procedure Laterality Date  . COLONOSCOPY  05/2013   Neg, Dr Marina Goodell  . KNEE SURGERY Right   . TONSILLECTOMY AND ADENOIDECTOMY    . VASECTOMY      Family History  Problem Relation Age of Onset  . COPD Mother        smoker  . Prostate cancer Father 65       no longer with prostate cancer  . Breast cancer Sister   . COPD Paternal Aunt        smoker  . Stroke Maternal Grandfather          late 40s  . Heart disease Neg Hx   . Colon cancer Neg Hx   . Esophageal cancer Neg Hx   . Pancreatic cancer Neg Hx   . Rectal cancer Neg Hx   . Stomach cancer Neg Hx   . Diabetes Neg Hx   . Other Neg Hx        low testosterone    Allergies  Allergen Reactions  . Penicillins     ? Reaction @ age 58    Current Outpatient Medications on File Prior to Visit  Medication Sig Dispense Refill  . cyclobenzaprine (FLEXERIL) 10 MG tablet TAKE ONE TABLET BY MOUTH EVERY NIGHT AT BEDTIME AS NEEDED FOR MUSCLE SPASMS 30 tablet 3  . escitalopram (LEXAPRO) 5 MG tablet Take 1 tablet (5 mg total) by mouth at bedtime. 30 tablet 5  . ferrous sulfate 324 (65 Fe) MG TBEC 1 tab po bid 60 tablet 0  . Multiple Vitamins-Minerals (MENS MULTIVITAMIN PLUS) TABS Take by mouth daily.    Marland Kitchen rOPINIRole (REQUIP) 1 MG tablet Start half a pill 3 times daily for 1 week, then 1 pill 3 times daily for 1 week, then 2 pills 3 times daily thereafter. 180 tablet 3  . clomiPHENE (CLOMID) 50 MG tablet 1/4 tab daily (Patient not taking: Reported on 12/19/2019) 8 tablet 11  . rotigotine (NEUPRO) 2 MG/24HR Place 1 patch onto the skin daily. Tapering dose, part of multiple Rx (Patient not taking: Reported on 12/19/2019) 7 patch 0  . rotigotine (NEUPRO) 4 MG/24HR Place 1 patch onto the skin daily. Tapering off, therefore multiple Rx (Patient not taking: Reported on 12/19/2019) 7 patch 0  . rotigotine (NEUPRO) 6 MG/24HR Place 1 patch onto the skin daily. (Patient not taking: Reported on 12/19/2019) 90 patch 3  . tadalafil (CIALIS) 5 MG tablet Take 1 tablet (5 mg total) by mouth daily. (Patient not taking: Reported on 12/19/2019) 30 tablet 11   No current facility-administered medications on file prior to visit.    BP (!) 141/85   Pulse 75   Temp 98.2 F (36.8 C) (Oral)   Resp 20   Ht 6\' 1"  (1.854 m)   Wt 173 lb 12.8 oz (78.8 kg)   SpO2 100%   BMI 22.93 kg/m       Objective:   Physical Exam  General- No acute distress.  Pleasant patient. Neck- Full range of motion, no jvd Lungs- Clear, even and unlabored. Heart- regular rate and rhythm. Neurologic- CNII- XII grossly intact.  Assessment & Plan:  For parkinson's with fatigue on episodes of fatgue want to offer filling out fmla forms in the event you find need time off on occasion. Can check with your HR and I will out forms if you need. Continue to follow up with neurologist as well.  For fatigue recommend continue iron and will recheck iron level and cbc.   For anxiety refilling your ativan to three times daily as needed. uds Advertising account planner.  Follow up 4 months or as needed  Time spent with patient today was 30  minutes which consisted of chart revdiew, discussing diagnosis, work up treatment and documentation.

## 2019-12-26 NOTE — Patient Instructions (Addendum)
For parkinson's with fatigue on episodes of fatgue want to offer filling out fmla forms in the event you find need time off on occasion. Can check with your HR and I will out forms if you need. Continue to follow up with neurologist as well.  For fatigue recommend continue iron and will recheck iron level and cbc.   For anxiety refilling your ativan to three times daily as needed. uds Advertising account planner.  Follow up 4 months or as needed.

## 2019-12-26 NOTE — Therapy (Signed)
[facility] [address] [place], [place], [address] Phone: [phone]   Fax:  [phone]  Speech Language Pathology Treatment  Patient Details  Name: Mark [name] MRN: [id number] Date of Birth: [birth year] Referring Provider (SLP): Huston Foley, MD   Encounter Date: 12/26/2019   End of Session - 12/26/19 0931    Visit Number 2    Number of Visits 17    Date for SLP Re-Evaluation 03/25/20    SLP Start Time 0805    SLP Stop Time  0846    SLP Time Calculation (min) 41 min    Activity Tolerance Patient tolerated treatment well           Past Medical History:  Diagnosis Date  . Family history of prostate cancer     DRE WNL; PSA never > 0.38  . Fasting hyperglycemia     PMH of  . GERD (gastroesophageal reflux disease)   . Tinnitus    w/o hearing loss    Past Surgical History:  Procedure Laterality Date  . COLONOSCOPY  05/2013   Neg, Dr Marina Goodell  . KNEE SURGERY Right   . TONSILLECTOMY AND ADENOIDECTOMY    . VASECTOMY      There were no vitals filed for this visit.   Subjective Assessment - 12/26/19 0816    Subjective (wife) "I notice he is drooling less" (since chewing gum)    Patient is accompained by: Family member   [name] - wife   Currently in Pain? No/denies                 ADULT SLP TREATMENT - 12/26/19 0818      General Information   Behavior/Cognition Alert;Cooperative;Pleasant mood      Treatment Provided   Treatment provided Cognitive-Linquistic      Cognitive-Linquistic Treatment   Treatment focused on Dysarthria    Skilled Treatment SLP educatd re: speech strategeis and pt practiced them at the phrase level with good to excellent success with occaisonl min cues from SLP to slow rate. SLP provided homework for slow rate.      Assessment / Recommendations / Plan   Plan Continue with current plan of care      Progression Toward Goals   Progression toward goals Progressing  toward goals            SLP Education - 12/26/19 0930    Education Details speech compensations for slow rate    Person(s) Educated Patient;Spouse    Methods Explanation;Demonstration;Verbal cues    Comprehension Verbalized understanding;Returned demonstration;Verbal cues required;Need further instruction            SLP Short Term Goals - 12/26/19 0933      SLP SHORT TERM GOAL #1   Title pt will maintain WNL volume for 10 minutes simple conversation x2 sessions    Time 4    Period Weeks    Status On-going      SLP SHORT TERM GOAL #2   Title pt will produce "ah" with at least 90dB average for 3 sessions    Time 4    Period Weeks    Status On-going      SLP SHORT TERM GOAL #3   Title pt will undergo objective swallow eval    Time 3    Period Weeks    Status On-going      SLP SHORT TERM GOAL #4   Title pt will demo articulatory HEP for speech compensations (slowed rate, overarticulation, etc)  in 3 sessions    Time 4    Period Weeks    Status On-going      SLP SHORT TERM GOAL #5   Title pt will demo abdominal breathing in 10 minutes simple conversation in 2 sessions    Time 4    Period Weeks    Status On-going            SLP Long Term Goals - 12/26/19 0933      SLP LONG TERM GOAL #1   Title pt will engage in 15 minutes mod complex conversation with average in low 70s dB in 3 sessions    Time 8    Period Weeks   or 17 sessions, for all LTGS   Status On-going      SLP LONG TERM GOAL #2   Title pt will produce loud "ah" with average low 90s dB in 6 sessions    Time 8    Period Weeks    Status On-going      SLP LONG TERM GOAL #3   Title pt will demo <3 rushes of speech which detract listener from pt's message in 15 minutes mod complex conversation in 3 sessions    Time 8    Period Weeks    Status On-going      SLP LONG TERM GOAL #4   Title pt will demo abdominal breathing 75% in 15 minutes mod complex conversation in 3 sessions    Time 8    Period  Weeks    Status On-going            Plan - 12/26/19 0931    Clinical Impression Statement "[name]" presents today with 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 Short rushes of speech occasionally noted which ceased when pt focusing on slower rate today. Objective swallow eval during this therapy course (MBSS) was attempted to be scheduled yesterday but [place] [place] states her call went directly to voicemail. Pt talked at suboptimal dB (upper 60s dB average after 3-4 minutes) until SLP told pt of his softer speech then pt incr'd loudness to WNL average more frequently. [name] tells SLP that his decr'd volume and rushes of speech have  negatively affected his communcation at work, and he has noticed his family telling him more often that they cannot understand him. SLP believes pt will benefit from skilled ST targeting incr'd volume and greater usage of abdomoinal breathing in order to improve communicative effectiveness.pt will produce loud /a/ or "hey!" with at least low 90s dB average over three sessions    Speech Therapy Frequency 2x / week    Duration 8 weeks   or 17 sessions total   Treatment/Interventions Aspiration precaution training;Pharyngeal strengthening exercises;Diet toleration management by SLP;Trials of upgraded texture/liquids;Cueing hierarchy;Internal/external aids;Patient/family education;Compensatory strategies;SLP instruction and feedback;Functional tasks;Environmental controls    Potential to Achieve Goals Good           Patient will benefit from skilled therapeutic intervention in order to improve the following deficits and impairments:   Dysphagia, oropharyngeal phase  Hypophonia  Dysarthria and anarthria    Problem List Patient Active Problem List   Diagnosis Date Noted  . Low testosterone 06/10/2019  . Neck pain 04/19/2017  . Anxiety state 04/28/2015  . Wellness examination 03/24/2015  . Acute pharyngitis 03/30/2014  . Intercostal muscle  tear 06/20/2013  . Nonspecific abnormal electrocardiogram (ECG) (EKG) 03/08/2011  . GERD 11/14/2007  . ANXIETY STATE NOS 12/11/2006  . Hyperglycemia 09/06/2006  [facility] ,MS, CCC-SLP  12/26/2019, 9:35 AM  [facility] [address] [place], [place], [address] Phone: [phone]   Fax:  [phone]   Name: Mikolaj Woolstenhulme MRN: [id number] Date of Birth: 1960/04/26

## 2019-12-27 ENCOUNTER — Telehealth: Payer: Self-pay | Admitting: Medical

## 2019-12-27 DIAGNOSIS — E611 Iron deficiency: Secondary | ICD-10-CM

## 2019-12-27 LAB — CBC WITH DIFFERENTIAL/PLATELET
Absolute Monocytes: 638 cells/uL (ref 200–950)
Basophils Absolute: 28 cells/uL (ref 0–200)
Basophils Relative: 0.5 %
Eosinophils Absolute: 90 cells/uL (ref 15–500)
Eosinophils Relative: 1.6 %
HCT: 39.7 % (ref 38.5–50.0)
Hemoglobin: 13.3 g/dL (ref 13.2–17.1)
Lymphs Abs: 1518 cells/uL (ref 850–3900)
MCH: 30.4 pg (ref 27.0–33.0)
MCHC: 33.5 g/dL (ref 32.0–36.0)
MCV: 90.6 fL (ref 80.0–100.0)
MPV: 11.3 fL (ref 7.5–12.5)
Monocytes Relative: 11.4 %
Neutro Abs: 3326 cells/uL (ref 1500–7800)
Neutrophils Relative %: 59.4 %
Platelets: 196 10*3/uL (ref 140–400)
RBC: 4.38 10*6/uL (ref 4.20–5.80)
RDW: 15.1 % — ABNORMAL HIGH (ref 11.0–15.0)
Total Lymphocyte: 27.1 %
WBC: 5.6 10*3/uL (ref 3.8–10.8)

## 2019-12-27 LAB — IRON: Iron: 41 ug/dL — ABNORMAL LOW (ref 50–180)

## 2019-12-27 MED ORDER — FERROUS SULFATE 324 (65 FE) MG PO TBEC: DELAYED_RELEASE_TABLET | ORAL | 1 refills | Status: DC

## 2019-12-27 NOTE — Telephone Encounter (Signed)
Future iron panel placed.

## 2019-12-28 LAB — DRUG MONITORING, PANEL 8 WITH CONFIRMATION, URINE
6 Acetylmorphine: NEGATIVE ng/mL (ref <10)
Alcohol Metabolites: NEGATIVE ng/mL
Alphahydroxyalprazolam: NEGATIVE ng/mL (ref <25)
Alphahydroxymidazolam: NEGATIVE ng/mL (ref <50)
Alphahydroxytriazolam: NEGATIVE ng/mL (ref <50)
Aminoclonazepam: NEGATIVE ng/mL (ref <25)
Amphetamines: NEGATIVE ng/mL (ref <500)
Benzodiazepines: POSITIVE ng/mL — AB (ref <100)
Buprenorphine, Urine: NEGATIVE ng/mL (ref <5)
Cocaine Metabolite: NEGATIVE ng/mL (ref <150)
Creatinine: 87.4 mg/dL
Hydroxyethylflurazepam: NEGATIVE ng/mL (ref <50)
Lorazepam: 253 ng/mL — ABNORMAL HIGH (ref <50)
MDMA: NEGATIVE ng/mL (ref <500)
Marijuana Metabolite: NEGATIVE ng/mL (ref <20)
Nordiazepam: NEGATIVE ng/mL (ref <50)
Opiates: NEGATIVE ng/mL (ref <100)
Oxazepam: NEGATIVE ng/mL (ref <50)
Oxidant: NEGATIVE ug/mL
Oxycodone: NEGATIVE ng/mL (ref <100)
Temazepam: NEGATIVE ng/mL (ref <50)
pH: 7.3 (ref 4.5–9.0)

## 2019-12-28 LAB — DM TEMPLATE

## 2019-12-29 ENCOUNTER — Ambulatory Visit: Payer: 59

## 2019-12-31 ENCOUNTER — Encounter: Payer: Self-pay | Admitting: Physical Therapy

## 2019-12-31 ENCOUNTER — Other Ambulatory Visit: Payer: Self-pay

## 2019-12-31 ENCOUNTER — Ambulatory Visit: Payer: 59 | Admitting: Physical Therapy

## 2019-12-31 DIAGNOSIS — R2689 Other abnormalities of gait and mobility: Secondary | ICD-10-CM

## 2019-12-31 DIAGNOSIS — R2681 Unsteadiness on feet: Secondary | ICD-10-CM

## 2019-12-31 DIAGNOSIS — R278 Other lack of coordination: Secondary | ICD-10-CM | POA: Diagnosis not present

## 2019-12-31 DIAGNOSIS — R293 Abnormal posture: Secondary | ICD-10-CM

## 2019-12-31 DIAGNOSIS — R262 Difficulty in walking, not elsewhere classified: Secondary | ICD-10-CM

## 2019-12-31 NOTE — Therapy (Signed)
[facility] [address] [place], [place], [address] Phone: [phone]   Fax:  [phone]  Physical Therapy Treatment  Patient Details  Name: Mark [name] MRN: [id number] Date of Birth: 1960-08-25 Referring Provider (PT): Huston Foley, MD   Encounter Date: 12/31/2019   PT End of Session - 12/31/19 1410    Visit Number 4    Number of Visits 17    Date for PT Re-Evaluation 03/18/20   written for 60 day POC   Authorization Type UC - 23 visit limit PT, 23 visit limit OT, 30 visit limit ST    Authorization - Visit Number 3    Authorization - Number of Visits 23    PT Start Time 1315    PT Stop Time 1356    PT Time Calculation (min) 41 min    Activity Tolerance Patient tolerated treatment well    Behavior During Therapy [facility] for tasks assessed/performed           Past Medical History:  Diagnosis Date  . Family history of prostate cancer     DRE WNL; PSA never > 0.38  . Fasting hyperglycemia     PMH of  . GERD (gastroesophageal reflux disease)   . Tinnitus    w/o hearing loss    Past Surgical History:  Procedure Laterality Date  . COLONOSCOPY  05/2013   Neg, Dr Marina Goodell  . KNEE SURGERY Right   . TONSILLECTOMY AND ADENOIDECTOMY    . VASECTOMY      There were no vitals filed for this visit.   Subjective Assessment - 12/31/19 1317    Subjective Has been working on taking the longer strides. Exercises are going well. Reports a burning pain in his right arm - esp when bringing a bottle of water up to his mouth.    Patient is accompained by: Family member   wife, [name]   Pertinent History neck pain, reflux disease, chronic tinnitus, anxiety, and allergic rhinitis, hx of COVID in July    Patient Stated Goals wants to get his L arm a little more flexible, improve his balance.    Currently in Pain? No/denies                             [facility] Adult PT Treatment/Exercise - 12/31/19 1327       Ambulation/Gait   Ambulation/Gait Yes    Ambulation/Gait Assistance 5: Supervision    Ambulation/Gait Assistance Details at end of session, initial cues for reciprocal arm swing with pt taking large strides, for 100' tactile cue from therapist for pt to swing L arm all the way back to tap therapist's arm, with cue removed pt still able to maintain incr intensity of movement with verbal cues    Ambulation Distance (Feet) 300 Feet    Assistive device None    Gait Pattern Decreased arm swing - left;Decreased arm swing - right;Step-through pattern;Decreased stride length;Decreased trunk rotation    Ambulation Surface Level;Indoor      Posture/Postural Control   Posture/Postural Control Postural limitations    Postural Limitations Forward head;Rounded Shoulders    Posture Comments supine cervical retraction into pillow 2 x 10 reps, initial tactile and verbal cues for technique for postural strengthening, pt reported this felt good after performing, added to pt's HEP      Exercises   Exercises Other Exercises    Other Exercises  SciFit level 2.3 for 5 minutes for  ROM, strengthening, activity tolerance, rpm at 80-85, pt rating level of intensity at 7-8/10              Pt performs PWR! Moves in Maxwell position   PWR! Up for improved posture x10 reps - initial cues for big hands and extending L elbow   PWR! Rock for improved weighshifting x10 reps B   PWR! Twist for improved trunk rotation -x5 reps B  PWR! Step for improved step initiation -x5 reps B, initial cues for foot clearance and looking at hand.      Pt performs PWR! Moves in SUPINE position   PWR! Up for improved posture - with arms extended into shoulder flexion and big, wide hands and bringing elbows down by chest x10 reps, pt reporting feeling good after performing   PWR! Rock for improved weighshifting -x8 reps B, cues to help push through legs and wide hands, using therapist's hand as a visual target on how far to  reach, pt improving with range each time   PWR! Twist for improved trunk rotation -x5 reps B, cues to help use legs to twist   PWR! Step for improved step initiation -x7 reps step out and out and then big scoot/bridge  Discussed with pt that potentially adding to HEP in the future to assist with stiffness in the morning         PT Education - 12/31/19 1409    Education Details supine cervical retraction addition to HEP.    Person(s) Educated Patient    Methods Explanation;Demonstration;Handout    Comprehension Verbalized understanding;Returned demonstration            PT Short Term Goals - 12/24/19 1848      PT SHORT TERM GOAL #1   Title Pt will be independent with initial HEP in order to build upon functional gains made in therapy. ALL STGS DUE 12/21/19    Time 4    Period Weeks    Status New      PT SHORT TERM GOAL #2   Title Pt will undergo with LTG to be written as appropriate.    Baseline 1345 ft 12/24/2019    Time 4    Period Weeks    Status Achieved      PT SHORT TERM GOAL #3   Title Pt will recover posterior balance in push and release test in 1 step independently, for improved balance recovery    Time 4    Period Weeks    Status New      PT SHORT TERM GOAL #4   Title Pt will decr 5x sit <> stand time to 12.5 seconds or less in order to demo improved functional BLE strength and transfer efficiency.    Baseline 15.09 seconds with no UE support.    Time 4    Period Weeks    Status New             PT Long Term Goals - 12/24/19 1848      PT LONG TERM GOAL #1   Title Pt will be independent with final HEP. ALL LTGS DUE 02/15/20    Time 8    Period Weeks    Status New      PT LONG TERM GOAL #2   Title Pt will verbalize understanding of local Parkinson's disease resources, including options for continue community fitness.    Time 8    Period Weeks    Status New      PT  LONG TERM GOAL #3   Title Pt will improve miniBEST to at least a 25/28 to  demo improved balance and decr fall risk.    Baseline 22/28    Time 8    Period Weeks    Status New      PT LONG TERM GOAL #4   Title to improve to at least 1450 ft to demo improved walking endurance and gait efficiency for pt's job.    Baseline 1345 ft 12/24/2019    Time 8    Period Weeks    Status Revised      PT LONG TERM GOAL #5   Title Pt will perform 12 steps with step through pattern and single handrail vs. no handrail with mod I for improved stair negotiation in the house.    Time 8    Period Weeks    Status New                 Plan - 12/31/19 1416    Clinical Impression Statement Added supine cervical retraction today to HEP for postural strengthening, with pt reporting additional stretch and feeling good down neck and upper back when performing. Initiated supine PWR moves in session today ,with pt responding well to visual cue for PWR rock for incr weight shift. Performed gait training at end of session with pt appearing a lot less stiff in LUE/trunk than start of session and pt able to demonstrate reciprocal arm swing after verbal/tactile cues. Pt able to maintain incr stride length throughout session. Will continue to progress towards LTGs.    Personal Factors and Comorbidities Comorbidity 3+    Comorbidities PD, hx of neck pain, chronic tinnitus, anxiety.    Examination-Activity Limitations Stand;Locomotion Level;Stairs    Examination-Participation Restrictions Community Activity;Occupation    Stability/Clinical Decision Making Stable/Uncomplicated    Rehab Potential Excellent    PT Frequency 2x / week    PT Duration 8 weeks    PT Treatment/Interventions ADLs/Self Care Home Management;Stair training;Gait training;Functional mobility training;Therapeutic activities;Balance training;Therapeutic exercise;Neuromuscular re-education;Patient/family education;Energy conservation    PT Next Visit Plan staggered stance weight shift w/ arm swing, gait training with incr  stride length/arm swing. posterior and lateral stepping strategy work. continue supine PWR moves. postural strengthening.    PT Home Exercise Plan standing PWR and [email or web address]    Consulted and Agree with Plan of Care Patient;Family member/caregiver    Family Member Consulted pt's wife [name]           Patient will benefit from skilled therapeutic intervention in order to improve the following deficits and impairments:  Abnormal gait, Decreased balance, Decreased activity tolerance, Difficulty walking, Decreased strength, Decreased coordination  Visit Diagnosis: Other abnormalities of gait and mobility  Abnormal posture  Unsteadiness on feet  Difficulty in walking, not elsewhere classified     Problem List Patient Active Problem List   Diagnosis Date Noted  . Low testosterone 06/10/2019  . Neck pain 04/19/2017  . Anxiety state 04/28/2015  . Wellness examination 03/24/2015  . Acute pharyngitis 03/30/2014  . Intercostal muscle tear 06/20/2013  . Nonspecific abnormal electrocardiogram (ECG) (EKG) 03/08/2011  . GERD 11/14/2007  . ANXIETY STATE NOS 12/11/2006  . Hyperglycemia 09/06/2006    Drake Leach, PT, DPT  12/31/2019, 2:20 PM  [facility] [address] [place], [place], [address] Phone: [phone]   Fax:  [phone]  Name: Mark [name] MRN: [id number] Date of Birth: 1960-06-05

## 2019-12-31 NOTE — Patient Instructions (Signed)
Access Code: [email or web address] URL: [email or web address] Date: 12/31/2019 Prepared by: Sherlie Ban  Exercises Supine Chin Tuck - 1-2 x daily - 5 x weekly - 2 sets - 10 reps

## 2020-01-02 ENCOUNTER — Other Ambulatory Visit: Payer: Self-pay

## 2020-01-02 ENCOUNTER — Ambulatory Visit: Payer: 59 | Admitting: Physical Therapy

## 2020-01-02 DIAGNOSIS — R278 Other lack of coordination: Secondary | ICD-10-CM | POA: Diagnosis not present

## 2020-01-02 DIAGNOSIS — R2689 Other abnormalities of gait and mobility: Secondary | ICD-10-CM

## 2020-01-02 NOTE — Therapy (Signed)
[facility] [address] [place], [place], [address] Phone: [phone]   Fax:  [phone]  Physical Therapy Treatment  Patient Details  Name: Mark [name] MRN: [id number] Date of Birth: 1961/02/03 Referring Provider (PT): Huston Foley, MD   Encounter Date: 01/02/2020   PT End of Session - 01/02/20 0810    Visit Number 5    Number of Visits 17    Date for PT Re-Evaluation 03/18/20   written for 60 day POC   Authorization Type UC - 23 visit limit PT, 23 visit limit OT, 30 visit limit ST    Authorization - Visit Number 4    Authorization - Number of Visits 23    PT Start Time 0806    PT Stop Time 0845    PT Time Calculation (min) 39 min    Activity Tolerance Patient tolerated treatment well    Behavior During Therapy [facility] for tasks assessed/performed           Past Medical History:  Diagnosis Date  . Family history of prostate cancer     DRE WNL; PSA never > 0.38  . Fasting hyperglycemia     PMH of  . GERD (gastroesophageal reflux disease)   . Tinnitus    w/o hearing loss    Past Surgical History:  Procedure Laterality Date  . COLONOSCOPY  05/2013   Neg, Dr Marina Goodell  . KNEE SURGERY Right   . TONSILLECTOMY AND ADENOIDECTOMY    . VASECTOMY      There were no vitals filed for this visit.   Subjective Assessment - 01/02/20 1216    Subjective No changes, like the neck exercises.    Patient is accompained by: Family member   wife, Mark [name]   Pertinent History neck pain, reflux disease, chronic tinnitus, anxiety, and allergic rhinitis, hx of COVID in July    Patient Stated Goals wants to get his L arm a little more flexible, improve his balance.    Currently in Pain? No/denies                Pt performs PWR! Moves in SUPINE position   PWR! Up for improved posture - with arms extended into shoulder flexion and big, wide hands and bringing elbows down by chest x 2 sets x 10 reps, pt reporting feeling  good after performing.  Then PWR! Up through hips x 10 reps with legs straight   PWR! Rock for improved weighshifting -x10 reps B, cues to help push through legs and wide hands, using therapist's hand as a visual target on how far to reach, pt improving with range each time    PWR! Twist for improved trunk rotation -x5 reps B, cues to help use legs to twist    PWR! Step for improved step initiation -x5 reps step out and out and then big scoot/bridge              [facility] Adult PT Treatment/Exercise - 01/02/20 0001      Ambulation/Gait   Ambulation/Gait Yes    Ambulation/Gait Assistance 5: Supervision    Ambulation/Gait Assistance Details tactile cues initially for L arm swing    Ambulation Distance (Feet) 345 Feet   then 230   Assistive device None    Gait Pattern Decreased arm swing - left;Decreased arm swing - right;Step-through pattern;Decreased stride length;Decreased trunk rotation    Ambulation Surface Level;Indoor    Gait Comments Dynamic gait activities to encourage arm swing:  forward/back walking x  3 reps 20 ft; forward marching with reciprocal arm lifts x 4 reps 20 ft; long stride/pause with reciprocal arm swing x 25 ft.  Then additional 100 ft with deliberate increased step length and arm swing.      Posture/Postural Control   Posture/Postural Control Postural limitations    Postural Limitations Forward head;Rounded Shoulders    Posture Comments Reviewed addition to HEP, with pt performing supine cervical retraction into 2 pillows, x 10 then one pillow x 10.  Seated at wall, with pillow behind head x 10 reps chin tuck/neck retraction.  Cues for pt to be "tall as the wall", as a reference for postural awareness at other times throughout his day.      Neuro Re-ed    Neuro Re-ed Details  Stagger stance anterior/posterior weightshifting x 10 reps through hips, then 10 reps with L arm swing (cues for increased swing in posterior direction), with one hand at counter; then 10  reps with reciprocal arm swing, tactile cues for LUE; performed in both foot positions.  Corner trunk rotation, x 5 reps each side reaching across body to wall, then additional 10 reps, further away from wall, with trunk rotation, activating reach with both arms (one arm reaching across front of body, one arm in back).                    PT Short Term Goals - 01/02/20 1228      PT SHORT TERM GOAL #1   Title Pt will be independent with initial HEP in order to build upon functional gains made in therapy. ALL STGS DUE 01/16/2020 (corrected date from eval)    Time 4    Period Weeks    Status New      PT SHORT TERM GOAL #2   Title Pt will undergo with LTG to be written as appropriate.    Baseline 1345 ft 12/24/2019    Time 4    Period Weeks    Status Achieved      PT SHORT TERM GOAL #3   Title Pt will recover posterior balance in push and release test in 1 step independently, for improved balance recovery    Time 4    Period Weeks    Status New      PT SHORT TERM GOAL #4   Title Pt will decr 5x sit <> stand time to 12.5 seconds or less in order to demo improved functional BLE strength and transfer efficiency.    Baseline 15.09 seconds with no UE support.    Time 4    Period Weeks    Status New             PT Long Term Goals - 12/24/19 1848      PT LONG TERM GOAL #1   Title Pt will be independent with final HEP. ALL LTGS DUE 02/15/20    Time 8    Period Weeks    Status New      PT LONG TERM GOAL #2   Title Pt will verbalize understanding of local Parkinson's disease resources, including options for continue community fitness.    Time 8    Period Weeks    Status New      PT LONG TERM GOAL #3   Title Pt will improve miniBEST to at least a 25/28 to demo improved balance and decr fall risk.    Baseline 22/28    Time 8    Period Weeks  Status New      PT LONG TERM GOAL #4   Title to improve to at least 1450 ft to demo improved walking endurance and  gait efficiency for pt's job.    Baseline 1345 ft 12/24/2019    Time 8    Period Weeks    Status Revised      PT LONG TERM GOAL #5   Title Pt will perform 12 steps with step through pattern and single handrail vs. no handrail with mod I for improved stair negotiation in the house.    Time 8    Period Weeks    Status New                 Plan - 01/02/20 1224    Clinical Impression Statement Worked again in [place]! Moves supine, but did not yet give as HEP.  Reviewed cervical retraction and pt return demo understanding, though he tends to pull into neck flexion position with other activities in session, needing cues to relax.  He appears to have slightly better coordination with increased step length and reciprocal arm swing.  He will continue to benefit from skilled physical therapy towards goals for improved overall functional mobility.    Personal Factors and Comorbidities Comorbidity 3+    Comorbidities PD, hx of neck pain, chronic tinnitus, anxiety.    Examination-Activity Limitations Stand;Locomotion Level;Stairs    Examination-Participation Restrictions Community Activity;Occupation    Stability/Clinical Decision Making Stable/Uncomplicated    Rehab Potential Excellent    PT Frequency 2x / week    PT Duration 8 weeks    PT Treatment/Interventions ADLs/Self Care Home Management;Stair training;Gait training;Functional mobility training;Therapeutic activities;Balance training;Therapeutic exercise;Neuromuscular re-education;Patient/family education;Energy conservation    PT Next Visit Plan staggered stance weight shift w/ arm swing, gait training with incr stride length/arm swing. posterior and lateral stepping strategy work. continue supine PWR moves. postural strengthening.  Standing PWR! Moves (varied surfaces, varied direction stepping); step ups with BUE motions    PT Home Exercise Plan standing PWR and [id number]    Consulted and Agree with Plan of Care Patient            Patient will benefit from skilled therapeutic intervention in order to improve the following deficits and impairments:  Abnormal gait, Decreased balance, Decreased activity tolerance, Difficulty walking, Decreased strength, Decreased coordination  Visit Diagnosis: Other abnormalities of gait and mobility     Problem List Patient Active Problem List   Diagnosis Date Noted  . Low testosterone 06/10/2019  . Neck pain 04/19/2017  . Anxiety state 04/28/2015  . Wellness examination 03/24/2015  . Acute pharyngitis 03/30/2014  . Intercostal muscle tear 06/20/2013  . Nonspecific abnormal electrocardiogram (ECG) (EKG) 03/08/2011  . GERD 11/14/2007  . ANXIETY STATE NOS 12/11/2006  . Hyperglycemia 09/06/2006    Mark [name] W. 01/02/2020, 12:31 PM  Gean Maidens., PT    [facility] [address] [place], [place], [address] Phone: [phone]   Fax:  [phone]  Name: Mark [name] MRN: [id number] Date of Birth: 28-Dec-1960

## 2020-01-05 ENCOUNTER — Ambulatory Visit: Payer: 59 | Admitting: Occupational Therapy

## 2020-01-05 ENCOUNTER — Ambulatory Visit: Payer: 59 | Admitting: Physical Therapy

## 2020-01-05 ENCOUNTER — Encounter: Payer: Self-pay | Admitting: Physical Therapy

## 2020-01-05 ENCOUNTER — Other Ambulatory Visit: Payer: Self-pay

## 2020-01-05 DIAGNOSIS — R2689 Other abnormalities of gait and mobility: Secondary | ICD-10-CM

## 2020-01-05 DIAGNOSIS — R293 Abnormal posture: Secondary | ICD-10-CM

## 2020-01-05 DIAGNOSIS — R2681 Unsteadiness on feet: Secondary | ICD-10-CM

## 2020-01-05 DIAGNOSIS — R278 Other lack of coordination: Secondary | ICD-10-CM | POA: Diagnosis not present

## 2020-01-05 DIAGNOSIS — R262 Difficulty in walking, not elsewhere classified: Secondary | ICD-10-CM

## 2020-01-05 NOTE — Therapy (Signed)
[facility] [address] [place], [place], [address] Phone: [phone]   Fax:  [phone]  Physical Therapy Treatment  Patient Details  Name: Mark [name] MRN: [id number] Date of Birth: 09-Jul-1960 Referring Provider (PT): Huston Foley, MD   Encounter Date: 01/05/2020   PT End of Session - 01/05/20 1519    Visit Number 6    Number of Visits 17    Date for PT Re-Evaluation 03/18/20   written for 60 day POC   Authorization Type UC - 23 visit limit PT, 23 visit limit OT, 30 visit limit ST    Authorization - Visit Number 5    Authorization - Number of Visits 23    PT Start Time 0801    PT Stop Time 0843    PT Time Calculation (min) 42 min    Activity Tolerance Patient tolerated treatment well    Behavior During Therapy [facility] for tasks assessed/performed           Past Medical History:  Diagnosis Date  . Family history of prostate cancer     DRE WNL; PSA never > 0.38  . Fasting hyperglycemia     PMH of  . GERD (gastroesophageal reflux disease)   . Tinnitus    w/o hearing loss    Past Surgical History:  Procedure Laterality Date  . COLONOSCOPY  05/2013   Neg, Dr Marina Goodell  . KNEE SURGERY Right   . TONSILLECTOMY AND ADENOIDECTOMY    . VASECTOMY      There were no vitals filed for this visit.   Subjective Assessment - 01/05/20 0803    Subjective No changes since he was last here - doing the exercises. Had some neck pain over the weekend - felt better after doing the exercises.    Patient is accompained by: Family member   wife, [name]   Pertinent History neck pain, reflux disease, chronic tinnitus, anxiety, and allergic rhinitis, hx of COVID in July    Patient Stated Goals wants to get his L arm a little more flexible, improve his balance.    Currently in Pain? Yes    Pain Score 3     Pain Location Arm   forearm   Pain Orientation Left    Pain Descriptors / Indicators Sore   "feels stiff"   Aggravating  Factors  lifting things with L arm    Pain Relieving Factors not working                          Pt performs PWR! Moves in SUPINE position   PWR! Up for improved posture - with arms extended into shoulder flexion and big, wide hands and bringing elbows down by chest x10 reps  PWR! Rock for improved weighshifting -x10 reps B, cues to help push through legs and wide hands, using therapist's hand as a visual target on how far to reach, pt improving with range each time   PWR! Twist for improved trunk rotation -x8 reps B, cues to help use legs to twist and to reset in middle with tall posture before twisting to opposite side   PWR! Step for improved step initiation -x10 reps B step out and out and then big scoot/bridge  Added to pt's HEP and provided handout.     [facility] Adult PT Treatment/Exercise - 01/05/20 0001      Ambulation/Gait   Ambulation/Gait Yes    Ambulation/Gait Assistance 5: Supervision  Ambulation/Gait Assistance Details Performing gait with focus on reciprocal arm swing and then adding in quick start/stops with pt having no LOB, then performing cognitive challenge asking pt to name foods for thanksgiving/naming foods in alphabetical order A-Z, pt slowly gait speed at times and needing intermittent verbal and tactile cues for L arm swing.    Ambulation Distance (Feet) 500 Feet    Assistive device None    Gait Pattern Decreased arm swing - left;Decreased arm swing - right;Step-through pattern;Decreased stride length;Decreased trunk rotation    Ambulation Surface Indoor;Level               Balance Exercises - 01/05/20 0001      Balance Exercises: Standing   Stepping Strategy Lateral;Posterior;Foam/compliant surface;Limitations    Stepping Strategy Limitations on blue air ex; alternating posterior x10 reps B, then performing an additional x10 reps with pt having arm extended when stepping backwards (cues for big hands and arms extended), then  performed lateral stepping on air ex PWR step x10 reps B- with cues to look at hands   Rockerboard Anterior/posterior;EO;Limitations    Rockerboard Limitations weight shifting with ankle/hip strategy work [employer], then adding in reciprocal arm swing x15 reps with verbal and tactile cues for incr arm swing, pt intermittently needing to touch bars for balance    Marching Solid surface;Forwards;Limitations;Cognitive challenge    Marching Limitations 4 x 50' with reciprocal arm lift and then adding in cognitive challenge with pt counting backwards by 3            PT Education - 01/05/20 1519    Education Details supine PWR moves addition to HEP    Person(s) Educated Patient    Methods Explanation;Handout;Demonstration    Comprehension Verbalized understanding;Returned demonstration            PT Short Term Goals - 01/02/20 1228      PT SHORT TERM GOAL #1   Title Pt will be independent with initial HEP in order to build upon functional gains made in therapy. ALL STGS DUE 01/16/2020 (corrected date from eval)    Time 4    Period Weeks    Status New      PT SHORT TERM GOAL #2   Title Pt will undergo with LTG to be written as appropriate.    Baseline 1345 ft 12/24/2019    Time 4    Period Weeks    Status Achieved      PT SHORT TERM GOAL #3   Title Pt will recover posterior balance in push and release test in 1 step independently, for improved balance recovery    Time 4    Period Weeks    Status New      PT SHORT TERM GOAL #4   Title Pt will decr 5x sit <> stand time to 12.5 seconds or less in order to demo improved functional BLE strength and transfer efficiency.    Baseline 15.09 seconds with no UE support.    Time 4    Period Weeks    Status New             PT Long Term Goals - 12/24/19 1848      PT LONG TERM GOAL #1   Title Pt will be independent with final HEP. ALL LTGS DUE 02/15/20    Time 8    Period Weeks    Status New      PT LONG TERM GOAL #2   Title  Pt will verbalize  understanding of local Parkinson's disease resources, including options for continue community fitness.    Time 8    Period Weeks    Status New      PT LONG TERM GOAL #3   Title Pt will improve miniBEST to at least a 25/28 to demo improved balance and decr fall risk.    Baseline 22/28    Time 8    Period Weeks    Status New      PT LONG TERM GOAL #4   Title to improve to at least 1450 ft to demo improved walking endurance and gait efficiency for pt's job.    Baseline 1345 ft 12/24/2019    Time 8    Period Weeks    Status Revised      PT LONG TERM GOAL #5   Title Pt will perform 12 steps with step through pattern and single handrail vs. no handrail with mod I for improved stair negotiation in the house.    Time 8    Period Weeks    Status New                 Plan - 01/05/20 1522    Clinical Impression Statement Gave pt supine PWR moves for HEP. Pt with improved coordination with incr stride length and reciprocal arm swing when ambulating into session, however when paired with cog dual tasking during gait, pt with tendency to lose L arm swing - needing verbal and tactile cues. Will continue to progress towards LTGs.    Personal Factors and Comorbidities Comorbidity 3+    Comorbidities PD, hx of neck pain, chronic tinnitus, anxiety.    Examination-Activity Limitations Stand;Locomotion Level;Stairs    Examination-Participation Restrictions Community Activity;Occupation    Stability/Clinical Decision Making Stable/Uncomplicated    Rehab Potential Excellent    PT Frequency 2x / week    PT Duration 8 weeks    PT Treatment/Interventions ADLs/Self Care Home Management;Stair training;Gait training;Functional mobility training;Therapeutic activities;Balance training;Therapeutic exercise;Neuromuscular re-education;Patient/family education;Energy conservation    PT Next Visit Plan staggered stance weight shift w/ arm swing, gait training with incr stride  length/arm swing with dual tasking.  postural strengthening.  Standing PWR! Moves (varied surfaces, varied direction stepping); step ups with BUE motions    PT Home Exercise Plan standing PWR, supine PWR, and LXBLJJGT    Consulted and Agree with Plan of Care Patient           Patient will benefit from skilled therapeutic intervention in order to improve the following deficits and impairments:  Abnormal gait, Decreased balance, Decreased activity tolerance, Difficulty walking, Decreased strength, Decreased coordination  Visit Diagnosis: Other abnormalities of gait and mobility  Abnormal posture  Unsteadiness on feet  Difficulty in walking, not elsewhere classified     Problem List Patient Active Problem List   Diagnosis Date Noted  . Low testosterone 06/10/2019  . Neck pain 04/19/2017  . Anxiety state 04/28/2015  . Wellness examination 03/24/2015  . Acute pharyngitis 03/30/2014  . Intercostal muscle tear 06/20/2013  . Nonspecific abnormal electrocardiogram (ECG) (EKG) 03/08/2011  . GERD 11/14/2007  . ANXIETY STATE NOS 12/11/2006  . Hyperglycemia 09/06/2006    Drake Leach, PT, DPT  01/05/2020, 3:24 PM  [facility] [address] [place], [place], [address] Phone: [phone]   Fax:  [phone]  Name: Garik Diamant MRN: [id number] Date of Birth: 1960-12-18

## 2020-01-07 ENCOUNTER — Other Ambulatory Visit: Payer: Self-pay

## 2020-01-07 ENCOUNTER — Encounter: Payer: Self-pay | Admitting: Physical Therapy

## 2020-01-07 ENCOUNTER — Ambulatory Visit: Payer: 59 | Admitting: Physical Therapy

## 2020-01-07 DIAGNOSIS — R2681 Unsteadiness on feet: Secondary | ICD-10-CM

## 2020-01-07 DIAGNOSIS — R293 Abnormal posture: Secondary | ICD-10-CM

## 2020-01-07 DIAGNOSIS — R278 Other lack of coordination: Secondary | ICD-10-CM | POA: Diagnosis not present

## 2020-01-07 DIAGNOSIS — R2689 Other abnormalities of gait and mobility: Secondary | ICD-10-CM

## 2020-01-07 DIAGNOSIS — R262 Difficulty in walking, not elsewhere classified: Secondary | ICD-10-CM

## 2020-01-07 NOTE — Therapy (Signed)
[facility] [address] [place], [place], [address] Phone: [phone]   Fax:  [phone]  Physical Therapy Treatment  Patient Details  Name: Mark [name] MRN: [id number] Date of Birth: 09-Jan-1961 Referring Provider (PT): Huston Foley, MD   Encounter Date: 01/07/2020   PT End of Session - 01/07/20 1222    Visit Number 7    Number of Visits 17    Date for PT Re-Evaluation 03/18/20   written for 60 day POC   Authorization Type UC - 23 visit limit PT, 23 visit limit OT, 30 visit limit ST    Authorization - Visit Number 6    Authorization - Number of Visits 23    PT Start Time 1016    PT Stop Time 1058    PT Time Calculation (min) 42 min    Activity Tolerance Patient tolerated treatment well    Behavior During Therapy WFL for tasks assessed/performed           Past Medical History:  Diagnosis Date  . Family history of prostate cancer     DRE WNL; PSA never > 0.38  . Fasting hyperglycemia     PMH of  . GERD (gastroesophageal reflux disease)   . Tinnitus    w/o hearing loss    Past Surgical History:  Procedure Laterality Date  . COLONOSCOPY  05/2013   Neg, Dr Marina Goodell  . KNEE SURGERY Right   . TONSILLECTOMY AND ADENOIDECTOMY    . VASECTOMY      There were no vitals filed for this visit.   Subjective Assessment - 01/07/20 1017    Subjective No changes since he was last here. Feels like neck tenses up during walking or jogging, but doing the exercises seem to help. Other than that, nothing has changed.    Patient is accompained by: Family member   wife, [name]   Pertinent History neck pain, reflux disease, chronic tinnitus, anxiety, and allergic rhinitis, hx of COVID in July    Patient Stated Goals wants to get his L arm a little more flexible, improve his balance.    Currently in Pain? No/denies                          Pt performs PWR! Moves in standing position on blue foam beam with  chair anteriorly for balance as needed:     PWR! Up for improved posture 2 x 10 reps   PWR! Rock for improved weighshifting 2 x 10 reps - cues to look up at hands, pt with one episode of losing balance posteriorly and sitting back to mat   Cues provided for big hands and L elbow extension.       [facility] Adult PT Treatment/Exercise - 01/07/20 0001      High Level Balance   High Level Balance Comments multi directional stepping following sheet:  Initially following directions and then progressing from doing the opposite of what is written and saying the opposite direction, also progressed to putting 4" obstacles laterally and anteriorly for pt to step over each time for incr foot clearance/step height.      Exercises   Exercises Other Exercises    Other Exercises  posture exercises: x10 reps scapular retraction in seated with verbal and tactile cues for technique - discussed with pt to perform whenever he feels like he has incr neck pain, performed seated cervical retraction against wall into pillow x10 reps with verbal  and demo cues to perform chin tuck initially.               Balance Exercises - 01/07/20 0001      Balance Exercises: Standing   Marching Foam/compliant surface    Marching Limitations on blue mat: stepping over 3 larger orange obstacles with reciprocal arm lift  Cues for holding SLS down and back x2 reps, performing an additional down and back 3 reps with cognitive challenge: pt naming shoe brands at work   Other Standing Exercises Comments on blue mat: staggered stance weight shifting  Beginning with single UE arm swing and then progressing to B arm swing while performing - pt needing verbal and tactile cues for incr LUE arm swing in the posterior direction               PT Short Term Goals - 01/02/20 1228      PT SHORT TERM GOAL #1   Title Pt will be independent with initial HEP in order to build upon functional gains made in therapy. ALL STGS DUE 01/16/2020  (corrected date from eval)    Time 4    Period Weeks    Status New      PT SHORT TERM GOAL #2   Title Pt will undergo with LTG to be written as appropriate.    Baseline 1345 ft 12/24/2019    Time 4    Period Weeks    Status Achieved      PT SHORT TERM GOAL #3   Title Pt will recover posterior balance in push and release test in 1 step independently, for improved balance recovery    Time 4    Period Weeks    Status New      PT SHORT TERM GOAL #4   Title Pt will decr 5x sit <> stand time to 12.5 seconds or less in order to demo improved functional BLE strength and transfer efficiency.    Baseline 15.09 seconds with no UE support.    Time 4    Period Weeks    Status New             PT Long Term Goals - 12/24/19 1848      PT LONG TERM GOAL #1   Title Pt will be independent with final HEP. ALL LTGS DUE 02/15/20    Time 8    Period Weeks    Status New      PT LONG TERM GOAL #2   Title Pt will verbalize understanding of local Parkinson's disease resources, including options for continue community fitness.    Time 8    Period Weeks    Status New      PT LONG TERM GOAL #3   Title Pt will improve miniBEST to at least a 25/28 to demo improved balance and decr fall risk.    Baseline 22/28    Time 8    Period Weeks    Status New      PT LONG TERM GOAL #4   Title to improve to at least 1450 ft to demo improved walking endurance and gait efficiency for pt's job.    Baseline 1345 ft 12/24/2019    Time 8    Period Weeks    Status Revised      PT LONG TERM GOAL #5   Title Pt will perform 12 steps with step through pattern and single handrail vs. no handrail with mod I for improved stair negotiation in  the house.    Time 8    Period Weeks    Status New                 Plan - 01/07/20 1223    Clinical Impression Statement Focus of today's skilled session was standing PWR moves on compliant surfaces, cognitive dual tasking with multi-directional stepping  and high level balance, and coordination activities with BUE/BLE. pt challenged initially with changing around the directions of following instructions with multi-directional stepping, but pt improved with incr reps. Will continue to progress towards LTGs.    Personal Factors and Comorbidities Comorbidity 3+    Comorbidities PD, hx of neck pain, chronic tinnitus, anxiety.    Examination-Activity Limitations Stand;Locomotion Level;Stairs    Examination-Participation Restrictions Community Activity;Occupation    Stability/Clinical Decision Making Stable/Uncomplicated    Rehab Potential Excellent    PT Frequency 2x / week    PT Duration 8 weeks    PT Treatment/Interventions ADLs/Self Care Home Management;Stair training;Gait training;Functional mobility training;Therapeutic activities;Balance training;Therapeutic exercise;Neuromuscular re-education;Patient/family education;Energy conservation    PT Next Visit Plan high level gait training with incr stride length/arm swing with dual tasking.  postural strengthening.  Standing PWR! Moves Flow, continue ried direction stepping; step ups with BUE motions, high level balance on compliant surfaces    PT Home Exercise Plan standing PWR, supine PWR, and LXBLJJGT    Consulted and Agree with Plan of Care Patient           Patient will benefit from skilled therapeutic intervention in order to improve the following deficits and impairments:  Abnormal gait, Decreased balance, Decreased activity tolerance, Difficulty walking, Decreased strength, Decreased coordination  Visit Diagnosis: Other abnormalities of gait and mobility  Abnormal posture  Unsteadiness on feet  Difficulty in walking, not elsewhere classified     Problem List Patient Active Problem List   Diagnosis Date Noted  . Low testosterone 06/10/2019  . Neck pain 04/19/2017  . Anxiety state 04/28/2015  . Wellness examination 03/24/2015  . Acute pharyngitis 03/30/2014  . Intercostal  muscle tear 06/20/2013  . Nonspecific abnormal electrocardiogram (ECG) (EKG) 03/08/2011  . GERD 11/14/2007  . ANXIETY STATE NOS 12/11/2006  . Hyperglycemia 09/06/2006    Drake Leach, PT, DPT  01/07/2020, 12:25 PM  [facility] [address] [place], [place], [address] Phone: [phone]   Fax:  [phone]  Name: Matin Mattioli MRN: [id number] Date of Birth: April 08, 1960

## 2020-01-12 ENCOUNTER — Ambulatory Visit: Payer: 59 | Admitting: Physical Therapy

## 2020-01-12 ENCOUNTER — Ambulatory Visit: Payer: 59 | Admitting: Occupational Therapy

## 2020-01-13 ENCOUNTER — Encounter (HOSPITAL_COMMUNITY): Payer: 59

## 2020-01-13 ENCOUNTER — Ambulatory Visit (HOSPITAL_COMMUNITY): Payer: 59

## 2020-01-14 ENCOUNTER — Encounter: Payer: Self-pay | Admitting: Physical Therapy

## 2020-01-14 ENCOUNTER — Ambulatory Visit: Payer: 59 | Attending: Neurology | Admitting: Physical Therapy

## 2020-01-14 ENCOUNTER — Telehealth: Payer: Self-pay | Admitting: Neurology

## 2020-01-14 ENCOUNTER — Other Ambulatory Visit: Payer: Self-pay

## 2020-01-14 ENCOUNTER — Encounter: Payer: Self-pay | Admitting: Neurology

## 2020-01-14 DIAGNOSIS — M25622 Stiffness of left elbow, not elsewhere classified: Secondary | ICD-10-CM | POA: Insufficient documentation

## 2020-01-14 DIAGNOSIS — R29818 Other symptoms and signs involving the nervous system: Secondary | ICD-10-CM | POA: Diagnosis present

## 2020-01-14 DIAGNOSIS — R29898 Other symptoms and signs involving the musculoskeletal system: Secondary | ICD-10-CM | POA: Diagnosis present

## 2020-01-14 DIAGNOSIS — R2689 Other abnormalities of gait and mobility: Secondary | ICD-10-CM | POA: Insufficient documentation

## 2020-01-14 DIAGNOSIS — R2681 Unsteadiness on feet: Secondary | ICD-10-CM | POA: Diagnosis present

## 2020-01-14 DIAGNOSIS — R471 Dysarthria and anarthria: Secondary | ICD-10-CM | POA: Insufficient documentation

## 2020-01-14 DIAGNOSIS — R293 Abnormal posture: Secondary | ICD-10-CM

## 2020-01-14 DIAGNOSIS — R1312 Dysphagia, oropharyngeal phase: Secondary | ICD-10-CM | POA: Insufficient documentation

## 2020-01-14 DIAGNOSIS — R262 Difficulty in walking, not elsewhere classified: Secondary | ICD-10-CM | POA: Diagnosis present

## 2020-01-14 DIAGNOSIS — M25612 Stiffness of left shoulder, not elsewhere classified: Secondary | ICD-10-CM | POA: Insufficient documentation

## 2020-01-14 DIAGNOSIS — R278 Other lack of coordination: Secondary | ICD-10-CM | POA: Insufficient documentation

## 2020-01-14 MED ORDER — ROPINIROLE HCL ER 6 MG PO TB24: 6.0000 mg | ORAL_TABLET | Freq: Every day | ORAL | 3 refills | Status: DC

## 2020-01-14 NOTE — Telephone Encounter (Signed)
Please see my chart message for reference.  Please advise patient that we can change his immediate release ropinirole to once daily long-acting which may have a more favorable side effect profile.  I recommend ropinirole 6 mg strength 1 pill each evening.  Once he is able to fill the prescription for the 6 mg strength long-acting, he can stop taking the immediate release and switch over to the once daily long-acting.  Rx was sent to [facility].

## 2020-01-14 NOTE — Telephone Encounter (Signed)
PA for ropinirole ER completed via CMM. Sent to [facility]. KeyCelene Kras - PA Case ID: [id number] - Rx #: [id number]

## 2020-01-14 NOTE — Telephone Encounter (Signed)
Responded via mychart

## 2020-01-14 NOTE — Therapy (Signed)
[facility] [address] [place], [place], [address] Phone: [phone]   Fax:  [phone]  Physical Therapy Treatment  Patient Details  Name: Mark [name] MRN: [id number] Date of Birth: 1960/08/08 Referring Provider (PT): Star Age, MD   Encounter Date: 01/14/2020   PT End of Session - 01/14/20 1606    Visit Number 8    Number of Visits 17    Date for PT Re-Evaluation 03/18/20   written for 60 day POC   Authorization Type UC - 23 visit limit PT, 23 visit limit OT, 30 visit limit ST    Authorization - Visit Number 7    Authorization - Number of Visits 23    PT Start Time 1316    PT Stop Time 1359    PT Time Calculation (min) 43 min    Activity Tolerance Patient tolerated treatment well    Behavior During Therapy WFL for tasks assessed/performed           Past Medical History:  Diagnosis Date   Family history of prostate cancer     DRE WNL; PSA never > 0.38   Fasting hyperglycemia     PMH of   GERD (gastroesophageal reflux disease)    Tinnitus    w/o hearing loss    Past Surgical History:  Procedure Laterality Date   COLONOSCOPY  05/2013   Neg, Dr Henrene Pastor   KNEE SURGERY Right    TONSILLECTOMY AND ADENOIDECTOMY     VASECTOMY      There were no vitals filed for this visit.   Subjective Assessment - 01/14/20 1318    Subjective No changes since being here. Wasn't feel too good on monday from his medication.    Patient is accompained by: Family member   wife, Mark [name]   Pertinent History neck pain, reflux disease, chronic tinnitus, anxiety, and allergic rhinitis, hx of COVID in July    Patient Stated Goals wants to get his L arm a little more flexible, improve his balance.    Currently in Pain? No/denies                             [facility] Adult PT Treatment/Exercise - 01/14/20 0001      Transfers   Transfers Sit to Stand;Stand to Sit    Sit to Stand 6: Modified independent  (Device/Increase time);Without upper extremity assist;From bed    Five time sit to stand comments  10.88 seconds without BUE support    Stand to Sit 6: Modified independent (Device/Increase time);Without upper extremity assist      Ambulation/Gait   Ambulation/Gait Yes    Ambulation/Gait Assistance 5: Supervision    Ambulation/Gait Assistance Details between activities with pt demonstrating improved L arm swing     Assistive device None    Gait Pattern Decreased arm swing - left;Decreased arm swing - right;Step-through pattern;Decreased stride length;Decreased trunk rotation    Ambulation Surface Indoor;Level      High Level Balance   High Level Balance Comments push and release test: 3 reps - 2 steps in posterior direction               Pt performs PWR! Moves in standing position:    PWR! Up for improved posture x5 reps   PWR! Rock for improved weighshifting x5 reps B  PWR! Twist for improved trunk rotation  x5 reps B - cues for pivoting on feet for incr trunk rotation  PWR! Step for improved step initiation x5 reps B   PWR Moves Flow:  Up > Rock > Twist > Step  For 7 reps, initial verbal and demo cues for technique, discussed with pt adding to HEP (with sheet in front) every other day and alternating with regular PWR moves in standing.     01/14/20 0001  Balance Exercises: Standing  Standing Eyes Closed Narrow base of support (BOS);Foam/compliant surface;Limitations  Standing Eyes Closed Limitations 3 x 30 seconds on air ex, 2 x 10 reps head turns (needing wall/chair for balance intermittently)  SLS with Vectors Foam/compliant surface;Limitations  SLS with Vectors Limitations Alternating stepping onto rockerboard and then performing forward cone tap x6 reps B and then forward and cross body cone tap x10 reps B, intermittent touch to bars, incr difficulty with LLE   Stepping Strategy Posterior;Limitations  Stepping Strategy Limitations on rockerboard alternating posterior  stepping x10 reps B, then adding whenever stepping backwards bringing arms forward x10 reps   Rockerboard Anterior/posterior;Limitations  Rockerboard Limitations weight shifting in posterior direction for hip strategy x10 reps with intermittent touch to bars for balance   Other Standing Exercises Comments on blue air ex: heel toe raises x15 reps     PT Education - 01/14/20 1606    Education Details provided pt with handout for local PD resources, adding in standing PWR Flow to HEP every other day    Person(s) Educated Patient    Methods Explanation;Demonstration;Handout    Comprehension Verbalized understanding;Returned demonstration            PT Short Term Goals - 01/14/20 1324      PT SHORT TERM GOAL #1   Title Pt will be independent with initial HEP in order to build upon functional gains made in therapy. ALL STGS DUE 01/16/2020 (corrected date from eval)    Time 4    Period Weeks    Status Achieved      PT SHORT TERM GOAL #2   Title Pt will undergo 6MWT with LTG to be written as appropriate.    Baseline 1345 ft 12/24/2019    Time 4    Period Weeks    Status Achieved      PT SHORT TERM GOAL #3   Title Pt will recover posterior balance in push and release test in 1 step independently, for improved balance recovery    Baseline 2 steps independently for balance recoverry, 1 step in forward direction.    Time 4    Period Weeks    Status Not Met      PT SHORT TERM GOAL #4   Title Pt will decr 5x sit <> stand time to 12.5 seconds or less in order to demo improved functional BLE strength and transfer efficiency.    Baseline 15.09 seconds with no UE support, 10.88 seconds without BUE support    Time 4    Period Weeks    Status Achieved             PT Long Term Goals - 12/24/19 1848      PT LONG TERM GOAL #1   Title Pt will be independent with final HEP. ALL LTGS DUE 02/15/20    Time 8    Period Weeks    Status New      PT LONG TERM GOAL #2   Title Pt will verbalize  understanding of local Parkinson's disease resources, including options for continue community fitness.    Time 8    Period Weeks  Status New      PT LONG TERM GOAL #3   Title Pt will improve miniBEST to at least a 25/28 to demo improved balance and decr fall risk.    Baseline 22/28    Time 8    Period Weeks    Status New      PT LONG TERM GOAL #4   Title 6MWT to improve to at least 1450 ft to demo improved walking endurance and gait efficiency for pt's job.    Baseline 1345 ft 12/24/2019    Time 8    Period Weeks    Status Revised      PT LONG TERM GOAL #5   Title Pt will perform 12 steps with step through pattern and single handrail vs. no handrail with mod I for improved stair negotiation in the house.    Time 8    Period Weeks    Status New                 Plan - 01/14/20 1615    Clinical Impression Statement Assessed pt's STGs today with pt meeting 3 out of 4 STGs. Pt with improvements of 5x sit <> stand (10.88 seconds today and previously was 15.09 seconds). Pt did not meet LTG #3 - needed to take 2 steps for balance recovery in the posterior direction. Added standing PWR moves flow (alternating with regular PWR moves) to pt's HEP. Pt with incr difficulty with balance with SLS on LLE, but did improve with incr reps. Will continue to progress towards LTGs.    Personal Factors and Comorbidities Comorbidity 3+    Comorbidities PD, hx of neck pain, chronic tinnitus, anxiety.    Examination-Activity Limitations Stand;Locomotion Level;Stairs    Examination-Participation Restrictions Community Activity;Occupation    Stability/Clinical Decision Making Stable/Uncomplicated    Rehab Potential Excellent    PT Frequency 2x / week    PT Duration 8 weeks    PT Treatment/Interventions ADLs/Self Care Home Management;Stair training;Gait training;Functional mobility training;Therapeutic activities;Balance training;Therapeutic exercise;Neuromuscular re-education;Patient/family  education;Energy conservation    PT Next Visit Plan high level gait training with incr stride length/arm swing with dual tasking.  postural strengthening.  continue varied direction stepping (adding arm movements); step ups with BUE motions, high level balance on compliant surfaces (BOSU)    PT Home Exercise Plan standing PWR, supine PWR, and LXBLJJGT    Consulted and Agree with Plan of Care Patient           Patient will benefit from skilled therapeutic intervention in order to improve the following deficits and impairments:  Abnormal gait, Decreased balance, Decreased activity tolerance, Difficulty walking, Decreased strength, Decreased coordination  Visit Diagnosis: Other abnormalities of gait and mobility  Abnormal posture  Unsteadiness on feet  Difficulty in walking, not elsewhere classified     Problem List Patient Active Problem List   Diagnosis Date Noted   Low testosterone 06/10/2019   Neck pain 04/19/2017   Anxiety state 04/28/2015   Wellness examination 03/24/2015   Acute pharyngitis 03/30/2014   Intercostal muscle tear 06/20/2013   Nonspecific abnormal electrocardiogram (ECG) (EKG) 03/08/2011   GERD 11/14/2007   ANXIETY STATE NOS 12/11/2006   Hyperglycemia 09/06/2006    Arliss Journey, PT, DPT  01/14/2020, 4:15 PM  [facility] [address] [place], [place], [address] Phone: [phone]   Fax:  [phone]  Name: Mark [name] MRN: [id number] Date of Birth: [birth year]

## 2020-01-15 NOTE — Telephone Encounter (Signed)
Message on covermymeds:  Request Reference Number: [id number]. ROPINIROLE TAB 6MG  ER is approved through 01/13/2021. Your patient may now fill this prescription and it will be covered.

## 2020-01-16 ENCOUNTER — Encounter: Payer: Self-pay | Admitting: Physical Therapy

## 2020-01-16 ENCOUNTER — Other Ambulatory Visit: Payer: Self-pay

## 2020-01-16 ENCOUNTER — Ambulatory Visit: Payer: 59 | Admitting: Physical Therapy

## 2020-01-16 ENCOUNTER — Ambulatory Visit: Payer: 59 | Admitting: Occupational Therapy

## 2020-01-16 DIAGNOSIS — R278 Other lack of coordination: Secondary | ICD-10-CM

## 2020-01-16 DIAGNOSIS — R2681 Unsteadiness on feet: Secondary | ICD-10-CM

## 2020-01-16 DIAGNOSIS — R262 Difficulty in walking, not elsewhere classified: Secondary | ICD-10-CM

## 2020-01-16 DIAGNOSIS — R2689 Other abnormalities of gait and mobility: Secondary | ICD-10-CM | POA: Diagnosis not present

## 2020-01-16 DIAGNOSIS — R29898 Other symptoms and signs involving the musculoskeletal system: Secondary | ICD-10-CM

## 2020-01-16 DIAGNOSIS — R293 Abnormal posture: Secondary | ICD-10-CM

## 2020-01-16 NOTE — Therapy (Signed)
[facility] [address] [place], [place], [address] Phone: [phone]   Fax:  [phone]  Occupational Therapy Treatment  Patient Details  Name: Mark [name] MRN: [id number] Date of Birth: [birth year] Referring Provider (OT): Dr. Frances Furbish    Encounter Date: 01/16/2020   OT End of Session - 01/16/20 1644    Visit Number 2    Number of Visits 17    Date for OT Re-Evaluation 02/13/20    Authorization Type UHC    Authorization Time Period UHC 23 visit limit    Authorization - Visit Number 2    Authorization - Number of Visits 23    OT Start Time 0730    OT Stop Time 0800    OT Time Calculation (min) 30 min           Past Medical History:  Diagnosis Date  . Family history of prostate cancer     DRE WNL; PSA never > 0.38  . Fasting hyperglycemia     PMH of  . GERD (gastroesophageal reflux disease)   . Tinnitus    w/o hearing loss    Past Surgical History:  Procedure Laterality Date  . COLONOSCOPY  05/2013   Neg, Dr Marina Goodell  . KNEE SURGERY Right   . TONSILLECTOMY AND ADENOIDECTOMY    . VASECTOMY      There were no vitals filed for this visit.   Subjective Assessment - 01/16/20 1647    Subjective  Denies pain    Pertinent History Pt is a 59 y.o male with recent diagnosis of Parkinson's disease, PMH significant for : neck pain, reflux disease, chronic tinnitus, anxiety, and allergic rhinitis, hx of COVID in July    Patient Stated Goals improve use of left arm/ hand    Currently in Pain? No/denies                                OT Treatment/Education - 01/16/20 1643    Education Details use of oval 8 splint, beginning coordination HEP, PWR! moves, seated basic 4, 10 reps each, mod v.c and demo    Person(s) Educated Patient;Spouse    Methods Explanation;Demonstration;Verbal cues;Handout    Comprehension Verbalized understanding;Returned demonstration;Verbal cues required             OT Short Term Goals - 12/19/19 1428      OT SHORT TERM GOAL #1   Title I with PD specific HEP    Time 4    Period Weeks    Status New      OT SHORT TERM GOAL #2   Title Pt will verbalize understanding of adapted strategies to maximize safety and I with ADLs/ IADLs .(ie: opening containers, brushing teeth, using screwdriver/ hammer, cutting food)    Time 4    Period Weeks    Status New      OT SHORT TERM GOAL #3   Title Pt will demonstrate improved fine motor coordination for ADLs as evidenced by decreasing 9 hole peg test score for LUE to 75 secs or less    Baseline 93 secs    Time 4    Period Weeks    Status New      OT SHORT TERM GOAL #4   Title Pt will  demonstrate improved LUE functional use for ADLs as evidenced by increasing box/ blocks score by 4 blocks with LUE    Baseline RUE 39, LUE  27    Time 4    Period Weeks    Status New      OT SHORT TERM GOAL #5   Title Pt will write a short paragraph with 100% legibility and no significant decrease in letter size    Time 4    Period Weeks    Status New      OT SHORT TERM GOAL #6   Title check PPT#4 and add goal prn    Time 4    Period Weeks    Status New             OT Long Term Goals - 12/19/19 1434      OT LONG TERM GOAL #1   Title Pt will verbalize understanding of ways to prevent future PD related complications and PD community resources.    Time 8    Period Weeks    Status New    Target Date 02/13/20      OT LONG TERM GOAL #2   Title Pt will demonstrate increased ease with self feeding as evidneced by decreasing PPT#2 to 14 secs or less    Baseline 17.75 secs    Time 8    Period Weeks    Status New      OT LONG TERM GOAL #3   Title Pt will demonstrate ability to retrieve a lightweight object at 125 shoulder flexion and -25 elbow extension with LUE    Baseline LUE shoulder flexion 120, elbow ext -20    Time 8    Period Weeks    Status New      OT LONG TERM GOAL #4   Title Pt will  demonstrate improved ease with dressing as eveidenced by decreasing 3 button/ unbutton time to 70 secs or less    Baseline 81 secs    Time 8    Period Weeks    Status New      OT LONG TERM GOAL #5   Title Pt will demonstrate improved RUE fine motor coordination as eveidenced by decreasing 9 hole peg test score to 45 secs or less    Baseline RUE 51.53 secs, LUE 93 secs    Time 8    Period Weeks    Status New                 Plan - 01/16/20 1645    Clinical Impression Statement Pt is progressing towards goals. Pt/ wife verbalize understanding of inital HEP    Occupational performance deficits (Please refer to evaluation for details): ADL's;IADL's;Leisure;Social Participation;Play;Work    Games developer / Function / Physical Skills ADL;Endurance;UE functional use;Balance;Flexibility;FMC;ROM;Gait;Coordination;GMC;Decreased knowledge of precautions;Decreased knowledge of use of DME;IADL;Strength;Dexterity;Mobility;Tone    Rehab Potential Good    OT Frequency 2x / week    OT Duration 8 weeks    OT Treatment/Interventions Self-care/ADL training;Ultrasound;Energy conservation;Patient/family education;Balance training;Passive range of motion;Gait Training;DME and/or AE instruction;Aquatic Therapy;Fluidtherapy;Splinting;Functional Mobility Training;Moist Heat;Therapeutic exercise;Manual Therapy;Therapeutic activities;Cognitive remediation/compensation;Neuromuscular education;Cryotherapy    Plan check on oval 8, add to coordination exercises    Consulted and Agree with Plan of Care Patient;Family member/caregiver           Patient will benefit from skilled therapeutic intervention in order to improve the following deficits and impairments:   Body Structure / Function / Physical Skills: ADL, Endurance, UE functional use, Balance, Flexibility, FMC, ROM, Gait, Coordination, GMC, Decreased knowledge of precautions, Decreased knowledge of use of DME, IADL, Strength, Dexterity, Mobility, Tone  Visit Diagnosis: Unsteadiness on feet  Other lack of coordination  Other symptoms and signs involving the musculoskeletal system    Problem List Patient Active Problem List   Diagnosis Date Noted  . Low testosterone 06/10/2019  . Neck pain 04/19/2017  . Anxiety state 04/28/2015  . Wellness examination 03/24/2015  . Acute pharyngitis 03/30/2014  . Intercostal muscle tear 06/20/2013  . Nonspecific abnormal electrocardiogram (ECG) (EKG) 03/08/2011  . GERD 11/14/2007  . ANXIETY STATE NOS 12/11/2006  . Hyperglycemia 09/06/2006    Ellyce Lafevers 01/16/2020, 4:47 PM  [facility] [address] [place], [place], [address] Phone: [phone]   Fax:  [phone]  Name: Dory Verdun MRN: [id number] Date of Birth: June 04, 1960

## 2020-01-16 NOTE — Patient Instructions (Signed)
Wear oval 8 splint on left ring finger during daytime only remove if increased pain, splint feels too tight or numbness.   Coordination Exercises  Perform the following exercises for 20 minutes 1 times per day. Perform with both hand(s). Perform using big movements.   Flipping Cards: Place deck of cards on the table. Flip cards over by opening your hand big to grasp and then turn your palm up big.  Deal cards: Hold 1/2 or whole deck in your hand. Use thumb to push card off top of deck with one big push.

## 2020-01-16 NOTE — Therapy (Signed)
[facility] [address] [place], [place], [address] Phone: [phone]   Fax:  [phone]  Physical Therapy Treatment  Patient Details  Name: Mark [name] MRN: [id number] Date of Birth: [birth year] Referring Provider (PT): Star Age, MD   Encounter Date: 01/16/2020   PT End of Session - 01/16/20 0850    Visit Number 9    Number of Visits 17    Date for PT Re-Evaluation 03/18/20   written for 60 day POC   Authorization Type UHC - 23 visit limit PT, 23 visit limit OT, 30 visit limit ST    Authorization - Visit Number 8    Authorization - Number of Visits 23    PT Start Time 0802    PT Stop Time 0847    PT Time Calculation (min) 45 min    Activity Tolerance Patient tolerated treatment well    Behavior During Therapy [facility] for tasks assessed/performed           Past Medical History:  Diagnosis Date  . Family history of prostate cancer     DRE WNL; PSA never > 0.38  . Fasting hyperglycemia     PMH of  . GERD (gastroesophageal reflux disease)   . Tinnitus    w/o hearing loss    Past Surgical History:  Procedure Laterality Date  . COLONOSCOPY  05/2013   Neg, Dr Henrene Pastor  . KNEE SURGERY Right   . TONSILLECTOMY AND ADENOIDECTOMY    . VASECTOMY      There were no vitals filed for this visit.   Subjective Assessment - 01/16/20 0804    Subjective No changes, the forearm is still feeling tight, esp during work tasks.    Patient is accompained by: Family member   wife, [name]   Pertinent History neck pain, reflux disease, chronic tinnitus, anxiety, and allergic rhinitis, hx of COVID in July    Patient Stated Goals wants to get his L arm a little more flexible, improve his balance.    Currently in Pain? No/denies                               NMR: PWR Moves Flow (reviewed as new addition to HEP) Up > Rock > Twist > Step  For 7 reps, intermittent cues for L elbow extension, big hands, and  incr step height during PWR Step.      Balance Exercises - 01/16/20 0001      Balance Exercises: Standing   SLS with Vectors Foam/compliant surface;Limitations    SLS with Vectors Limitations on blue side of BOSU: stepping on with one leg and lifting opposite leg into a high march x12 reps B, progressing from BUE support > none, min guard for balance   Stepping Strategy Posterior;Limitations    Stepping Strategy Limitations with resistance belt around pt:  x10 reps B alternating on level ground, x5 reps B on blue air ex and then x10 reps with adding in B shoulder flexion forwards when stepping backwards - repeated same activity on blue foam beam, cues at times for hip hinge and pt intermittently needing counter for balance    Other Standing Exercises Comments multi directional stepping with arm movements:  Following sheet and adding in arm movements (forward arms stepping backwards, extended arms stepping forwards) and adding in additional cognitive challenge, pt switching directions of R/L, performed multiple reps  PT Education - 01/16/20 0850    Education Details review of PWR Flow to HEP    Person(s) Educated Patient;Spouse    Methods Explanation;Demonstration;Verbal cues    Comprehension Verbalized understanding;Returned demonstration            PT Short Term Goals - 01/14/20 1324      PT SHORT TERM GOAL #1   Title Pt will be independent with initial HEP in order to build upon functional gains made in therapy. ALL STGS DUE 01/16/2020 (corrected date from eval)    Time 4    Period Weeks    Status Achieved      PT SHORT TERM GOAL #2   Title Pt will undergo 6MWT with LTG to be written as appropriate.    Baseline 1345 ft 12/24/2019    Time 4    Period Weeks    Status Achieved      PT SHORT TERM GOAL #3   Title Pt will recover posterior balance in push and release test in 1 step independently, for improved balance recovery    Baseline 2 steps independently for  balance recoverry, 1 step in forward direction.    Time 4    Period Weeks    Status Not Met      PT SHORT TERM GOAL #4   Title Pt will decr 5x sit <> stand time to 12.5 seconds or less in order to demo improved functional BLE strength and transfer efficiency.    Baseline 15.09 seconds with no UE support, 10.88 seconds without BUE support    Time 4    Period Weeks    Status Achieved             PT Long Term Goals - 12/24/19 1848      PT LONG TERM GOAL #1   Title Pt will be independent with final HEP. ALL LTGS DUE 02/15/20    Time 8    Period Weeks    Status New      PT LONG TERM GOAL #2   Title Pt will verbalize understanding of local Parkinson's disease resources, including options for continue community fitness.    Time 8    Period Weeks    Status New      PT LONG TERM GOAL #3   Title Pt will improve miniBEST to at least a 25/28 to demo improved balance and decr fall risk.    Baseline 22/28    Time 8    Period Weeks    Status New      PT LONG TERM GOAL #4   Title 6MWT to improve to at least 1450 ft to demo improved walking endurance and gait efficiency for pt's job.    Baseline 1345 ft 12/24/2019    Time 8    Period Weeks    Status Revised      PT LONG TERM GOAL #5   Title Pt will perform 12 steps with step through pattern and single handrail vs. no handrail with mod I for improved stair negotiation in the house.    Time 8    Period Weeks    Status New                 Plan - 01/16/20 0855    Clinical Impression Statement Reviewed PWR Flow at beginning of session as recent addition to HEP, pt with improved trunk rotation and pivoting on feet today, did need intermittent cues for incr foot clearance with PWR Step. Remainder  of session focused on SLS and stepping strategies for balance, as well as multi-directional stepping with cog dual tasking. Pt tolerated session well, will continue to progress towards LTGs.    Personal Factors and Comorbidities  Comorbidity 3+    Comorbidities PD, hx of neck pain, chronic tinnitus, anxiety.    Examination-Activity Limitations Stand;Locomotion Level;Stairs    Examination-Participation Restrictions Community Activity;Occupation    Stability/Clinical Decision Making Stable/Uncomplicated    Rehab Potential Excellent    PT Frequency 2x / week    PT Duration 8 weeks    PT Treatment/Interventions ADLs/Self Care Home Management;Stair training;Gait training;Functional mobility training;Therapeutic activities;Balance training;Therapeutic exercise;Neuromuscular re-education;Patient/family education;Energy conservation    PT Next Visit Plan high level gait training with incr stride length/arm swing with dual tasking.  postural strengthening.   step ups with BUE motions, high level balance on compliant surfaces (BOSU)-SLS and eyes closed    PT Home Exercise Plan standing PWR, supine PWR, and [id number]    Consulted and Agree with Plan of Care Patient           Patient will benefit from skilled therapeutic intervention in order to improve the following deficits and impairments:  Abnormal gait, Decreased balance, Decreased activity tolerance, Difficulty walking, Decreased strength, Decreased coordination  Visit Diagnosis: Other abnormalities of gait and mobility  Abnormal posture  Unsteadiness on feet  Difficulty in walking, not elsewhere classified     Problem List Patient Active Problem List   Diagnosis Date Noted  . Low testosterone 06/10/2019  . Neck pain 04/19/2017  . Anxiety state 04/28/2015  . Wellness examination 03/24/2015  . Acute pharyngitis 03/30/2014  . Intercostal muscle tear 06/20/2013  . Nonspecific abnormal electrocardiogram (ECG) (EKG) 03/08/2011  . GERD 11/14/2007  . ANXIETY STATE NOS 12/11/2006  . Hyperglycemia 09/06/2006    Arliss Journey, PT, DPT  01/16/2020, 8:57 AM  [facility] [address] [place], [place], [address] Phone: [phone]   Fax:  [phone]  Name: Mark [name] MRN: [id number] Date of Birth: 29-Nov-1960

## 2020-01-19 ENCOUNTER — Encounter: Payer: Self-pay | Admitting: Physical Therapy

## 2020-01-19 ENCOUNTER — Encounter: Payer: Self-pay | Admitting: Occupational Therapy

## 2020-01-19 ENCOUNTER — Ambulatory Visit: Payer: 59 | Admitting: Physical Therapy

## 2020-01-19 ENCOUNTER — Ambulatory Visit: Payer: 59

## 2020-01-19 ENCOUNTER — Ambulatory Visit: Payer: 59 | Admitting: Occupational Therapy

## 2020-01-19 ENCOUNTER — Other Ambulatory Visit: Payer: Self-pay

## 2020-01-19 DIAGNOSIS — R2689 Other abnormalities of gait and mobility: Secondary | ICD-10-CM

## 2020-01-19 DIAGNOSIS — R293 Abnormal posture: Secondary | ICD-10-CM

## 2020-01-19 DIAGNOSIS — R29898 Other symptoms and signs involving the musculoskeletal system: Secondary | ICD-10-CM

## 2020-01-19 DIAGNOSIS — R29818 Other symptoms and signs involving the nervous system: Secondary | ICD-10-CM

## 2020-01-19 DIAGNOSIS — R278 Other lack of coordination: Secondary | ICD-10-CM

## 2020-01-19 DIAGNOSIS — R262 Difficulty in walking, not elsewhere classified: Secondary | ICD-10-CM

## 2020-01-19 DIAGNOSIS — R2681 Unsteadiness on feet: Secondary | ICD-10-CM

## 2020-01-19 DIAGNOSIS — M25612 Stiffness of left shoulder, not elsewhere classified: Secondary | ICD-10-CM

## 2020-01-19 DIAGNOSIS — M25622 Stiffness of left elbow, not elsewhere classified: Secondary | ICD-10-CM

## 2020-01-19 NOTE — Therapy (Addendum)
[facility] [address] [place], [place], [address] Phone: [phone]   Fax:  [phone]  Occupational Therapy Treatment  Patient Details  Name: Mark [name] MRN: [id number] Date of Birth: [birth year] Referring Provider (OT): Dr. Frances Furbish    Encounter Date: 01/19/2020   OT End of Session - 01/19/20 0816    Visit Number 3    Number of Visits 17    Date for OT Re-Evaluation 02/13/20    Authorization Type UHC    Authorization Time Period UHC 23 visit limit OT    Authorization - Visit Number 2    Authorization - Number of Visits 23    OT Start Time [phone]   pt arrived late   OT Stop Time 0848    OT Time Calculation (min) 34 min    Activity Tolerance Patient tolerated treatment well    Behavior During Therapy [facility] for tasks assessed/performed           Past Medical History:  Diagnosis Date  . Family history of prostate cancer     DRE WNL; PSA never > 0.38  . Fasting hyperglycemia     PMH of  . GERD (gastroesophageal reflux disease)   . Tinnitus    w/o hearing loss    Past Surgical History:  Procedure Laterality Date  . COLONOSCOPY  05/2013   Neg, Dr Marina Goodell  . KNEE SURGERY Right   . TONSILLECTOMY AND ADENOIDECTOMY    . VASECTOMY      There were no vitals filed for this visit.   Subjective Assessment - 01/19/20 0815    Subjective  Denies pain, no problems with L oval 8 splint--pt reports that he feels like it helps    Pertinent History Pt is a 59 y.o male with recent diagnosis of Parkinson's disease, PMH significant for : neck pain, reflux disease, chronic tinnitus, anxiety, and allergic rhinitis, hx of COVID in July    Patient Stated Goals improve use of left arm/ hand    Currently in Pain? No/denies              [facility] OT Assessment - 01/19/20 0001      Cognition   Overall Cognitive Status --    Area of Impairment --                    OT Treatments/Exercises (OP) - 01/19/20 0001       Exercises   Exercises Wrist                  OT Education - 01/19/20 0846    Education Details PWR! hands (basic 4):  Coordination HEP (updated) with focus on large amplitude movements--see pt instructions; Use of PWR! hands as strategy for ADLs and use PWR! movements/incr amplitude for brain change    Person(s) Educated Patient    Methods Explanation;Demonstration;Verbal cues;Handout    Comprehension Verbalized understanding;Returned demonstration;Verbal cues required   for large amplitude movements           OT Short Term Goals - 01/19/20 0958      OT SHORT TERM GOAL #1   Title I with PD specific HEP--check STGs 02/06/20 (extended due to scheduling)    Time 4    Period Weeks    Status New      OT SHORT TERM GOAL #2   Title Pt will verbalize understanding of adapted strategies to maximize safety and I with ADLs/ IADLs .(ie: opening containers, brushing teeth, using  screwdriver/ hammer, cutting food)    Time 4    Period Weeks    Status New      OT SHORT TERM GOAL #3   Title Pt will demonstrate improved fine motor coordination for ADLs as evidenced by decreasing 9 hole peg test score for LUE to 75 secs or less    Baseline 93 secs    Time 4    Period Weeks    Status New      OT SHORT TERM GOAL #4   Title Pt will  demonstrate improved LUE functional use for ADLs as evidenced by increasing box/ blocks score by 4 blocks with LUE    Baseline RUE 39, LUE 27    Time 4    Period Weeks    Status New      OT SHORT TERM GOAL #5   Title Pt will write a short paragraph with 100% legibility and no significant decrease in letter size    Time 4    Period Weeks    Status New      OT SHORT TERM GOAL #6   Title check PPT#4 and add goal prn    Time 4    Period Weeks    Status New             OT Long Term Goals - 12/19/19 1434      OT LONG TERM GOAL #1   Title Pt will verbalize understanding of ways to prevent future PD related complications and PD community  resources.    Time 8    Period Weeks    Status New    Target Date 02/13/20      OT LONG TERM GOAL #2   Title Pt will demonstrate increased ease with self feeding as evidneced by decreasing PPT#2 to 14 secs or less    Baseline 17.75 secs    Time 8    Period Weeks    Status New      OT LONG TERM GOAL #3   Title Pt will demonstrate ability to retrieve a lightweight object at 125 shoulder flexion and -25 elbow extension with LUE    Baseline LUE shoulder flexion 120, elbow ext -20    Time 8    Period Weeks    Status New      OT LONG TERM GOAL #4   Title Pt will demonstrate improved ease with dressing as eveidenced by decreasing 3 button/ unbutton time to 70 secs or less    Baseline 81 secs    Time 8    Period Weeks    Status New      OT LONG TERM GOAL #5   Title Pt will demonstrate improved RUE fine motor coordination as eveidenced by decreasing 9 hole peg test score to 45 secs or less    Baseline RUE 51.53 secs, LUE 93 secs    Time 8    Period Weeks    Status New                 Plan - 01/19/20 0949    Clinical Impression Statement Pt demo good response to cueing for large amplitude movement strategies.  He also reports less "catching" with use of oval 8 splint.    Occupational performance deficits (Please refer to evaluation for details): ADL's;IADL's;Leisure;Social Participation;Play;Work    Games developer / Function / Physical Skills ADL;Endurance;UE functional use;Balance;Flexibility;FMC;ROM;Gait;Coordination;GMC;Decreased knowledge of precautions;Decreased knowledge of use of DME;IADL;Strength;Dexterity;Mobility;Tone    Rehab Potential Good  OT Frequency 2x / week    OT Duration 8 weeks    OT Treatment/Interventions Self-care/ADL training;Ultrasound;Energy conservation;Patient/family education;Balance training;Passive range of motion;Gait Training;DME and/or AE instruction;Aquatic Therapy;Fluidtherapy;Splinting;Functional Mobility Training;Moist Heat;Therapeutic  exercise;Manual Therapy;Therapeutic activities;Cognitive remediation/compensation;Neuromuscular education;Cryotherapy    Plan review PWR! moves in sitting and coordination HEP prn    Consulted and Agree with Plan of Care Patient;Family member/caregiver           Patient will benefit from skilled therapeutic intervention in order to improve the following deficits and impairments:   Body Structure / Function / Physical Skills: ADL, Endurance, UE functional use, Balance, Flexibility, FMC, ROM, Gait, Coordination, GMC, Decreased knowledge of precautions, Decreased knowledge of use of DME, IADL, Strength, Dexterity, Mobility, Tone       Visit Diagnosis: Other symptoms and signs involving the nervous system  Other symptoms and signs involving the musculoskeletal system  Other lack of coordination  Stiffness of left shoulder, not elsewhere classified  Stiffness of left elbow, not elsewhere classified  Unsteadiness on feet  Abnormal posture  Other abnormalities of gait and mobility    Problem List Patient Active Problem List   Diagnosis Date Noted  . Low testosterone 06/10/2019  . Neck pain 04/19/2017  . Anxiety state 04/28/2015  . Wellness examination 03/24/2015  . Acute pharyngitis 03/30/2014  . Intercostal muscle tear 06/20/2013  . Nonspecific abnormal electrocardiogram (ECG) (EKG) 03/08/2011  . GERD 11/14/2007  . ANXIETY STATE NOS 12/11/2006  . Hyperglycemia 09/06/2006    [facility] 01/19/2020, 12:57 PM  [facility] [address] [place], [place], [address] Phone: [phone]   Fax:  [phone]  Name: Mark [name] MRN: [id number] Date of Birth: [birth year]   Willa Frater, OTR/L [facility] [address] [place], [place]  [address] [phone] phone [phone] 01/19/20 12:57 PM

## 2020-01-19 NOTE — Patient Instructions (Addendum)
PWR! Hand Exercises  Then, start with elbows bent and hands closed:   PWR! Hands: Push hands out BIG. Elbows straight, wrists up, fingers open and spread apart BIG.   PWR! Step: Touch index finger to thumb while keeping other fingers straight. Flick fingers out BIG (thumb out/straighten fingers). Repeat with other fingers. (Step your thumb to each finger).   With arms stretched out in front of you (elbows straight), perform the following:  PWR! Rock:  Move wrists up and down [employer]! Twist: Twist palms up and down BIG  ** Make each movement big and deliberate so that you feel the movement.  Perform at least 10 repetitions 1x/day, but perform PWR! Hands throughout the day when you are having trouble using your hands (picking up/manipulating small objects, writing, eating, typing, sewing, buttoning, etc.).    Coordination Exercises  Perform the following exercises for 20 minutes 1 times per day. Perform with both hand(s). Perform using big movements.   Flipping Cards: Place deck of cards on the table. Flip cards over by opening your hand big to grasp and then turn your palm up big, opening hand fully to release.  Deal cards: Hold 1/2 or whole deck in your hand. Use thumb to push card off top of deck with one big push.  Pick up coins and stack one at a time: Pick up with big, intentional movements. Do not drag coin to the edge. (5-10 in a stack).  Open hand fully before picking up coin.  Pick up 5-10 coins one at a time and hold in palm. Then, move coins from palm to fingertips one at time and place in coin bank/container.  Practice writing: Slow down, write big, and focus on forming each letter.  Use lined paper and stretch/open hand often.

## 2020-01-19 NOTE — Therapy (Signed)
[facility] [address] [place], [place], [address] Phone: [phone]   Fax:  [phone]  Physical Therapy Treatment  Patient Details  Name: Mark [name] MRN: [id number] Date of Birth: 1960/04/24 Referring Provider (PT): Star Age, MD   Encounter Date: 01/19/2020   PT End of Session - 01/19/20 1158    Visit Number 10    Number of Visits 17    Date for PT Re-Evaluation 03/18/20   written for 60 day POC   Authorization Type UHC - 23 visit limit PT, 23 visit limit OT, 30 visit limit ST    Authorization - Visit Number 9    Authorization - Number of Visits 23    PT Start Time 0932    PT Stop Time 1015    PT Time Calculation (min) 43 min    Equipment Utilized During Treatment Gait belt    Activity Tolerance Patient tolerated treatment well    Behavior During Therapy WFL for tasks assessed/performed           Past Medical History:  Diagnosis Date  . Family history of prostate cancer     DRE WNL; PSA never > 0.38  . Fasting hyperglycemia     PMH of  . GERD (gastroesophageal reflux disease)   . Tinnitus    w/o hearing loss    Past Surgical History:  Procedure Laterality Date  . COLONOSCOPY  05/2013   Neg, Dr Henrene Pastor  . KNEE SURGERY Right   . TONSILLECTOMY AND ADENOIDECTOMY    . VASECTOMY      There were no vitals filed for this visit.   Subjective Assessment - 01/19/20 0934    Subjective Had a busy weekend with basketball games. Neck didn't hurt as much when he focused on sitting up nice and tall. Did the PWR moves flow at home. Took the first dose of his medication before therapy this morning, reports sometimes feeling dizzy afterwards.    Patient is accompained by: Family member   wife, [name]   Pertinent History neck pain, reflux disease, chronic tinnitus, anxiety, and allergic rhinitis, hx of COVID in July    Patient Stated Goals wants to get his L arm a little more flexible, improve his balance.     Currently in Pain? Yes    Pain Score 3     Pain Location Arm   forearm   Pain Orientation Left    Pain Descriptors / Indicators --   tight/rigid   Pain Type Chronic pain    Aggravating Factors  lifting things with L arm    Pain Relieving Factors not working                             [facility] Adult PT Treatment/Exercise - 01/19/20 0001      Exercises   Exercises Other Exercises    Other Exercises  SciFit level 3.5 for 5 minutes for strengthening, ROM, activity tolerance. cues for intensity level at 7/10.                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 8 reps total    Rockerboard Anterior/posterior;Limitations    Rockerboard Limitations head turns x10 reps, head nods x10 reps - cues for posture, intermittent touch to // bars for balance     Tandem Gait Forward;Retro;Intermittent upper extremity support;Foam/compliant surface;Limitations    Tandem Gait Limitations on blue foam beam:  Fwd gait down and back x2 reps then performing fwd tandem and then retro tandem down and back x2 reps with intermittent touch to counter for balance.    Other Standing Exercises Comments on blue foam beam: standing PWR rock x10 reps B, then performing on level ground standing PWR rock with lifting contralateral leg for dynamic SLS x10 reps B, then performing x5 reps B on blue foam beam for additional balance challenge with single UE support on counter.               PT Short Term Goals - 01/14/20 1324      PT SHORT TERM GOAL #1   Title Pt will be independent with initial  HEP in order to build upon functional gains made in therapy. ALL STGS DUE 01/16/2020 (corrected date from eval)    Time 4    Period Weeks    Status Achieved      PT SHORT TERM GOAL #2   Title Pt will undergo 6MWT with LTG to be written as appropriate.    Baseline 1345 ft 12/24/2019    Time 4    Period Weeks    Status Achieved      PT SHORT TERM GOAL #3   Title Pt will recover posterior balance in push and release test in 1 step independently, for improved balance recovery    Baseline 2 steps independently for balance recoverry, 1 step in forward direction.    Time 4    Period Weeks    Status Not Met      PT SHORT TERM GOAL #4   Title Pt will decr 5x sit <> stand time to 12.5 seconds or less in order to demo improved functional BLE strength and transfer efficiency.    Baseline 15.09 seconds with no UE support, 10.88 seconds without BUE support    Time 4    Period Weeks    Status Achieved             PT Long Term Goals - 12/24/19 1848      PT LONG TERM GOAL #1   Title Pt will be independent with final HEP. ALL LTGS DUE 02/15/20    Time 8    Period Weeks    Status New      PT LONG TERM GOAL #2   Title Pt will verbalize understanding of local Parkinson's disease resources, including options for continue community fitness.    Time 8    Period Weeks    Status New      PT LONG TERM GOAL #3   Title Pt will improve miniBEST to at least a 25/28 to demo improved balance and decr fall risk.    Baseline 22/28    Time 8    Period Weeks    Status New      PT LONG TERM GOAL #4   Title 6MWT to improve to at least 1450 ft to demo improved walking endurance and gait efficiency for pt's job.    Baseline 1345 ft 12/24/2019    Time 8    Period Weeks    Status Revised  PT LONG TERM GOAL #5   Title Pt will perform 12 steps with step through pattern and single handrail vs. no handrail with mod I for improved stair negotiation in the house.    Time 8    Period Weeks    Status  New                 Plan - 01/19/20 1214    Clinical Impression Statement Today's skilled session continued to focus on dynamic balance on compliant surfaces. Pt tolerated session well, improving with balance with decr UE support with incr reps. Pt needing intermittent cues at times for L elbow extension and big hands during activities. Will continue to progress towards LTGs.    Personal Factors and Comorbidities Comorbidity 3+    Comorbidities PD, hx of neck pain, chronic tinnitus, anxiety.    Examination-Activity Limitations Stand;Locomotion Level;Stairs    Examination-Participation Restrictions Community Activity;Occupation    Stability/Clinical Decision Making Stable/Uncomplicated    Rehab Potential Excellent    PT Frequency 2x / week    PT Duration 8 weeks    PT Treatment/Interventions ADLs/Self Care Home Management;Stair training;Gait training;Functional mobility training;Therapeutic activities;Balance training;Therapeutic exercise;Neuromuscular re-education;Patient/family education;Energy conservation    PT Next Visit Plan high level gait training with incr stride length/arm swing with dual tasking (try with scarves).  postural strengthening.   step ups with BUE motions, high level balance on compliant surfaces (BOSU)-SLS and eyes closed    PT Home Exercise Plan standing PWR, supine PWR, and [id number]    Consulted and Agree with Plan of Care Patient           Patient will benefit from skilled therapeutic intervention in order to improve the following deficits and impairments:  Abnormal gait, Decreased balance, Decreased activity tolerance, Difficulty walking, Decreased strength, Decreased coordination  Visit Diagnosis: Other abnormalities of gait and mobility  Abnormal posture  Unsteadiness on feet  Difficulty in walking, not elsewhere classified     Problem List Patient Active Problem List   Diagnosis Date Noted  . Low testosterone 06/10/2019  . Neck pain  04/19/2017  . Anxiety state 04/28/2015  . Wellness examination 03/24/2015  . Acute pharyngitis 03/30/2014  . Intercostal muscle tear 06/20/2013  . Nonspecific abnormal electrocardiogram (ECG) (EKG) 03/08/2011  . GERD 11/14/2007  . ANXIETY STATE NOS 12/11/2006  . Hyperglycemia 09/06/2006    Arliss Journey, PT, DPT  01/19/2020, 12:15 PM  [facility] [address] [place], [place], [address] Phone: [phone]   Fax:  [phone]  Name: Mark [name] MRN: [id number] Date of Birth: 08-26-1960

## 2020-01-21 ENCOUNTER — Encounter: Payer: Self-pay | Admitting: Occupational Therapy

## 2020-01-21 ENCOUNTER — Ambulatory Visit: Payer: 59 | Admitting: Speech Pathology

## 2020-01-21 ENCOUNTER — Ambulatory Visit: Payer: 59 | Admitting: Occupational Therapy

## 2020-01-21 ENCOUNTER — Other Ambulatory Visit: Payer: Self-pay

## 2020-01-21 ENCOUNTER — Encounter: Payer: Self-pay | Admitting: Physical Therapy

## 2020-01-21 ENCOUNTER — Ambulatory Visit: Payer: 59 | Admitting: Physical Therapy

## 2020-01-21 ENCOUNTER — Encounter: Payer: Self-pay | Admitting: Speech Pathology

## 2020-01-21 DIAGNOSIS — R2681 Unsteadiness on feet: Secondary | ICD-10-CM

## 2020-01-21 DIAGNOSIS — R278 Other lack of coordination: Secondary | ICD-10-CM

## 2020-01-21 DIAGNOSIS — R29898 Other symptoms and signs involving the musculoskeletal system: Secondary | ICD-10-CM

## 2020-01-21 DIAGNOSIS — R29818 Other symptoms and signs involving the nervous system: Secondary | ICD-10-CM

## 2020-01-21 DIAGNOSIS — R2689 Other abnormalities of gait and mobility: Secondary | ICD-10-CM

## 2020-01-21 DIAGNOSIS — M25612 Stiffness of left shoulder, not elsewhere classified: Secondary | ICD-10-CM

## 2020-01-21 DIAGNOSIS — M25622 Stiffness of left elbow, not elsewhere classified: Secondary | ICD-10-CM

## 2020-01-21 DIAGNOSIS — R471 Dysarthria and anarthria: Secondary | ICD-10-CM

## 2020-01-21 DIAGNOSIS — R293 Abnormal posture: Secondary | ICD-10-CM

## 2020-01-21 NOTE — Therapy (Signed)
[facility] [address] [place], [place], [address] Phone: [phone]   Fax:  [phone]  Occupational Therapy Treatment  Patient Details  Name: Mark [name] MRN: [id number] Date of Birth: 11-23-1960 Referring Provider (OT): Dr. Frances Furbish    Encounter Date: 01/21/2020   OT End of Session - 01/21/20 1624    Visit Number 4    Number of Visits 17    Date for OT Re-Evaluation 02/13/20    Authorization Type UHC    Authorization Time Period UHC 23 visit limit OT    Authorization - Visit Number 4    Authorization - Number of Visits 23    OT Start Time 1320    OT Stop Time 1400    OT Time Calculation (min) 40 min    Activity Tolerance Patient tolerated treatment well    Behavior During Therapy WFL for tasks assessed/performed           Past Medical History:  Diagnosis Date  . Family history of prostate cancer     DRE WNL; PSA never > 0.38  . Fasting hyperglycemia     PMH of  . GERD (gastroesophageal reflux disease)   . Tinnitus    w/o hearing loss    Past Surgical History:  Procedure Laterality Date  . COLONOSCOPY  05/2013   Neg, Dr Marina Goodell  . KNEE SURGERY Right   . TONSILLECTOMY AND ADENOIDECTOMY    . VASECTOMY      There were no vitals filed for this visit.   Subjective Assessment - 01/21/20 1624    Subjective  Denies pain, no problem with oval 8    Pertinent History Pt is a 59 y.o male with recent diagnosis of Parkinson's disease, PMH significant for : neck pain, reflux disease, chronic tinnitus, anxiety, and allergic rhinitis, hx of COVID in July    Patient Stated Goals improve use of left arm/ hand    Currently in Pain? No/denies                                OT Treatment/ Education - 01/21/20 1613    Education Details PWR! basic 4 in quadraped, 10 reps each, min vc. for larger amplitude movements, adapted strategies for: opening containers, cutting food, feeding and brushing  teeth with larger amplitude movements. Foam grips provided for increased ease with self feeding. :    Person(s) Educated Patient    Methods Explanation;Demonstration;Verbal cues;Handout    Comprehension Verbalized understanding;Returned demonstration;Verbal cues required            OT Short Term Goals - 01/21/20 1339      OT SHORT TERM GOAL #1   Title I with PD specific HEP--check STGs 02/06/20 (extended due to scheduling)    Time 4    Period Weeks    Status On-going      OT SHORT TERM GOAL #2   Title Pt will verbalize understanding of adapted strategies to maximize safety and I with ADLs/ IADLs .(ie: opening containers, brushing teeth, using screwdriver/ hammer, cutting food)    Time 4    Period Weeks    Status On-going      OT SHORT TERM GOAL #3   Title Pt will demonstrate improved fine motor coordination for ADLs as evidenced by decreasing 9 hole peg test score for LUE to 75 secs or less    Baseline 93 secs    Time 4  Period Weeks    Status New      OT SHORT TERM GOAL #4   Title Pt will  demonstrate improved LUE functional use for ADLs as evidenced by increasing box/ blocks score by 4 blocks with LUE    Baseline RUE 39, LUE 27    Time 4    Period Weeks    Status New      OT SHORT TERM GOAL #5   Title Pt will write a short paragraph with 100% legibility and no significant decrease in letter size    Time 4    Period Weeks    Status New      OT SHORT TERM GOAL #6   Title check PPT#4 and add goal prn    Time 4    Period Weeks    Status New             OT Long Term Goals - 12/19/19 1434      OT LONG TERM GOAL #1   Title Pt will verbalize understanding of ways to prevent future PD related complications and PD community resources.    Time 8    Period Weeks    Status New    Target Date 02/13/20      OT LONG TERM GOAL #2   Title Pt will demonstrate increased ease with self feeding as evidneced by decreasing PPT#2 to 14 secs or less    Baseline 17.75 secs     Time 8    Period Weeks    Status New      OT LONG TERM GOAL #3   Title Pt will demonstrate ability to retrieve a lightweight object at 125 shoulder flexion and -25 elbow extension with LUE    Baseline LUE shoulder flexion 120, elbow ext -20    Time 8    Period Weeks    Status New      OT LONG TERM GOAL #4   Title Pt will demonstrate improved ease with dressing as eveidenced by decreasing 3 button/ unbutton time to 70 secs or less    Baseline 81 secs    Time 8    Period Weeks    Status New      OT LONG TERM GOAL #5   Title Pt will demonstrate improved RUE fine motor coordination as eveidenced by decreasing 9 hole peg test score to 45 secs or less    Baseline RUE 51.53 secs, LUE 93 secs    Time 8    Period Weeks    Status New                 Plan - 01/21/20 1621    Clinical Impression Statement Pt is progressing towards goals. He demonstrates understanding of PWR! basic 4 in quadraped.    Occupational performance deficits (Please refer to evaluation for details): ADL's;IADL's;Leisure;Social Participation;Play;Work    Games developer / Function / Physical Skills ADL;Endurance;UE functional use;Balance;Flexibility;FMC;ROM;Gait;Coordination;GMC;Decreased knowledge of precautions;Decreased knowledge of use of DME;IADL;Strength;Dexterity;Mobility;Tone    Rehab Potential Good    OT Frequency 2x / week    OT Duration 8 weeks    OT Treatment/Interventions Self-care/ADL training;Ultrasound;Energy conservation;Patient/family education;Balance training;Passive range of motion;Gait Training;DME and/or AE instruction;Aquatic Therapy;Fluidtherapy;Splinting;Functional Mobility Training;Moist Heat;Therapeutic exercise;Manual Therapy;Therapeutic activities;Cognitive remediation/compensation;Neuromuscular education;Cryotherapy    Plan review PWR! moves in quadraped or  sitting and reveiw  coordination HEP prn    Consulted and Agree with Plan of Care Patient;Family member/caregiver  Patient will benefit from skilled therapeutic intervention in order to improve the following deficits and impairments:   Body Structure / Function / Physical Skills: ADL, Endurance, UE functional use, Balance, Flexibility, FMC, ROM, Gait, Coordination, GMC, Decreased knowledge of precautions, Decreased knowledge of use of DME, IADL, Strength, Dexterity, Mobility, Tone       Visit Diagnosis: Other symptoms and signs involving the nervous system  Other symptoms and signs involving the musculoskeletal system  Other lack of coordination  Stiffness of left shoulder, not elsewhere classified  Stiffness of left elbow, not elsewhere classified  Abnormal posture    Problem List Patient Active Problem List   Diagnosis Date Noted  . Low testosterone 06/10/2019  . Neck pain 04/19/2017  . Anxiety state 04/28/2015  . Wellness examination 03/24/2015  . Acute pharyngitis 03/30/2014  . Intercostal muscle tear 06/20/2013  . Nonspecific abnormal electrocardiogram (ECG) (EKG) 03/08/2011  . GERD 11/14/2007  . ANXIETY STATE NOS 12/11/2006  . Hyperglycemia 09/06/2006    Almus Woodham 01/21/2020, 4:25 PM  [facility] [address] [place], [place], [address] Phone: [phone]   Fax:  [phone]  Name: Mark [name] MRN: [id number] Date of Birth: [birth year]

## 2020-01-21 NOTE — Therapy (Signed)
[facility] [address] [place], [place], [address] Phone: [phone]   Fax:  [phone]  Speech Language Pathology Treatment  Patient Details  Name: Mark [name] MRN: [id number] Date of Birth: 02-27-1960 Referring Provider (SLP): Huston Foley, MD   Encounter Date: 01/21/2020   End of Session - 01/21/20 1617    Visit Number 3    Number of Visits 17    Date for SLP Re-Evaluation 03/25/20    SLP Start Time 1402    SLP Stop Time  1443    SLP Time Calculation (min) 41 min    Activity Tolerance Patient tolerated treatment well           Past Medical History:  Diagnosis Date  . Family history of prostate cancer     DRE WNL; PSA never > 0.38  . Fasting hyperglycemia     PMH of  . GERD (gastroesophageal reflux disease)   . Tinnitus    w/o hearing loss    Past Surgical History:  Procedure Laterality Date  . COLONOSCOPY  05/2013   Neg, Dr Marina Goodell  . KNEE SURGERY Right   . TONSILLECTOMY AND ADENOIDECTOMY    . VASECTOMY      There were no vitals filed for this visit.   Subjective Assessment - 01/21/20 1405    Subjective "I think it's improved a little bit"    Currently in Pain? No/denies                 ADULT SLP TREATMENT - 01/21/20 1406      General Information   Behavior/Cognition Alert;Cooperative;Pleasant mood      Treatment Provided   Treatment provided Cognitive-Linquistic      Cognitive-Linquistic Treatment   Treatment focused on Dysarthria;Patient/family/caregiver education    Skilled Treatment Pt reports reading aloud every day, but not doing structured tasks everyday.   Trained pt in loud /a/ to recalibrate volume with average of 84dB with usual visual and verbal cues and modeling. In structured task and converation, Mark [name]  maintained average of 70dB and intelligiible rate with occasional visual cues. He reports he"catches" himself when he talks fast and is correcting rate. Modeled  strategy of over articulation and feeling too slow to help reduce rate of speech      Assessment / Recommendations / Plan   Plan Continue with current plan of care      Progression Toward Goals   Progression toward goals Progressing toward goals            SLP Education - 01/21/20 1606    Education Details loud /a/, compensations for dysarthria    Person(s) Educated Patient    Methods Explanation;Demonstration;Verbal cues;Handout    Comprehension Verbalized understanding;Returned demonstration;Verbal cues required            SLP Short Term Goals - 01/21/20 1616      SLP SHORT TERM GOAL #1   Title pt will maintain WNL volume for 10 minutes simple conversation x2 sessions    Time 3    Period Weeks    Status On-going      SLP SHORT TERM GOAL #2   Title pt will produce "ah" with at least 90dB average for 3 sessions    Time 3    Period Weeks    Status On-going      SLP SHORT TERM GOAL #3   Title pt will undergo objective swallow eval    Time 3    Period Weeks  Status On-going      SLP SHORT TERM GOAL #4   Title pt will demo articulatory HEP for speech compensations (slowed rate, overarticulation, etc) in 3 sessions    Time 3    Period Weeks    Status On-going      SLP SHORT TERM GOAL #5   Title pt will demo abdominal breathing in 10 minutes simple conversation in 2 sessions    Time 3    Period Weeks    Status On-going            SLP Long Term Goals - 01/21/20 1617      SLP LONG TERM GOAL #1   Title pt will engage in 15 minutes mod complex conversation with average in low 70s dB in 3 sessions    Time 7    Period Weeks   or 17 sessions, for all LTGS   Status On-going      SLP LONG TERM GOAL #2   Title pt will produce loud "ah" with average low 90s dB in 6 sessions    Time 7    Period Weeks    Status On-going      SLP LONG TERM GOAL #3   Title pt will demo <3 rushes of speech which detract listener from pt's message in 15 minutes mod complex  conversation in 3 sessions    Time 7    Period Weeks    Status On-going      SLP LONG TERM GOAL #4   Title pt will demo abdominal breathing 75% in 15 minutes mod complex conversation in 3 sessions    Time 7    Period Weeks    Status On-going            Plan - 01/21/20 1607    Clinical Impression Statement "Mark [name]" presents today with 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 Short rushes of speech occasionally noted which ceased when pt focusing on slower rate today. Objective swallow eval during this therapy course (MBSS) was attempted to be scheduled yesterday but Altamese Wapello states her call went directly to voicemail. Pt talked at suboptimal dB (upper 60s dB average after 3-4 minutes) until SLP told pt of his softer speech then pt incr'd loudness to WNL average more frequently. Mark [name] tells SLP that his decr'd volume and rushes of speech have  negatively affected his communcation at work, and he has noticed his family telling him more often that they cannot understand him. SLP believes pt will benefit from skilled ST targeting incr'd volume and greater usage of abdomoinal breathing in order to improve communicative effectiveness.pt will produce loud /a/ or "hey!" with at least low 90s dB average over three sessions    Speech Therapy Frequency 2x / week    Duration 8 weeks   17 visits   Treatment/Interventions Aspiration precaution training;Pharyngeal strengthening exercises;Diet toleration management by SLP;Trials of upgraded texture/liquids;Cueing hierarchy;Internal/external aids;Patient/family education;Compensatory strategies;SLP instruction and feedback;Functional tasks;Environmental controls           Patient will benefit from skilled therapeutic intervention in order to improve the following deficits and impairments:   Dysarthria and anarthria    Problem List Patient Active Problem List   Diagnosis Date Noted  . Low testosterone 06/10/2019  . Neck pain  04/19/2017  . Anxiety state 04/28/2015  . Wellness examination 03/24/2015  . Acute pharyngitis 03/30/2014  . Intercostal muscle tear 06/20/2013  . Nonspecific abnormal electrocardiogram (ECG) (EKG) 03/08/2011  . GERD 11/14/2007  .  ANXIETY STATE NOS 12/11/2006  . Hyperglycemia 09/06/2006    Tauriel Scronce, Radene Journey MS, CCC-SLP 01/21/2020, 4:18 PM  [facility] [address] [place], [place], [address] Phone: [phone]   Fax:  [phone]   Name: Muath Hallam MRN: [id number] Date of Birth: 1961-01-12

## 2020-01-21 NOTE — Patient Instructions (Addendum)
   Record when you practice at home, record on your phone a judge how you sound  It's OK to feel like you are talking too slow - many times you sound normal, but feel too slow. This is how you know you are making the effort to slow down  Loud AH! 5x twice a day  Then read aloud focusing on volume and slow rate  You and your wife come up with a signal for when you need to slow down and one for get louder   This will help you get feedback with out being interrupted or calling attention to it in front of others  Over enunnciate, feel like you are talking too slow, feel like you are talking too loud

## 2020-01-22 NOTE — Therapy (Signed)
[facility] [address] [place], [place], [address] Phone: [phone]   Fax:  [phone]  Physical Therapy Treatment  Patient Details  Name: Barbara Keng MRN: [id number] Date of Birth: April 18, 1960 Referring Provider (PT): Star Age, MD   Encounter Date: 01/21/2020   PT End of Session - 01/22/20 0915    Visit Number 11    Number of Visits 17    Date for PT Re-Evaluation 03/18/20   written for 60 day POC   Authorization Type UHC - 23 visit limit PT, 23 visit limit OT, 30 visit limit ST    Authorization - Visit Number 10    Authorization - Number of Visits 23    PT Start Time 9678    PT Stop Time 1529    PT Time Calculation (min) 43 min    Equipment Utilized During Treatment Gait belt    Activity Tolerance Patient tolerated treatment well    Behavior During Therapy WFL for tasks assessed/performed           Past Medical History:  Diagnosis Date  . Family history of prostate cancer     DRE WNL; PSA never > 0.38  . Fasting hyperglycemia     PMH of  . GERD (gastroesophageal reflux disease)   . Tinnitus    w/o hearing loss    Past Surgical History:  Procedure Laterality Date  . COLONOSCOPY  05/2013   Neg, Dr Henrene Pastor  . KNEE SURGERY Right   . TONSILLECTOMY AND ADENOIDECTOMY    . VASECTOMY      There were no vitals filed for this visit.   Subjective Assessment - 01/21/20 1448    Subjective No changes from Monday. Was given quadraped PWR moves from OT today for home. Exercises are going well at home.    Patient is accompained by: Family member   wife, [name]   Pertinent History neck pain, reflux disease, chronic tinnitus, anxiety, and allergic rhinitis, hx of COVID in July    Patient Stated Goals wants to get his L arm a little more flexible, improve his balance.    Currently in Pain? No/denies                             [facility] Adult PT Treatment/Exercise - 01/22/20 0001       Ambulation/Gait   Ambulation/Gait Yes    Ambulation/Gait Assistance 5: Supervision    Ambulation/Gait Assistance Details between activities working on gait with incr arm swing esp with LUE, then performing 115' lap of gait with reciprocal scarf toss and big catch for incr intensity of movement. 2 instances of needing to stop and then restart reciprocal pattern    Ambulation Distance (Feet) 115 Feet    Assistive device None    Gait Pattern Decreased arm swing - left;Decreased arm swing - right;Step-through pattern;Decreased stride length;Decreased trunk rotation    Ambulation Surface Indoor;Level                01/21/20 0001  Balance Exercises: Standing  Tandem Stance Eyes open;Limitations  Tandem Stance Time With RLE posteriorly:  boomwhacker with trunk rotation, cues for full ROM 2 x 5 reps B, then performing 3 x 5 reps with LLE posteriorly (incr difficulty initially but improving with incr reps), then performed with LLE posteriorly lifting arms overhead and back to midline and performing with head nods.   SLS with Vectors Foam/compliant surface;Limitations  SLS with Vectors  Limitations alternating marching on blue side of BOSU x12 reps B beginning with UE support > intermittent, then alternating marching on rockerboard in A/P direction with reciprocal arm raise x12 reps B   Other Standing Exercises on black side of BOSU: A/P weight shifting onto heels and then toes x10 reps  , M/L weight shifting x10 reps with cues for incr weight shift to L, x10 reps mini squats with cues for tall posture and scap retraction in standing        Pt performs PWR! Moves inSUPINEposition  PWR! Up for improved posture- with arms extended into shoulder flexion and big, wide hands and bringing elbows down by chest x10 reps  PWR! Rock for improved weighshifting-x10 reps B, pt with improved intensity of movement   PWR! Twist for improved trunk rotation-x10 reps B, cues to help use legs to twistand  to reset in middle with tall posture before twisting to opposite side, pt holding boomwhacker as cue for incr intensity of movement while twisting   PWR! Step for improved step initiation-x5 reps B step out and out and then big scoot/bridge      PT Short Term Goals - 01/14/20 1324      PT SHORT TERM GOAL #1   Title Pt will be independent with initial HEP in order to build upon functional gains made in therapy. ALL STGS DUE 01/16/2020 (corrected date from eval)    Time 4    Period Weeks    Status Achieved      PT SHORT TERM GOAL #2   Title Pt will undergo 6MWT with LTG to be written as appropriate.    Baseline 1345 ft 12/24/2019    Time 4    Period Weeks    Status Achieved      PT SHORT TERM GOAL #3   Title Pt will recover posterior balance in push and release test in 1 step independently, for improved balance recovery    Baseline 2 steps independently for balance recoverry, 1 step in forward direction.    Time 4    Period Weeks    Status Not Met      PT SHORT TERM GOAL #4   Title Pt will decr 5x sit <> stand time to 12.5 seconds or less in order to demo improved functional BLE strength and transfer efficiency.    Baseline 15.09 seconds with no UE support, 10.88 seconds without BUE support    Time 4    Period Weeks    Status Achieved             PT Long Term Goals - 12/24/19 1848      PT LONG TERM GOAL #1   Title Pt will be independent with final HEP. ALL LTGS DUE 02/15/20    Time 8    Period Weeks    Status New      PT LONG TERM GOAL #2   Title Pt will verbalize understanding of local Parkinson's disease resources, including options for continue community fitness.    Time 8    Period Weeks    Status New      PT LONG TERM GOAL #3   Title Pt will improve miniBEST to at least a 25/28 to demo improved balance and decr fall risk.    Baseline 22/28    Time 8    Period Weeks    Status New      PT LONG TERM GOAL #4   Title 6MWT to improve to at  least 1450 ft to  demo improved walking endurance and gait efficiency for pt's job.    Baseline 1345 ft 12/24/2019    Time 8    Period Weeks    Status Revised      PT LONG TERM GOAL #5   Title Pt will perform 12 steps with step through pattern and single handrail vs. no handrail with mod I for improved stair negotiation in the house.    Time 8    Period Weeks    Status New                 Plan - 01/22/20 6301    Clinical Impression Statement Today's skilled session continued to focus on balance on compliant surfaces with pt with more difficulty with SLS activities on LLE on unlevel surfaces. Pt did well today with coordination activities with scarf toss during gait for improved B arm swing, needed 2 instances to stop and reset due to losing reciprocal pattern. Will continue to progress towards LTGs.    Personal Factors and Comorbidities Comorbidity 3+    Comorbidities PD, hx of neck pain, chronic tinnitus, anxiety.    Examination-Activity Limitations Stand;Locomotion Level;Stairs    Examination-Participation Restrictions Community Activity;Occupation    Stability/Clinical Decision Making Stable/Uncomplicated    Rehab Potential Excellent    PT Frequency 2x / week    PT Duration 8 weeks    PT Treatment/Interventions ADLs/Self Care Home Management;Stair training;Gait training;Functional mobility training;Therapeutic activities;Balance training;Therapeutic exercise;Neuromuscular re-education;Patient/family education;Energy conservation    PT Next Visit Plan obstacle course. high level gait training with incr stride length/arm swing with dual tasking. postural strengthening.   step ups with BUE motions, high level balance on compliant surfaces (BOSU)-SLS and eyes closed    PT Home Exercise Plan standing PWR, supine PWR, and [email or web address]    Consulted and Agree with Plan of Care Patient           Patient will benefit from skilled therapeutic intervention in order to improve the following deficits and  impairments:  Abnormal gait,Decreased balance,Decreased activity tolerance,Difficulty walking,Decreased strength,Decreased coordination  Visit Diagnosis: Other symptoms and signs involving the nervous system  Other symptoms and signs involving the musculoskeletal system  Other lack of coordination  Unsteadiness on feet  Other abnormalities of gait and mobility     Problem List Patient Active Problem List   Diagnosis Date Noted  . Low testosterone 06/10/2019  . Neck pain 04/19/2017  . Anxiety state 04/28/2015  . Wellness examination 03/24/2015  . Acute pharyngitis 03/30/2014  . Intercostal muscle tear 06/20/2013  . Nonspecific abnormal electrocardiogram (ECG) (EKG) 03/08/2011  . GERD 11/14/2007  . ANXIETY STATE NOS 12/11/2006  . Hyperglycemia 09/06/2006    Arliss Journey, PT, DPT  01/22/2020, 9:29 AM  [facility] [address] [place], [place], [address] Phone: [phone]   Fax:  [phone]  Name: Thermon Zulauf MRN: [id number] Date of Birth: 08-24-1960

## 2020-01-25 ENCOUNTER — Encounter: Payer: Self-pay | Admitting: Occupational Therapy

## 2020-01-25 ENCOUNTER — Encounter: Payer: Self-pay | Admitting: Physical Therapy

## 2020-01-26 ENCOUNTER — Ambulatory Visit: Payer: 59

## 2020-01-26 ENCOUNTER — Ambulatory Visit: Payer: 59 | Admitting: Occupational Therapy

## 2020-01-26 ENCOUNTER — Ambulatory Visit: Payer: 59 | Admitting: Physical Therapy

## 2020-01-28 ENCOUNTER — Ambulatory Visit: Payer: 59 | Admitting: Occupational Therapy

## 2020-01-28 ENCOUNTER — Ambulatory Visit: Payer: 59

## 2020-01-28 ENCOUNTER — Ambulatory Visit: Payer: 59 | Admitting: Physical Therapy

## 2020-02-04 ENCOUNTER — Ambulatory Visit: Payer: 59 | Admitting: Physical Therapy

## 2020-02-04 ENCOUNTER — Ambulatory Visit: Payer: 59 | Admitting: Occupational Therapy

## 2020-02-04 ENCOUNTER — Encounter: Payer: Self-pay | Admitting: Physical Therapy

## 2020-02-04 ENCOUNTER — Other Ambulatory Visit: Payer: Self-pay

## 2020-02-04 ENCOUNTER — Ambulatory Visit: Payer: 59

## 2020-02-04 DIAGNOSIS — R2681 Unsteadiness on feet: Secondary | ICD-10-CM

## 2020-02-04 DIAGNOSIS — R471 Dysarthria and anarthria: Secondary | ICD-10-CM

## 2020-02-04 DIAGNOSIS — R278 Other lack of coordination: Secondary | ICD-10-CM

## 2020-02-04 DIAGNOSIS — M25612 Stiffness of left shoulder, not elsewhere classified: Secondary | ICD-10-CM

## 2020-02-04 DIAGNOSIS — R29818 Other symptoms and signs involving the nervous system: Secondary | ICD-10-CM

## 2020-02-04 DIAGNOSIS — R29898 Other symptoms and signs involving the musculoskeletal system: Secondary | ICD-10-CM

## 2020-02-04 DIAGNOSIS — R1312 Dysphagia, oropharyngeal phase: Secondary | ICD-10-CM

## 2020-02-04 DIAGNOSIS — R293 Abnormal posture: Secondary | ICD-10-CM

## 2020-02-04 DIAGNOSIS — M25622 Stiffness of left elbow, not elsewhere classified: Secondary | ICD-10-CM

## 2020-02-04 DIAGNOSIS — R2689 Other abnormalities of gait and mobility: Secondary | ICD-10-CM | POA: Diagnosis not present

## 2020-02-04 NOTE — Patient Instructions (Signed)
   EVERY DAY! Abdominal breathing - practice 15 minutes in lying down position Turn over a 32 oz cup and place on your belly near your navel - it should rise when you inhale, and fall when you relax (exhale).  Loud "ah" 5 times, twice a day If you start too high ("This is higher than I would normally talk"), stop and start at a lower note.   =========================================================   Modified Barium Swallow Exam Details:  Test is at 11:30 - we ask that arrival time is at 11:15am.   Entrance A - utilize valet parking (free). Check in with guest services then volunteer services will take him down to radiology.  There are no restrictions so patient can eat and drink the night before and morning off.

## 2020-02-04 NOTE — Therapy (Signed)
[facility] [address] [place], [place], [address] Phone: [phone]   Fax:  [phone]  Physical Therapy Treatment  Patient Details  Name: Mark [name] MRN: [id number] Date of Birth: 04-04-1960 Referring Provider (PT): Star Age, MD   Encounter Date: 02/04/2020   PT End of Session - 02/04/20 1454    Visit Number 12    Number of Visits 17    Date for PT Re-Evaluation 03/18/20   written for 60 day POC   Authorization Type UHC - 23 visit limit PT, 23 visit limit OT, 30 visit limit ST    Authorization - Visit Number 10    Authorization - Number of Visits 23    PT Start Time 1245   therapist running late   PT Stop Time 1452    PT Time Calculation (min) 45 min    Equipment Utilized During Treatment Gait belt    Activity Tolerance Patient tolerated treatment well    Behavior During Therapy [facility] for tasks assessed/performed           Past Medical History:  Diagnosis Date  . Family history of prostate cancer     DRE WNL; PSA never > 0.38  . Fasting hyperglycemia     PMH of  . GERD (gastroesophageal reflux disease)   . Tinnitus    w/o hearing loss    Past Surgical History:  Procedure Laterality Date  . COLONOSCOPY  05/2013   Neg, Dr Henrene Pastor  . KNEE SURGERY Right   . TONSILLECTOMY AND ADENOIDECTOMY    . VASECTOMY      There were no vitals filed for this visit.   Subjective Assessment - 02/04/20 1410    Subjective Had a really busy week last week and had to miss his visits. Pt reports that the back of his neck is getting worse in the evening from all the walking and exercises.    Patient is accompained by: Family member   wife, [name]   Pertinent History neck pain, reflux disease, chronic tinnitus, anxiety, and allergic rhinitis, hx of COVID in July    Patient Stated Goals wants to get his L arm a little more flexible, improve his balance.    Currently in Pain? No/denies              [facility] PT  Assessment - 02/04/20 1415      Mini-BESTest   Sit To Stand Normal: Comes to stand without use of hands and stabilizes independently.    Rise to Toes Normal: Stable for 3 s with maximum height.    Stand on one leg (left) Moderate: < 20 s   1.9, 1.5   Stand on one leg (right) Moderate: < 20 s   2.47, 10.43   Stand on one leg - lowest score 1    Compensatory Stepping Correction - Forward Normal: Recovers independently with a single, large step (second realignement is allowed).    Compensatory Stepping Correction - Backward Normal: Recovers independently with a single, large step    Compensatory Stepping Correction - Left Lateral Normal: Recovers independently with 1 step (crossover or lateral OK)    Compensatory Stepping Correction - Right Lateral Normal: Recovers independently with 1 step (crossover or lateral OK)    Stepping Corredtion Lateral - lowest score 2    Stance - Feet together, eyes open, firm surface  Normal: 30s    Stance - Feet together, eyes closed, foam surface  Moderate: < 30s   14.15  Incline - Eyes Closed Normal: Stands independently 30s and aligns with gravity    Change in Gait Speed Normal: Significantly changes walkling speed without imbalance    Walk with head turns - Horizontal Normal: performs head turns with no change in gait speed and good balance    Walk with pivot turns Normal: Turns with feet close FAST (< 3 steps) with good balance.    Step over obstacles Normal: Able to step over box with minimal change of gait speed and with good balance.    Timed UP & GO with Dual Task Normal: No noticeable change in sitting, standing or walking while backward counting when compared to TUG without    Mini-BEST total score 26                         [facility] Adult PT Treatment/Exercise - 02/04/20 1415      Therapeutic Activites    Therapeutic Activities Other Therapeutic Activities    Other Therapeutic Activities discussed with pt and pt's spouse progress with  PT and anticipate D/C after next 1-2 sessions and pt reporting incr compliance with HEP and is doing a community fitness program. discussed that after D/C will have pt return for a 15 min PD screen for all 3 disciplines to determine if an eval is necessary, both in agreement           Access Code: [email or web address] URL: [email or web address] Date: 02/04/2020 Prepared by: Janann August  Reviewed and added new additions to pt's HEP:   Exercises Supine Chin Tuck - 1-2 x daily - 5 x weekly - 2 sets - 10 reps Seated Cervical Retraction - 1 x daily - 5 x weekly - 2 sets - 10 reps - to perform throughout the workday - sitting in a chair next to the wall and thinking about sitting as "tall as the wall" and performing nice and gently, initially attempted performing seated on mat table without back support, but pt reporting incr strain, pt reporting feeling better when performing against the wall.  Romberg Stance Eyes Closed on Foam Pad - 1 x daily - 5 x weekly - 2 sets - 5 reps - to perform on foam pad 2 x 5 reps head turns and 2 x 5reps head nods  Single Leg Stance with Support - 2 x daily - 5 x weekly - 3 sets - 10 hold - at countertop with intermittent UE support and cues to squeeze glutes on LLE for incr stability.    Educated pt and pt's wife on where to purchase a foam pad for home to perform exercises.     Balance Exercises - 02/04/20 1456      Balance Exercises: Standing   Standing Eyes Closed Narrow base of support (BOS);Foam/compliant surface    Standing Eyes Closed Limitations 2 x 30 seconds static standing    Other Standing Exercises eyes open alternating marching on blue air ex x10 reps B             PT Education - 02/04/20 1454    Education Details progress towards goals, additions to HEP, see TA.    Person(s) Educated Patient;Spouse    Methods Explanation;Demonstration;Handout    Comprehension Verbalized understanding;Returned demonstration            PT Short  Term Goals - 01/14/20 1324      PT SHORT TERM GOAL #1   Title Pt will be independent with initial HEP in order to build  upon functional gains made in therapy. ALL STGS DUE 01/16/2020 (corrected date from eval)    Time 4    Period Weeks    Status Achieved      PT SHORT TERM GOAL #2   Title Pt will undergo 6MWT with LTG to be written as appropriate.    Baseline 1345 ft 12/24/2019    Time 4    Period Weeks    Status Achieved      PT SHORT TERM GOAL #3   Title Pt will recover posterior balance in push and release test in 1 step independently, for improved balance recovery    Baseline 2 steps independently for balance recoverry, 1 step in forward direction.    Time 4    Period Weeks    Status Not Met      PT SHORT TERM GOAL #4   Title Pt will decr 5x sit <> stand time to 12.5 seconds or less in order to demo improved functional BLE strength and transfer efficiency.    Baseline 15.09 seconds with no UE support, 10.88 seconds without BUE support    Time 4    Period Weeks    Status Achieved             PT Long Term Goals - 02/04/20 1424      PT LONG TERM GOAL #1   Title Pt will be independent with final HEP. ALL LTGS DUE 02/15/20    Time 8    Period Weeks    Status New      PT LONG TERM GOAL #2   Title Pt will verbalize understanding of local Parkinson's disease resources, including options for continue community fitness.    Time 8    Period Weeks    Status New      PT LONG TERM GOAL #3   Title Pt will improve miniBEST to at least a 25/28 to demo improved balance and decr fall risk.    Baseline 22/28, 26/28 on 02/04/20    Time 8    Period Weeks    Status Achieved      PT LONG TERM GOAL #4   Title 6MWT to improve to at least 1450 ft to demo improved walking endurance and gait efficiency for pt's job.    Baseline 1345 ft 12/24/2019    Time 8    Period Weeks    Status Revised      PT LONG TERM GOAL #5   Title Pt will perform 12 steps with step through pattern and  single handrail vs. no handrail with mod I for improved stair negotiation in the house.    Time 8    Period Weeks    Status New                 Plan - 02/04/20 1502    Clinical Impression Statement Began to assess pt's LTGs with pt scoring a 26/28 on the miniBEST (previously a 22/28). Pt with improvements in posterior and lateral stepping strategies, gait with head turns, and coming up on heels. Added balance exercises to HEP to include SLS and eyes closed balance on compliant surfaces. Anticipate wrapping up with PT in 1-2 visits due to pt's progress and pt and pt's spouse in agreement.    Personal Factors and Comorbidities Comorbidity 3+    Comorbidities PD, hx of neck pain, chronic tinnitus, anxiety.    Examination-Activity Limitations Stand;Locomotion Level;Stairs    Examination-Participation Restrictions Community Activity;Occupation    Stability/Clinical Decision  Making Stable/Uncomplicated    Rehab Potential Excellent    PT Frequency 2x / week    PT Duration 8 weeks    PT Treatment/Interventions ADLs/Self Care Home Management;Stair training;Gait training;Functional mobility training;Therapeutic activities;Balance training;Therapeutic exercise;Neuromuscular re-education;Patient/family education;Energy conservation    PT Next Visit Plan how were new additions to HEP? review HEP and check remainder of LTGs. SLS, balance eyes closed. get scheduled for screen    PT Home Exercise Plan standing PWR, supine PWR, and [email or web address]    Consulted and Agree with Plan of Care Patient           Patient will benefit from skilled therapeutic intervention in order to improve the following deficits and impairments:  Abnormal gait,Decreased balance,Decreased activity tolerance,Difficulty walking,Decreased strength,Decreased coordination  Visit Diagnosis: Other symptoms and signs involving the nervous system  Other symptoms and signs involving the musculoskeletal system  Other lack of  coordination  Abnormal posture  Unsteadiness on feet     Problem List Patient Active Problem List   Diagnosis Date Noted  . Low testosterone 06/10/2019  . Neck pain 04/19/2017  . Anxiety state 04/28/2015  . Wellness examination 03/24/2015  . Acute pharyngitis 03/30/2014  . Intercostal muscle tear 06/20/2013  . Nonspecific abnormal electrocardiogram (ECG) (EKG) 03/08/2011  . GERD 11/14/2007  . ANXIETY STATE NOS 12/11/2006  . Hyperglycemia 09/06/2006    Arliss Journey, PT, DPT  02/04/2020, 3:05 PM  [facility] [address] [place], [place], [address] Phone: [phone]   Fax:  [phone]  Name: Mark [name] MRN: [id number] Date of Birth: 1960-11-10

## 2020-02-04 NOTE — Patient Instructions (Signed)
Access Code: [email or web address] URL: [email or web address] Date: 02/04/2020 Prepared by: Sherlie Ban  Exercises Supine Chin Tuck - 1-2 x daily - 5 x weekly - 2 sets - 10 reps Seated Cervical Retraction - 1 x daily - 5 x weekly - 2 sets - 10 reps Romberg Stance Eyes Closed on Foam Pad - 1 x daily - 5 x weekly - 2 sets - 5 reps Single Leg Stance with Support - 2 x daily - 5 x weekly - 3 sets - 10 hold

## 2020-02-04 NOTE — Therapy (Signed)
[facility] [address] [place], [place], [address] Phone: [phone]   Fax:  [phone]  Speech Language Pathology Treatment  Patient Details  Name: Mark [name] MRN: [id number] Date of Birth: 04-May-1960 Referring Provider (SLP): Huston Foley, MD   Encounter Date: 02/04/2020   End of Session - 02/04/20 1446    Visit Number 4    Number of Visits 17    Date for SLP Re-Evaluation 03/25/20    SLP Start Time 1318    SLP Stop Time  1400    SLP Time Calculation (min) 42 min    Activity Tolerance Patient tolerated treatment well           Past Medical History:  Diagnosis Date  . Family history of prostate cancer     DRE WNL; PSA never > 0.38  . Fasting hyperglycemia     PMH of  . GERD (gastroesophageal reflux disease)   . Tinnitus    w/o hearing loss    Past Surgical History:  Procedure Laterality Date  . COLONOSCOPY  05/2013   Neg, Dr Marina Goodell  . KNEE SURGERY Right   . TONSILLECTOMY AND ADENOIDECTOMY    . VASECTOMY      There were no vitals filed for this visit.   Subjective Assessment - 02/04/20 1342    Subjective "I don't think it's gotten any worse."    Patient is accompained by: Family member   amy   Currently in Pain? No/denies                 ADULT SLP TREATMENT - 02/04/20 1350      General Information   Behavior/Cognition Alert;Cooperative;Pleasant mood      Treatment Provided   Treatment provided Cognitive-Linquistic      Cognitive-Linquistic Treatment   Treatment focused on Dysarthria;Patient/family/caregiver education    Skilled Treatment SLP set up pt's MBSS during session today - 02-12-20 at 1130. With loud /a/ pt req'd cues for lower pitch - SLP told ptthat if he notices pitch is higher than he would talk stop an dstart over with lower pitch. Pt with better productions after this, average upper 80s dB. Pt produced sentences with picture description with WNL volume and with  90% WNL speech rate, notably slowing rate at least 75% of the time. SLP educated pt about abdominal breathing (AB) and pt demonstrated simultaneous chest and AB. Told pt to use supine and a cup to monitor abdominal movement 15 minutes/day.      Assessment / Recommendations / Plan   Plan Continue with current plan of care      Progression Toward Goals   Progression toward goals Progressing toward goals            SLP Education - 02/04/20 1446    Education Details abdomoinal breathing, loud /a/ pitch    Person(s) Educated Patient;Spouse    Methods Explanation;Demonstration;Verbal cues;Handout    Comprehension Verbalized understanding;Returned demonstration;Verbal cues required;Need further instruction            SLP Short Term Goals - 02/04/20 1622      SLP SHORT TERM GOAL #1   Title pt will maintain WNL volume for 5 minutes simple conversation x2 sessions    Time 2    Period Weeks    Status Revised      SLP SHORT TERM GOAL #2   Title pt will produce "ah" with at least 90dB average for 2 sessions    Time 2  Period Weeks    Status Revised      SLP SHORT TERM GOAL #3   Title pt will undergo objective swallow eval    Time 2    Period Weeks    Status On-going      SLP SHORT TERM GOAL #4   Title pt will demo articulatory HEP for speech compensations (slowed rate, overarticulation, etc) in 3 sessions    Time 2    Period Weeks    Status On-going      SLP SHORT TERM GOAL #5   Title pt will demo abdominal breathing in sentence responses in 2 sessions    Time 2    Period Weeks    Status Revised            SLP Long Term Goals - 02/04/20 1624      SLP LONG TERM GOAL #1   Title pt will engage in 15 minutes mod complex conversation with average in low 70s dB in 3 sessions    Time 6    Period Weeks   or 17 sessions, for all LTGS   Status On-going      SLP LONG TERM GOAL #2   Title pt will produce loud "ah" with average low 90s dB in 6 sessions    Time 6    Period  Weeks    Status On-going      SLP LONG TERM GOAL #3   Title pt will demo <3 rushes of speech which detract listener from pt's message in 15 minutes mod complex conversation in 3 sessions    Time 6    Period Weeks    Status On-going      SLP LONG TERM GOAL #4   Title pt will demo abdominal breathing 75% in 15 minutes mod complex conversation in 3 sessions    Time 6    Period Weeks    Status On-going            Plan - 02/04/20 1446    Clinical Impression Statement "Mark [name]" presents today with 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 Short rushes of speech occasionally noted which ceased when pt focusing on slower rate today. Objective swallow eval during this therapy course will be scheduled 02-12-20.  Mark [name] tells SLP that his decr'd volume and rushes of speech have  negatively affected his communcation at work, and he has noticed his family telling him more often that they cannot understand him. SLP believes pt will benefit from skilled ST targeting incr'd volume and greater usage of abdomoinal breathing in order to improve communicative effectiveness.pt will produce loud /a/ or "hey!" with at least low 90s dB average over three sessions    Speech Therapy Frequency 2x / week    Duration 8 weeks   17 visits   Treatment/Interventions Aspiration precaution training;Pharyngeal strengthening exercises;Diet toleration management by SLP;Trials of upgraded texture/liquids;Cueing hierarchy;Internal/external aids;Patient/family education;Compensatory strategies;SLP instruction and feedback;Functional tasks;Environmental controls           Patient will benefit from skilled therapeutic intervention in order to improve the following deficits and impairments:   Dysarthria and anarthria  Dysphagia, oropharyngeal phase    Problem List Patient Active Problem List   Diagnosis Date Noted  . Low testosterone 06/10/2019  . Neck pain 04/19/2017  . Anxiety state 04/28/2015  .  Wellness examination 03/24/2015  . Acute pharyngitis 03/30/2014  . Intercostal muscle tear 06/20/2013  . Nonspecific abnormal electrocardiogram (ECG) (EKG) 03/08/2011  . GERD 11/14/2007  .  ANXIETY STATE NOS 12/11/2006  . Hyperglycemia 09/06/2006    [facility] 02/04/2020, 4:25 PM  [facility] [address] [place], [place], [address] Phone: [phone]   Fax:  [phone]   Name: Mark [name] MRN: [id number] Date of Birth: 04/20/1960

## 2020-02-05 NOTE — Therapy (Signed)
[facility] [address] [place], [place], [address] Phone: [phone]   Fax:  [phone]  Occupational Therapy Treatment  Patient Details  Name: Mark [name] MRN: [id number] Date of Birth: [birth year] Referring Provider (OT): Dr. Frances Furbish    Encounter Date: 02/04/2020   OT End of Session - 02/05/20 1547    Visit Number 5    Number of Visits 17    Date for OT Re-Evaluation 02/13/20    Authorization Type U[employer]    Authorization Time Period [employer] 23 visit limit OT    Authorization - Visit Number 5    Authorization - Number of Visits 23    OT Start Time 1451    OT Stop Time 1530    OT Time Calculation (min) 39 min    Activity Tolerance Patient tolerated treatment well    Behavior During Therapy WFL for tasks assessed/performed           Past Medical History:  Diagnosis Date  . Family history of prostate cancer     DRE WNL; PSA never > 0.38  . Fasting hyperglycemia     PMH of  . GERD (gastroesophageal reflux disease)   . Tinnitus    w/o hearing loss    Past Surgical History:  Procedure Laterality Date  . COLONOSCOPY  05/2013   Neg, Dr Marina Goodell  . KNEE SURGERY Right   . TONSILLECTOMY AND ADENOIDECTOMY    . VASECTOMY      There were no vitals filed for this visit.   Subjective Assessment - 02/05/20 1549    Subjective  Denies pain    Pertinent History Pt is a 59 y.o male with recent diagnosis of Parkinson's disease, PMH significant for : neck pain, reflux disease, chronic tinnitus, anxiety, and allergic rhinitis, hx of COVID in July    Patient Stated Goals improve use of left arm/ hand    Currently in Pain? No/denies                    Therapist wrapped oval splint with coban for improved finger postioning, pt reports improved comfort and positioning            OT Treatment/ Education - 02/05/20 1545    Education Details Reveiwed PWR! basic 4 in quadraped, 10 reps each, min vc. for  larger amplitude movements, min v.c for larger amplitude movements, education  handwriting strategies and handout provided regarding handwriting changes with PD, pt demonstrates improved performance with repetiion and printing using foam grip.    Person(s) Educated Patient;Spouse    Methods Explanation;Demonstration;Verbal cues;Handout    Comprehension Verbalized understanding;Returned demonstration;Verbal cues required            OT Short Term Goals - 02/04/20 1510      OT SHORT TERM GOAL #1   Title I with PD specific HEP--check STGs 02/06/20 (extended due to scheduling)    Status On-going      OT SHORT TERM GOAL #2   Title Pt will verbalize understanding of adapted strategies to maximize safety and I with ADLs/ IADLs .(ie: opening containers, brushing teeth, using screwdriver/ hammer, cutting food)    Status On-going      OT SHORT TERM GOAL #3   Title Pt will demonstrate improved fine motor coordination for ADLs as evidenced by decreasing 9 hole peg test score for LUE to 75 secs or less    Status On-going      OT SHORT TERM GOAL #4  Title Pt will  demonstrate improved LUE functional use for ADLs as evidenced by increasing box/ blocks score by 4 blocks with LUE    Status On-going      OT SHORT TERM GOAL #5   Title Pt will write a short paragraph with 100% legibility and no significant decrease in letter size    Status On-going      OT SHORT TERM GOAL #6   Status Deferred             OT Long Term Goals - 12/19/19 1434      OT LONG TERM GOAL #1   Title Pt will verbalize understanding of ways to prevent future PD related complications and PD community resources.    Time 8    Period Weeks    Status New    Target Date 02/13/20      OT LONG TERM GOAL #2   Title Pt will demonstrate increased ease with self feeding as evidneced by decreasing PPT#2 to 14 secs or less    Baseline 17.75 secs    Time 8    Period Weeks    Status New      OT LONG TERM GOAL #3   Title Pt  will demonstrate ability to retrieve a lightweight object at 125 shoulder flexion and -25 elbow extension with LUE    Baseline LUE shoulder flexion 120, elbow ext -20    Time 8    Period Weeks    Status New      OT LONG TERM GOAL #4   Title Pt will demonstrate improved ease with dressing as eveidenced by decreasing 3 button/ unbutton time to 70 secs or less    Baseline 81 secs    Time 8    Period Weeks    Status New      OT LONG TERM GOAL #5   Title Pt will demonstrate improved RUE fine motor coordination as eveidenced by decreasing 9 hole peg test score to 45 secs or less    Baseline RUE 51.53 secs, LUE 93 secs    Time 8    Period Weeks    Status New                 Plan - 02/05/20 1548    Clinical Impression Statement Pt is progressing towards goals. He repsonds well to v.c for larger amplitude movements during exercises.    Occupational performance deficits (Please refer to evaluation for details): ADL's;IADL's;Leisure;Social Participation;Play;Work    Games developer / Function / Physical Skills ADL;Endurance;UE functional use;Balance;Flexibility;FMC;ROM;Gait;Coordination;GMC;Decreased knowledge of precautions;Decreased knowledge of use of DME;IADL;Strength;Dexterity;Mobility;Tone    Rehab Potential Good    OT Frequency 2x / week    OT Duration 8 weeks    OT Treatment/Interventions Self-care/ADL training;Ultrasound;Energy conservation;Patient/family education;Balance training;Passive range of motion;Gait Training;DME and/or AE instruction;Aquatic Therapy;Fluidtherapy;Splinting;Functional Mobility Training;Moist Heat;Therapeutic exercise;Manual Therapy;Therapeutic activities;Cognitive remediation/compensation;Neuromuscular education;Cryotherapy    Plan ADL strategies, fine motor coordination    Consulted and Agree with Plan of Care Patient;Family member/caregiver           Patient will benefit from skilled therapeutic intervention in order to improve the following  deficits and impairments:   Body Structure / Function / Physical Skills: ADL,Endurance,UE functional use,Balance,Flexibility,FMC,ROM,Gait,Coordination,GMC,Decreased knowledge of precautions,Decreased knowledge of use of DME,IADL,Strength,Dexterity,Mobility,Tone       Visit Diagnosis: Other symptoms and signs involving the nervous system  Other symptoms and signs involving the musculoskeletal system  Other lack of coordination  Abnormal posture  Stiffness of left shoulder,  not elsewhere classified  Stiffness of left elbow, not elsewhere classified    Problem List Patient Active Problem List   Diagnosis Date Noted  . Low testosterone 06/10/2019  . Neck pain 04/19/2017  . Anxiety state 04/28/2015  . Wellness examination 03/24/2015  . Acute pharyngitis 03/30/2014  . Intercostal muscle tear 06/20/2013  . Nonspecific abnormal electrocardiogram (ECG) (EKG) 03/08/2011  . GERD 11/14/2007  . ANXIETY STATE NOS 12/11/2006  . Hyperglycemia 09/06/2006    Hedda Crumbley 02/05/2020, 3:49 PM  [facility] [address] [place] [address] [place], [place], [address] Phone: [phone]   Fax:  [phone]  Name: Mark [name] MRN: [id number] Date of Birth: 04-22-1960

## 2020-02-09 ENCOUNTER — Ambulatory Visit: Payer: 59 | Admitting: Occupational Therapy

## 2020-02-09 ENCOUNTER — Ambulatory Visit: Payer: 59

## 2020-02-10 ENCOUNTER — Ambulatory Visit: Payer: 59 | Admitting: Neurology

## 2020-02-10 ENCOUNTER — Encounter: Payer: Self-pay | Admitting: Neurology

## 2020-02-10 ENCOUNTER — Telehealth: Payer: Self-pay

## 2020-02-10 NOTE — Telephone Encounter (Signed)
Pt did not show for their appt with Dr. Athar today.  

## 2020-02-11 ENCOUNTER — Ambulatory Visit: Payer: 59 | Admitting: Physical Therapy

## 2020-02-11 ENCOUNTER — Ambulatory Visit: Payer: 59 | Admitting: Occupational Therapy

## 2020-02-11 ENCOUNTER — Ambulatory Visit: Payer: 59 | Admitting: Neurology

## 2020-02-11 ENCOUNTER — Ambulatory Visit: Payer: 59

## 2020-02-12 ENCOUNTER — Other Ambulatory Visit: Payer: Self-pay

## 2020-02-12 ENCOUNTER — Ambulatory Visit (HOSPITAL_COMMUNITY): Payer: 59

## 2020-02-12 ENCOUNTER — Ambulatory Visit: Payer: 59 | Admitting: Physical Therapy

## 2020-02-12 ENCOUNTER — Ambulatory Visit (HOSPITAL_COMMUNITY)
Admission: RE | Admit: 2020-02-12 | Discharge: 2020-02-12 | Disposition: A | Discharge status: Home / Self Care | Payer: 59 | Source: Ambulatory Visit | Attending: Neurology | Admitting: Neurology

## 2020-02-12 DIAGNOSIS — R1310 Dysphagia, unspecified: Secondary | ICD-10-CM

## 2020-02-12 DIAGNOSIS — G20 Parkinson's disease: Secondary | ICD-10-CM

## 2020-02-12 DIAGNOSIS — R498 Other voice and resonance disorders: Secondary | ICD-10-CM

## 2020-02-19 ENCOUNTER — Ambulatory Visit: Payer: 59 | Admitting: Neurology

## 2020-02-19 ENCOUNTER — Encounter: Payer: Self-pay | Admitting: Neurology

## 2020-02-19 VITALS — BP 122/83 | HR 85 | Ht 73.0 in | Wt 177.0 lb

## 2020-02-19 DIAGNOSIS — G20 Parkinson's disease: Secondary | ICD-10-CM | POA: Diagnosis not present

## 2020-02-19 MED ORDER — ROPINIROLE HCL 1 MG PO TABS: 2.0000 mg | ORAL_TABLET | Freq: Three times a day (TID) | ORAL | 3 refills | Status: DC

## 2020-02-19 MED ORDER — ESCITALOPRAM OXALATE 5 MG PO TABS: 5.0000 mg | ORAL_TABLET | Freq: Every day | ORAL | 3 refills | Status: DC

## 2020-02-19 NOTE — Patient Instructions (Addendum)
It was good to see you again today.  I am glad you are able to tolerate the ropinirole 1 mg pill, 1-1/2 pills 3 times daily.  As discussed, I would like for you to try to increase this very cautiously to eventually be able to take 2 pills 3 times daily which would be considered a good maintenance dose on the lower end of the spectrum.  To that end, try to take 2 pills in the morning, 1-1/2 pills for your second and third dose for the next week or 2 or even longer.  You may want to start on the weekend.  You can gradually add 1/2 pill to the other Doses.  Let me know how it goes.  Please continue with Lexapro 5 mg at bedtime.  I have renewed your prescription for 90 days with refills.  Please continue to maintain your healthy lifestyle, try to stay active mentally and physically, do not overdo your exercises, try to stay well-hydrated with water, and well rested.  Please follow-up routinely in 6 months, sooner if needed.

## 2020-02-19 NOTE — Progress Notes (Signed)
Subjective:    Patient ID: Mark [name] is a 60 y.o. male.  HPI     Interim history:   Mr. Mark [name] is a 60 year old right-handed gentleman with an underlying medical history of neck pain, reflux disease, chronic tinnitus, anxiety, and allergic rhinitis, who presents for follow-up consultation of his parkinsonism, likely left-sided predominant Parkinson's disease with akinetic-rigid presentation.  The patient is unaccompanied today, his wife had to take their son to the doctor. He missed an appointment on 02/10/20. I last saw him on 11/12/2019, at which time he reported feeling better with regards to his Covid symptoms.  We mutually agreed to taper his Neupro patch, as he did not have much in the way of benefit from it.  For anxiety, he was advised to start Lexapro generic low-dose 5 mg strength once daily.  He was advised to start ropinirole immediate release.  He emailed back with the side effects on the ropinirole particularly worsening dizziness and sleepiness.  He was advised to change from immediate release ropinirole to long-acting ropinirole once daily 6 mg strength.  Today, 02/19/20: He reports that he is doing fairly well.  He has just recently completed therapy through neuro rehab and it was very beneficial.  He reports that he missed an appointment recently because he got his dates mixed up.  He has been able to reduce the immediate release ropinirole at 1 mg strength 1-1/2 pills 3 times daily.  When he switched to the ropinirole once daily 6 mg strength he felt that he had more side effects compared to the immediate release.  He is not taking the long-acting ropinirole.  Lexapro at 5 mg strength has been helpful.  He takes it at bedtime and feels that it even helps him sleep a little better.  He has not had any recent falls, he continues to stay active.  He does have some stiffness in the left arm and occasional discomfort in the skin in the left forearm.  He tries to hydrate well.  He  exercises 3 to 4 days a week.  He has been boxing, he also takes some exercise classes.  He does not have any issues with constipation.  He feels that the ropinirole has helped, sometimes his mornings are worse than the afternoons, Monday through Wednesday he does fairly well at work but by Thursday and Friday he feels very exhausted.    The patient's allergies, current medications, family history, past medical history, past social history, past surgical history and problem list were reviewed and updated as appropriate.    Previously   I saw him on 05/22/2019, at which time he reported that his dizziness had improved after he reduced the Neupro back to 6 mg daily from 8 mg daily.  He has had intermittent issues with anxiety.  Back stiffness was fairly stable, Flexeril as needed was helpful.  His wife was worried about his extensive exercise and that he would tire himself out.  We talked about moderation when he came to work schedule and exercising.  He reported some tingling on the left side and feeling a cold sensation in the left arm and hand intermittently.  He did not have any significant constipation or sleep issues.  He and his wife were worried about his EEG.  He had tried Viagra but had side effects.  He had a prescription for Cialis but had not tried it.  I encouraged him to consider seeing a urologist and talk to his primary care provider about a  referral, if the Cialis was not helpful. He was advised to continue with the Neupro patch 6 mg once daily.      I saw him on 01/21/2019, at which time he reported feeling better with regards to his restless leg symptoms.  He was taking melatonin at night, 5 mg strength.  He was noticing more stiffness and slowness.  I suggested we increase his Neupro to 8 mg daily.  He also reported back stiffness and back pain in the mid back area, was taking ibuprofen as needed and I suggested a trial of Flexeril.  He emailed in the interim in January 2021, reporting some  bouts of constipation.  He and his wife also emailed through [employer] last month reporting that he felt dizzy and lightheaded.  Eventually, we decided to reduce his Neupro patch back to 6 mg daily.     I saw him on 10/01/2018, at which time he reported feeling better after starting his medication.  He also was not using his Ativan as much.  He was working full-time, very active overall, walking a lot.  He had noticed some intermittent sensory symptoms, nothing sustained.  He was advised to continue with the Neupro patch 6 mg strength.    He emailed in the interim with increase in restless leg symptoms, he was advised to try gabapentin in September 2020.     I saw him on 07/02/2018 in virtual visit, at which time we mutually agreed to start him on Neupro patch.  We increased it in the recent past to 6 mg daily.  He emailed recently regarding some swelling he had noticed in the left upper and lower extremities.  He had an ultrasound of the left upper extremity which was negative for a blood clot.       I first met him on 04/03/2018 at the request of Dr. Barbaraann Barthel, at which time the patient reported a several month history, probably a 32-month history of left hand tremor, neck stiffness, difficulty with fine motor control with the left upper extremity, and hand stiffness.  His history and physical examination were in keeping with parkinsonism on the left side.  We talked about symptomatic treatment options to be utilized soon, I suggested we proceed with a nuclear medicine DaTscan.  Due to the COVID-19 pandemic, his scan has not yet been done, he was approved for it though.   The patient sent a MyChart message in the interim in late March 2020 indicating that he has noticed more symptoms including some difficulty with his speech, including slurring, increased salivation, causing drooling at times and some symptoms in his right hand.     04/03/2018: (He) reports a left hand tremor for the past several months,  likely 6-9 months. In addition, he has noted difficulty with fine motor control, he has noticed neck stiffness and left hand stiffness and difficulty performing fine motor skills with the left hand. I reviewed your office note from 01/17/2018. He has had radiating neck pain to the left arm. He has had physical therapy for this. His neck pain has improved.  He fell in 2017 and had neck pain. He had a X ray neck on 03/26/17: IMPRESSION: No acute findings.   Mild spondylosis of the cervical spine with moderate to severe multilevel bilateral neural foraminal narrowing as described.   He denies a family history of Parkinsons disease or tremor. He denies any recent falls. He denies any constipation. Mood is fairly stable but he does admit to having  a longer standing history of anxiety. He takes Ativan as needed for this. He also reports claustrophobia and having had difficulty going through an MRI in the past.   He has had some issues with becoming slower in his mobility. His brother recently commented on this. He is the youngest of 4 siblings, he has an older brother, then his sister, then another brother. He lives with his family which includes his wife and 55 year old daughter and 74 year old son. He works as a Art gallery manager. He is a nonsmoker and drinks alcohol in the form of wine, maybe 3 glasses per week on average, caffeine in the form of coffee, 3 cups per day on average. He tries to hydrate well. He exercises regularly. His Past Medical History Is Significant For: Past Medical History:  Diagnosis Date   Family history of prostate cancer     DRE WNL; PSA never > 0.38   Fasting hyperglycemia     PMH of   GERD (gastroesophageal reflux disease)    Parkinson's disease (HCC)    Tinnitus    w/o hearing loss    His Past Surgical History Is Significant For: Past Surgical History:  Procedure Laterality Date   COLONOSCOPY  05/2013   Neg, Dr Henrene Pastor   KNEE SURGERY Right    TONSILLECTOMY  AND ADENOIDECTOMY     VASECTOMY      His Family History Is Significant For: Family History  Problem Relation Age of Onset   COPD Mother        smoker   Prostate cancer Father 8       no longer with prostate cancer   Breast cancer Sister    COPD Paternal Aunt        smoker   Stroke Maternal Grandfather         late 2s   Heart disease Neg Hx    Colon cancer Neg Hx    Esophageal cancer Neg Hx    Pancreatic cancer Neg Hx    Rectal cancer Neg Hx    Stomach cancer Neg Hx    Diabetes Neg Hx    Other Neg Hx        low testosterone    His Social History Is Significant For: Social History   Socioeconomic History   Marital status: Married    Spouse name: Not on file   Number of children: Not on file   Years of education: Not on file   Highest education level: Not on file  Occupational History   Not on file  Tobacco Use   Smoking status: Never Smoker   Smokeless tobacco: Never Used  Substance and Sexual Activity   Alcohol use: Yes    Comment: very rare wine , < one per month per pt.   Drug use: No   Sexual activity: Yes  Other Topics Concern   Not on file  Social History Narrative   Not on file   Social Determinants of Health   Financial Resource Strain: Not on file  Food Insecurity: Not on file  Transportation Needs: Not on file  Physical Activity: Not on file  Stress: Not on file  Social Connections: Not on file    His Allergies Are:  Allergies  Allergen Reactions   Penicillins     ? Reaction @ age 87  :   His Current Medications Are:  Outpatient Encounter Medications as of 02/19/2020  Medication Sig   cyclobenzaprine (FLEXERIL) 10 MG tablet TAKE ONE TABLET BY MOUTH EVERY NIGHT  AT BEDTIME AS NEEDED FOR MUSCLE SPASMS   escitalopram (LEXAPRO) 5 MG tablet Take 1 tablet (5 mg total) by mouth at bedtime.   LORazepam (ATIVAN) 0.5 MG tablet 1 tab po every 8 hours as needed anxiety   Multiple Vitamins-Minerals (MENS MULTIVITAMIN  PLUS) TABS Take by mouth daily.   Ropinirole HCl 6 MG TB24 Take 1 tablet (6 mg total) by mouth at bedtime.   clomiPHENE (CLOMID) 50 MG tablet 1/4 tab daily (Patient not taking: No sig reported)   ferrous sulfate 324 (65 Fe) MG TBEC 1 tab po bid (Patient not taking: Reported on 02/19/2020)   rotigotine (NEUPRO) 2 MG/24HR Place 1 patch onto the skin daily. Tapering dose, part of multiple Rx (Patient not taking: No sig reported)   rotigotine (NEUPRO) 4 MG/24HR Place 1 patch onto the skin daily. Tapering off, therefore multiple Rx (Patient not taking: No sig reported)   rotigotine (NEUPRO) 6 MG/24HR Place 1 patch onto the skin daily. (Patient not taking: No sig reported)   tadalafil (CIALIS) 5 MG tablet Take 1 tablet (5 mg total) by mouth daily. (Patient not taking: No sig reported)   No facility-administered encounter medications on file as of 02/19/2020.  :  Review of Systems:  Out of a complete 14 point review of systems, all are reviewed and negative with the exception of these symptoms as listed below:  Review of Systems  Neurological:       Rm 2, 3 month FU for parkinson's disease, taking requip 1.5 tabs three x daily and is working well    Objective:  Neurological Exam  Physical Exam Physical Examination:   Vitals:   02/19/20 1342  BP: 122/83  Pulse: 85    General Examination: The patient is a very pleasant 60 y.o. male in no acute distress. He appears well-developed and well-nourished and well groomed.   HEENT:Normocephalic, atraumatic, pupils are equal, round and reactive to light and accommodation. He hasmoderatefacial masking and decreased eye blink rate. Hearing is grossly intact. Face is otherwise symmetric, no dysarthria, mildhypophonia. He has moderate nuchal rigidity. No carotid bruits. Airway examination reveals no significant mouth dryness, otherwise tongue protrudes centrally and palate elevates symmetrically, and no lip, neck or jaw tremor.No  sialorrhea.  Chest:Clear to auscultation without wheezing, rhonchi or crackles noted.  Heart:S1+S2+0, regular and normal without murmurs, rubs or gallops noted.   Abdomen:Soft, non-tender and non-distended with normal bowel sounds appreciated on auscultation.  Extremities:There isnopitting edema in the distal lower extremities bilaterally.   Skin: Warm and dry without trophic changes noted.  Musculoskeletal: exam reveals no obvious joint deformities, tenderness or joint swelling or erythema.   Neurologically:  Mental status: The patient is awake, alert and oriented in all 4 spheres.Hisimmediate and remote memory, attention, language skills and fund of knowledge are appropriate. There is no evidence of aphasia, agnosia, apraxia or anomia. Speech is clear with normal prosody and enunciation. Thought process is linear. Mood is normaland affect is normal.  (On2/19/2020: on Archimedes spiral drawing he has no significant trembling with either hand, handwriting with the right hand is legible, not tremulous, slightly micrographic.)  He has no obvious resting tremor, no significant postural or action tremor. Cranial nerves II - XII are as described above under HEENT exam.At baseline, mildly elevated right shoulder noted, unchanged. Motor exam: Normal bulk,andstrength, tone is increased in both upper extremities, more noticeable on the left compared to the right.  He has no obvious resting tremor, slight postural tremor, fine motor skills are moderately  impaired on the left side, mildly impaired on the right.  Cerebellar testing: No dysmetria or intention tremor. There is no truncal or gait ataxia.  Sensory exam: intact to light touch in the upper and lower extremities.  Gait, station and balance:Hestands easily. No veering to one side is noted. No leaning to one side is noted. Posture ismildly stooped, seems a little better.Stance is narrow based. Gait shows fairly good  pace and stride length but near-absence of arm swing on the left, decreased arm swing on the right. Balance appears preserved.  Assessmentand plan:   In summary,Nico Garneris a very pleasant 38 year oldmalewith an underlying medical history of neck pain, reflux disease, chronic tinnitus, anxiety, and allergic rhinitis, whopresents forFU consultation of his L sided Parkinson's disease. HisDaT scanfrom 08/07/18 showed "Near absent activity in the putamen and asymmetric decreased activity in the head of the RIGHT caudate nucleus is a patternsuggestive of Parkinson's syndrome pathology." He was on Neupro, which restarted in May 2020, we increased this to 8 mg strength once daily in December 2020 but he started having lightheadedness and more dizziness and last month we reduced it back to 6 mg once daily. He has had more fatigue and exhaustion, particularly in the early afternoon hours. He works full-time and has to walk and stand a lot at work. He also exercises on a regular basis.  He had Covid infection in July 2021. He was partially vaccinated in April 2021.  He increased his Neupro back to 8 mg once daily we eventually decided to.  Stop this medication.  He was advised to switch to ropinirole immediate release with gradual tapering of the Neupro.  He reported dizziness and sleepiness after starting ropinirole and I changed his immediate release Requip to long-acting Requip 6 mg strength.  He felt that he did not tolerate the long-acting Requip and decided to restart the immediate release ropinirole which has been helpful and he has been able to tolerate this at 1 mg strength, 1-1/2 pills 3 times daily.   He has had physical therapy, speech therapy and Occupational Therapy through neuro rehab and just finished therapy, he has benefited from it.  He is very pleased with his therapy outcome.  He is advised to cautiously increase the ropinirole to 2 mg in the morning and continue with the 1-1/2  pills for the other 2 doses for now, and he can gradually increase if possible to 2 mg 3 times daily eventually.  I adjusted his prescription.  He is taking Lexapro 5 mg once daily.  Restart this about 3 months ago.  He has benefited from it and is encouraged to continue with the current dose of 5 mg at bedtime.  He is advised to follow-up routinely in 6 months, sooner if needed.  I answered all his questions today and he was in agreement.   I spent 30 minutes in total face-to-face time and in reviewing records during pre-charting, more than 50% of which was spent in counseling and coordination of care, reviewing test results, reviewing medications and treatment regimen and/or in discussing or reviewing the diagnosis of PD, the prognosis and treatment options. Pertinent laboratory and imaging test results that were available during this visit with the patient were reviewed by me and considered in my medical decision making (see chart for details).

## 2020-03-01 ENCOUNTER — Ambulatory Visit: Payer: 59 | Admitting: Neurology

## 2020-03-23 ENCOUNTER — Encounter: Payer: Self-pay | Admitting: Medical

## 2020-03-26 ENCOUNTER — Other Ambulatory Visit: Payer: Self-pay | Admitting: Medical

## 2020-03-26 ENCOUNTER — Other Ambulatory Visit: Payer: Self-pay

## 2020-03-26 ENCOUNTER — Ambulatory Visit: Payer: 59 | Admitting: Medical

## 2020-03-26 VITALS — BP 123/75 | HR 100 | Resp 10 | Ht 73.0 in | Wt 178.0 lb

## 2020-03-26 DIAGNOSIS — G2581 Restless legs syndrome: Secondary | ICD-10-CM | POA: Diagnosis not present

## 2020-03-26 DIAGNOSIS — N529 Male erectile dysfunction, unspecified: Secondary | ICD-10-CM | POA: Diagnosis not present

## 2020-03-26 DIAGNOSIS — F419 Anxiety disorder, unspecified: Secondary | ICD-10-CM

## 2020-03-26 DIAGNOSIS — G20 Parkinson's disease: Secondary | ICD-10-CM

## 2020-03-26 MED ORDER — VARDENAFIL HCL 20 MG PO TABS: 20.0000 mg | ORAL_TABLET | Freq: Every day | ORAL | 0 refills | Status: DC | PRN

## 2020-03-26 NOTE — Progress Notes (Signed)
Subjective:    Patient ID: Mark [name], male    DOB: 1960-06-25, 60 y.o.   MRN: [id number]  HPI   Pt in for follow up.  Pt update me he has been exercising. Walking more with some jogging.  Pt has Parksinsons and seeing neurologist.  Pt in for ED. He states cialis did not work. He states severe difficulty with erections. Pt was prescribed clomid and cialis by endocrinologist. Pt took clomid for a few months and then stopped. Pt states blue haze to vision when he used viagra. Lasted 10-15 minutes. This happened one time.    Anxiety- Controlled with lorazepam.  Pt states his restless leg history. Symptoms better with requip.    Review of Systems  Constitutional: Negative for chills, fatigue and fever.  HENT: Negative for congestion and drooling.   Respiratory: Negative for cough, chest tightness, shortness of breath and wheezing.   Cardiovascular: Negative for chest pain and palpitations.  Gastrointestinal: Negative for abdominal pain.  Musculoskeletal: Negative for back pain and myalgias.  Neurological: Negative for dizziness, numbness and headaches.  Hematological: Negative for adenopathy. Does not bruise/bleed easily.  Psychiatric/Behavioral: Negative for behavioral problems and dysphoric mood.       Past Medical History:  Diagnosis Date  . Family history of prostate cancer     DRE WNL; PSA never > 0.38  . Fasting hyperglycemia     PMH of  . GERD (gastroesophageal reflux disease)   . Parkinson's disease (HCC)   . Tinnitus    w/o hearing loss     Social History   Socioeconomic History  . Marital status: Married    Spouse name: Not on file  . Number of children: Not on file  . Years of education: Not on file  . Highest education level: Not on file  Occupational History  . Not on file  Tobacco Use  . Smoking status: Never Smoker  . Smokeless tobacco: Never Used  Substance and Sexual Activity  . Alcohol use: Yes    Comment: very rare wine , < one per  month per pt.  . Drug use: No  . Sexual activity: Yes  Other Topics Concern  . Not on file  Social History Narrative  . Not on file   Social Determinants of Health   Financial Resource Strain: Not on file  Food Insecurity: Not on file  Transportation Needs: Not on file  Physical Activity: Not on file  Stress: Not on file  Social Connections: Not on file  Intimate Partner Violence: Not on file    Past Surgical History:  Procedure Laterality Date  . COLONOSCOPY  05/2013   Neg, Dr Marina Goodell  . KNEE SURGERY Right   . TONSILLECTOMY AND ADENOIDECTOMY    . VASECTOMY      Family History  Problem Relation Age of Onset  . COPD Mother        smoker  . Prostate cancer Father 53       no longer with prostate cancer  . Breast cancer Sister   . COPD Paternal Aunt        smoker  . Stroke Maternal Grandfather         late 71s  . Heart disease Neg Hx   . Colon cancer Neg Hx   . Esophageal cancer Neg Hx   . Pancreatic cancer Neg Hx   . Rectal cancer Neg Hx   . Stomach cancer Neg Hx   . Diabetes Neg Hx   . Other  Neg Hx        low testosterone    Allergies  Allergen Reactions  . Penicillins     ? Reaction @ age 60    Current Outpatient Medications on File Prior to Visit  Medication Sig Dispense Refill  . cyclobenzaprine (FLEXERIL) 10 MG tablet TAKE ONE TABLET BY MOUTH EVERY NIGHT AT BEDTIME AS NEEDED FOR MUSCLE SPASMS 30 tablet 3  . escitalopram (LEXAPRO) 5 MG tablet Take 1 tablet (5 mg total) by mouth at bedtime. 90 tablet 3  . LORazepam (ATIVAN) 0.5 MG tablet 1 tab po every 8 hours as needed anxiety 90 tablet 2  . Multiple Vitamins-Minerals (MENS MULTIVITAMIN PLUS) TABS Take by mouth daily.    Marland Kitchen rOPINIRole (REQUIP) 1 MG tablet Take 2 tablets (2 mg total) by mouth 3 (three) times daily. Follow instructions provided 540 tablet 3  . clomiPHENE (CLOMID) 50 MG tablet 1/4 tab daily (Patient not taking: No sig reported) 8 tablet 11  . ferrous sulfate 324 (65 Fe) MG TBEC 1 tab po bid  (Patient not taking: No sig reported) 90 tablet 1   No current facility-administered medications on file prior to visit.    BP 123/75   Pulse 100   Resp 10   Ht 6\' 1"  (1.854 m)   Wt 178 lb (80.7 kg)   SpO2 94%   BMI 23.48 kg/m       Objective:   Physical Exam  General Mental Status- Alert. General Appearance- Not in acute distress.   Skin General: Color- Normal Color. Moisture- Normal Moisture.  Neck Carotid Arteries- Normal color. Moisture- Normal Moisture. No carotid bruits. No JVD.  Chest and Lung Exam Auscultation: Breath Sounds:-Normal.  Cardiovascular Auscultation:Rythm- Regular. Murmurs & Other Heart Sounds:Auscultation of the heart reveals- No Murmurs.  Abdomen Inspection:-Inspeection Normal. Palpation/Percussion:Note:No mass. Palpation and Percussion of the abdomen reveal- Non Tender, Non Distended + BS, no rebound or guarding.   Neurologic Cranial Nerve exam:- CN III-XII intact(No nystagmus), symmetric smile. Strength:- 5/5 equal and symmetric strength both upper and lower extremities.      Assessment & Plan:  For ED can stop cialis since not working and try levitra. Rx advisement. Do recommend you start back on clomid and call endocrinologist office to see when to follow up.  For restless leg continue requip. Well controlled.   For anxiety can continue lorazepam. Will refill when due.  For parkinsons continue to follow up with neurologist.  Follow up in 6 months or as needed

## 2020-03-26 NOTE — Patient Instructions (Addendum)
For ED can stop cialis since not working and try levitra. Rx advisement. Do recommend you start back on clomid and call endocrinologist office to see when to follow up.  For restless leg continue requip. Well controlled.   For anxiety can continue lorazepam. Will refill when due.  For parkinsons continue to follow up with neurologist.  Follow up in 6 months or as needed

## 2020-03-26 NOTE — Telephone Encounter (Signed)
Requesting: ativan Contract: 02/12/2020 UDS:10/08/2019 Last Visit:12/26/2019 Next Visit:03/26/2020 Last Refill:12/2019 Please Advise

## 2020-03-28 NOTE — Telephone Encounter (Signed)
Rx refill of ativan sent to pt pharmacy. 

## 2020-05-12 ENCOUNTER — Encounter: Payer: Self-pay | Admitting: Occupational Therapy

## 2020-05-12 NOTE — Therapy (Signed)
[facility] [address] [place], [place], [address] Phone: [phone]   Fax:  [phone]  Patient Details  Name: Mark [name] MRN: [id number] Date of Birth: 04/23/1960 Referring Provider:  No ref. provider found  Encounter Date: 05/12/2020    OCCUPATIONAL THERAPY DISCHARGE SUMMARY  Visits from Start of Care: 5  Current functional level related to goals / functional outcomes: Pt made progress towards goals, however he did not fully achieve them as he did not return to therapy to complete POC.   Remaining deficits: Bradykinesia, decreased coordination, decreased functional mobility, rigidity   Education / Equipment: Pt was educated in PD specific HEP and ADL/IADLS strategies with big movements.Pt verbalized understanding. Pt will benefit from PD screen in 6-8 months. Plan: Patient agrees to discharge.  Patient goals were partially met. Patient is being discharged due to not returning since the last visit.  ?????         OT Short Term Goals - 02/04/20 1510      OT SHORT TERM GOAL #1   Title I with PD specific HEP--check STGs 02/06/20 (extended due to scheduling)    Status met      OT SHORT TERM GOAL #2   Title Pt will verbalize understanding of adapted strategies to maximize safety and I with ADLs/ IADLs .(ie: opening containers, brushing teeth, using screwdriver/ hammer, cutting food)    Status Partially met     OT SHORT TERM GOAL #3   Title Pt will demonstrate improved fine motor coordination for ADLs as evidenced by decreasing 9 hole peg test score for LUE to 75 secs or less    Status Not tested     OT SHORT TERM GOAL #4   Title Pt will  demonstrate improved LUE functional use for ADLs as evidenced by increasing box/ blocks score by 4 blocks with LUE    Status Not tested      OT SHORT TERM GOAL #5   Title Pt will write a short paragraph with 100% legibility and no significant decrease in letter size     Status Not tested     OT SHORT TERM GOAL #6   Status Deferred           OT Long Term Goals - 12/19/19 1434      OT LONG TERM GOAL #1   Title Pt will verbalize understanding of ways to prevent future PD related complications and PD community resources.    Time 8    Period Weeks    Status Not tested   Target Date 02/13/20      OT LONG TERM GOAL #2   Title Pt will demonstrate increased ease with self feeding as evidneced by decreasing PPT#2 to 14 secs or less    Baseline 17.75 secs    Time 8    Period Weeks    Status Not tested     OT LONG TERM GOAL #3   Title Pt will demonstrate ability to retrieve a lightweight object at 125 shoulder flexion and -25 elbow extension with LUE    Baseline LUE shoulder flexion 120, elbow ext -20    Time 8    Period Weeks    Status Not tested     OT LONG TERM GOAL #4   Title Pt will demonstrate improved ease with dressing as eveidenced by decreasing 3 button/ unbutton time to 70 secs or less    Baseline 81 secs    Time 8  Period Weeks    Status Not tested     OT LONG TERM GOAL #5   Title Pt will demonstrate improved RUE fine motor coordination as eveidenced by decreasing 9 hole peg test score to 45 secs or less    Baseline RUE 51.53 secs, LUE 93 secs    Time 8    Period Weeks    Status Not tested         Chana Lindstrom 05/12/2020, 8:26 AM Theone Murdoch, OTR/L [phone] Phone: [phone] 8:31 AM 05/12/20 [facility] [address] [place], [place], [address] Phone: [phone]   Fax:  [phone]

## 2020-08-11 ENCOUNTER — Other Ambulatory Visit: Payer: Self-pay | Admitting: Medical

## 2020-08-11 NOTE — Telephone Encounter (Signed)
Requesting: ativan Contract:01/2020 UDS:12/26/2019 Last Visit:03/26/2020 Next Visit: N/A Last Refill:03/28/2020  Please Advise

## 2020-08-12 ENCOUNTER — Encounter: Payer: Self-pay | Admitting: Neurology

## 2020-08-12 ENCOUNTER — Telehealth: Payer: Self-pay | Admitting: Medical

## 2020-08-12 NOTE — Telephone Encounter (Signed)
Refilled his ativan today. He is up to date on both uds and contract. But when I do to [place] controlled web site today and in the past it never shows any controlled meds being written?  Will you call [facility] and see if they have been putting his ativan in the system? If they have been and I am just not seeing it not sure why? Ask them to run report? Let me know what they say? Thanks.

## 2020-08-12 NOTE — Telephone Encounter (Signed)
Braxton from the pharmacy said they run patient's insurance under the name " [name]" so if you put in Alcario Drought on the PMP website it should show up that they are filling his Ativan

## 2020-08-18 ENCOUNTER — Encounter: Payer: Self-pay | Admitting: Neurology

## 2020-08-18 ENCOUNTER — Ambulatory Visit (INDEPENDENT_AMBULATORY_CARE_PROVIDER_SITE_OTHER): Payer: 59 | Admitting: Neurology

## 2020-08-18 VITALS — BP 109/71 | HR 80 | Ht 72.0 in | Wt 184.6 lb

## 2020-08-18 DIAGNOSIS — F419 Anxiety disorder, unspecified: Secondary | ICD-10-CM | POA: Diagnosis not present

## 2020-08-18 DIAGNOSIS — G20 Parkinson's disease: Secondary | ICD-10-CM

## 2020-08-18 MED ORDER — CARBIDOPA-LEVODOPA 25-100 MG PO TABS: 1.0000 | ORAL_TABLET | Freq: Three times a day (TID) | ORAL | 5 refills | Status: DC

## 2020-08-18 NOTE — Patient Instructions (Addendum)
It was great to see you both again today.  As discussed, we will maintain you on ropinirole at the current dose, you can maintain the current timings of 9:30 AM, 3:30 PM and 9:30 PM daily.  I would like for you to start a second medication for Parkinson's disease called Sinemet (generic name: carbidopa-levodopa) 25/100 mg: Take half a pill twice daily (7 AM and noon) for one week, then half a pill 3 times a day (7 AM, noon, and 5 PM) for one week, then one pill 3 times a day thereafter. Please try to take the medication away from you mealtimes, that is, ideally either one hour before or 2 hours after your meal to ensure optimal absorption. The medication can interfere with the protein content of your meal and trying to the protein in your food and therefore not get fully absorbed.  Common side effects reported are: Nausea, vomiting, sedation, confusion, lightheadedness. Rare side effects include hallucinations, severe nausea or vomiting, diarrhea and significant drop in blood pressure especially when going from lying to standing or from sitting to standing.   Once you are stable on the medications, we can consider increasing your escitalopram/Lexapro to 10 mg once daily.  You can start taking it in the morning if you like, some people have some sedation from it.  Please continue to monitor your driving skills.  As discussed, we can consider a referral to a academic neurology practice and movement disorder specialist for evaluation for deep brain stimulation.  You can email me through MyChart if you would like a referral to see Dr. Althia Forts at [facility].

## 2020-08-18 NOTE — Progress Notes (Signed)
Subjective:    Patient ID: Mark [name] is a 60 y.o. male.  HPI    Interim history:   Mark [name] is a 60 year old right-handed gentleman with an underlying medical history of neck pain, reflux disease, chronic tinnitus, anxiety, and allergic rhinitis, who presents for follow-up consultation of his parkinsonism, likely left-sided predominant Parkinson's disease with akinetic-rigid presentation.  The patient is accompanied by his wife, Mark [name] again today. I last saw him on 02/19/2020, at which time he felt fairly well.  He had completed therapy through neuro rehab and had benefited from therapy.  He did not tolerate the once daily long-acting ropinirole.  He was on Lexapro 5 mg once daily with good tolerance.  He was on ropinirole 1 mg strength 1-1/2 pills 3 times a day.  He felt it was helpful.  He was complaining of fatigue and exhaustion.  He is trying to stay active physically.  He was advised to gradually increase the ropinirole to eventually take 2 pills 3 times daily.  Today, 08/18/2020: He reports feeling fairly stable, he has not had any recent falls but stiffness has become worse and slowness.  His wife noticed in particular that he is slow to response and does not always pick up his left foot very well.  Sometimes the foot catches and he has a tendency to trip but thankfully has not fallen.  His driving is limited to going back and from work.  He is somewhat sleepy from taking the first dose of ropinirole.  He typically takes it once he reaches work, around 9:30 AM, second dose around 3:30 PM which is still at work and last dose around 9:30 PM.  He exercises regularly including boxing classes for PD.  He tries to hydrate well.  Anxiety is stable.  Continues to take Lexapro low-dose 5 mg once daily and Ativan as needed, typically 1 pill or 1-1/2 pills for the day.  He has been able to reduce it from 1 pill 3 times daily.  He has not had any significant constipation, no significant cognitive issues but  wife has noted slow verbal reaction time and also slow motor reaction time at times.   The patient's allergies, current medications, family history, past medical history, past social history, past surgical history and problem list were reviewed and updated as appropriate.    Previously    He missed an appointment on 02/10/20. I saw him on 11/12/2019, at which time he reported feeling better with regards to his Covid symptoms.  We mutually agreed to taper his Neupro patch, as he did not have much in the way of benefit from it.  For anxiety, he was advised to start Lexapro generic low-dose 5 mg strength once daily.  He was advised to start ropinirole immediate release.  He emailed back with the side effects on the ropinirole particularly worsening dizziness and sleepiness.  He was advised to change from immediate release ropinirole to long-acting ropinirole once daily 6 mg strength.    I saw him on 05/22/2019, at which time he reported that his dizziness had improved after he reduced the Neupro back to 6 mg daily from 8 mg daily.  He has had intermittent issues with anxiety.  Back stiffness was fairly stable, Flexeril as needed was helpful.  His wife was worried about his extensive exercise and that he would tire himself out.  We talked about moderation when he came to work schedule and exercising.  He reported some tingling on the left side and  feeling a cold sensation in the left arm and hand intermittently.  He did not have any significant constipation or sleep issues.  He and his wife were worried about his EEG.  He had tried Viagra but had side effects.  He had a prescription for Cialis but had not tried it.  I encouraged him to consider seeing a urologist and talk to his primary care provider about a referral, if the Cialis was not helpful. He was advised to continue with the Neupro patch 6 mg once daily.       I saw him on 01/21/2019, at which time he reported feeling better with regards to his  restless leg symptoms.  He was taking melatonin at night, 5 mg strength.  He was noticing more stiffness and slowness.  I suggested we increase his Neupro to 8 mg daily.  He also reported back stiffness and back pain in the mid back area, was taking ibuprofen as needed and I suggested a trial of Flexeril.  He emailed in the interim in January 2021, reporting some bouts of constipation.  He and his wife also emailed through [employer] last month reporting that he felt dizzy and lightheaded.  Eventually, we decided to reduce his Neupro patch back to 6 mg daily.     I saw him on 10/01/2018, at which time he reported feeling better after starting his medication.  He also was not using his Ativan as much.  He was working full-time, very active overall, walking a lot.  He had noticed some intermittent sensory symptoms, nothing sustained.  He was advised to continue with the Neupro patch 6 mg strength.   He emailed in the interim with increase in restless leg symptoms, he was advised to try gabapentin in September 2020.     I saw him on 07/02/2018 in virtual visit, at which time we mutually agreed to start him on Neupro patch.  We increased it in the recent past to 6 mg daily.  He emailed recently regarding some swelling he had noticed in the left upper and lower extremities.  He had an ultrasound of the left upper extremity which was negative for a blood clot.       I first met him on 04/03/2018 at the request of Dr. Barbaraann Barthel, at which time the patient reported a several month history, probably a 51-month history of left hand tremor, neck stiffness, difficulty with fine motor control with the left upper extremity, and hand stiffness.  His history and physical examination were in keeping with parkinsonism on the left side.  We talked about symptomatic treatment options to be utilized soon, I suggested we proceed with a nuclear medicine DaTscan.  Due to the COVID-19 pandemic, his scan has not yet been done, he was  approved for it though.   The patient sent a MyChart message in the interim in late March 2020 indicating that he has noticed more symptoms including some difficulty with his speech, including slurring, increased salivation, causing drooling at times and some symptoms in his right hand.     04/03/2018: (He) reports a left hand tremor for the past several months, likely 6-9 months. In addition, he has noted difficulty with fine motor control, he has noticed neck stiffness and left hand stiffness and difficulty performing fine motor skills with the left hand. I reviewed your office note from 01/17/2018. He has had radiating neck pain to the left arm. He has had physical therapy for this. His neck pain has improved.  He fell in 2017 and had neck pain. He had a X ray neck on 03/26/17: IMPRESSION: No acute findings.   Mild spondylosis of the cervical spine with moderate to severe multilevel bilateral neural foraminal narrowing as described.   He denies a family history of Parkinson's disease or tremor. He denies any recent falls. He denies any constipation. Mood is fairly stable but he does admit to having a longer standing history of anxiety. He takes Ativan as needed for this. He also reports claustrophobia and having had difficulty going through an MRI in the past.   He has had some issues with becoming slower in his mobility. His brother recently commented on this. He is the youngest of 4 siblings, he has an older brother, then his sister, then another brother. He lives with his family which includes his wife and 54 year old daughter and 58 year old son. He works as a Art gallery manager. He is a nonsmoker and drinks alcohol in the form of wine, maybe 3 glasses per week on average, caffeine in the form of coffee, 3 cups per day on average. He tries to hydrate well. He exercises regularly.   His Past Medical History Is Significant For: Past Medical History:  Diagnosis Date   Family history of prostate  cancer     DRE WNL; PSA never > 0.38   Fasting hyperglycemia     PMH of   GERD (gastroesophageal reflux disease)    Parkinson's disease (HCC)    Tinnitus    w/o hearing loss    His Past Surgical History Is Significant For: Past Surgical History:  Procedure Laterality Date   COLONOSCOPY  05/2013   Neg, Dr Henrene Pastor   KNEE SURGERY Right    TONSILLECTOMY AND ADENOIDECTOMY     VASECTOMY      His Family History Is Significant For: Family History  Problem Relation Age of Onset   COPD Mother        smoker   Prostate cancer Father 69       no longer with prostate cancer   Breast cancer Sister    COPD Paternal Aunt        smoker   Stroke Maternal Grandfather         late 32s   Heart disease Neg Hx    Colon cancer Neg Hx    Esophageal cancer Neg Hx    Pancreatic cancer Neg Hx    Rectal cancer Neg Hx    Stomach cancer Neg Hx    Diabetes Neg Hx    Other Neg Hx        low testosterone    His Social History Is Significant For: Social History   Socioeconomic History   Marital status: Married    Spouse name: Not on file   Number of children: Not on file   Years of education: Not on file   Highest education level: Not on file  Occupational History   Not on file  Tobacco Use   Smoking status: Never   Smokeless tobacco: Never  Substance and Sexual Activity   Alcohol use: Yes    Comment: very rare wine , < one per month per pt.   Drug use: No   Sexual activity: Yes  Other Topics Concern   Not on file  Social History Narrative   Not on file   Social Determinants of Health   Financial Resource Strain: Not on file  Food Insecurity: Not on file  Transportation Needs: Not on file  Physical  Activity: Not on file  Stress: Not on file  Social Connections: Not on file    His Allergies Are:  Allergies  Allergen Reactions   Penicillins     ? Reaction @ age 76  :   His Current Medications Are:  Outpatient Encounter Medications as of 08/18/2020  Medication Sig    cyclobenzaprine (FLEXERIL) 10 MG tablet TAKE ONE TABLET BY MOUTH EVERY NIGHT AT BEDTIME AS NEEDED FOR MUSCLE SPASMS   escitalopram (LEXAPRO) 5 MG tablet Take 1 tablet (5 mg total) by mouth at bedtime.   ferrous sulfate 324 (65 Fe) MG TBEC 1 tab po bid   LORazepam (ATIVAN) 0.5 MG tablet TAKE ONE TABLET BY MOUTH EVERY 8 HOURS AS NEEDED FOR ANXIETY   Multiple Vitamins-Minerals (MENS MULTIVITAMIN PLUS) TABS Take by mouth daily.   rOPINIRole (REQUIP) 1 MG tablet Take 2 tablets (2 mg total) by mouth 3 (three) times daily. Follow instructions provided   vardenafil (LEVITRA) 20 MG tablet Take 1 tablet (20 mg total) by mouth daily as needed for erectile dysfunction.   [DISCONTINUED] clomiPHENE (CLOMID) 50 MG tablet 1/4 tab daily (Patient not taking: No sig reported)   No facility-administered encounter medications on file as of 08/18/2020.  :  Review of Systems:  Out of a complete 14 point review of systems, all are reviewed and negative with the exception of these symptoms as listed below:   Review of Systems  Neurological:        Doing ok, is slowing down.  L hand becoming stiff, with inconsistent tremor, speech slurring more frequently,  gait shuffling noted per wife, drooling.    Objective:  Neurological Exam  Physical Exam Physical Examination:   Vitals:   08/18/20 1301  BP: 109/71  Pulse: 80    General Examination: The patient is a very pleasant 60 y.o. male in no acute distress. He appears well-developed and well-nourished and well groomed.   HEENT: Normocephalic, atraumatic, pupils are equal, round and reactive to light and accommodation. He has moderate facial masking and decreased eye blink rate. Hearing is grossly intact. Face is otherwise symmetric, no dysarthria, mild hypophonia. He has moderate nuchal rigidity. No carotid bruits. Airway examination reveals no significant mouth dryness, otherwise tongue protrudes centrally and palate elevates symmetrically, and no lip, neck or jaw  tremor. No sialorrhea.   Chest: Clear to auscultation without wheezing, rhonchi or crackles noted.   Heart: S1+S2+0, regular and normal without murmurs, rubs or gallops noted.   Abdomen: Soft, non-tender and non-distended with normal bowel sounds appreciated on auscultation.   Extremities: There is no pitting edema in the distal lower extremities bilaterally.   Skin: Warm and dry without trophic changes noted.   Musculoskeletal: exam reveals increase in stiffness in the left upper extremity with trigger finger left ring finger.     Neurologically: Mental status: The patient is awake, alert and oriented in all 4 spheres. His immediate and remote memory, attention, language skills and fund of knowledge are appropriate. There is evidence of bradyphrenia.    (On 04/03/2018: on Archimedes spiral drawing he has no significant trembling with either hand, handwriting with the right hand is legible, not tremulous, slightly micrographic.)   He has no obvious resting tremor, no significant postural or action tremor. Cranial nerves II - XII are as described above under HEENT exam. At baseline, mildly elevated right shoulder noted, unchanged.  Motor exam: Normal bulk, and strength, tone is increased in both upper extremities, more noticeable on the left upper  extremity compared to the right.  He has no obvious resting tremor, slight postural tremor, fine motor skills are moderately impaired on the left side, mildly impaired on the right. Cerebellar testing: No dysmetria or intention tremor. There is no truncal or gait ataxia. Sensory exam: intact to light touch in the upper and lower extremities. Gait, station and balance: He stands without difficulty, posture is mildly stooped for age, seems a little worse compared to last time. Gait shows fairly good pace and stride length but near-absence of arm swing on the left, decreased arm swing on the right. Balance appears preserved.   Assessment and plan:     In summary, Mark [name] is a very pleasant 60 year old male with an underlying medical history of neck pain, reflux disease, chronic tinnitus, anxiety, and allergic rhinitis, who presents for FU consultation of his L sided Parkinson's disease. His DaT scan from 08/07/18 showed "Near absent activity in the putamen and asymmetric decreased activity in the head of the RIGHT caudate nucleus is a pattern suggestive of Parkinson's syndrome pathology." He was on Neupro, which we started in May 2020, we increased this to 8 mg strength once daily in December 2020 but he started having lightheadedness and more dizziness and last month we reduced it back to 6 mg once daily. He has had more fatigue and exhaustion, particularly in the early afternoon hours.  He works full-time and has to walk and stand a lot at work.  He also exercises on a regular basis.  He had Covid infection in July 2021. He was partially vaccinated in April 2021.  He increased his Neupro back to 8 mg once daily we eventually decided to stop this medication.  He was advised to switch to ropinirole immediate release with gradual tapering of the Neupro.  He reported dizziness and sleepiness after starting ropinirole and I changed his immediate release Requip to long-acting Requip 6 mg strength.  He felt that he did not tolerate the long-acting Requip and decided to restart the immediate release ropinirole.  We increased this gradually to 2 mg 3 times daily after our visit in January 2022.  He has been able to tolerate this but does have some sleepiness particularly first thing in the morning.  He takes the medication approximately at 9:30 AM when he is already at work, second dose around 3:30 PM and last dose around 9:30 PM.  He had physical therapy, speech therapy and Occupational Therapy through neuro rehab and benefited from it.  He continues to stay very active.  We had an extended discussion about treatment options today.  I suggested we add Sinemet  at this juncture.  He is advised to start with 25-100 mg strength half a pill twice daily.  I suggested he gradually increase this to 1 pill 3 times daily, taken at 7 AM, 12, and 5 PM daily.  If possible, he is going to continue with the ropinirole at the current dose timings.  We may consolidate the timings of the medication in the near future.  We also talked about DBS today.  He is encouraged to think about a consultation with a movement disorder specialist and surgeon for DBS evaluation.  We could send a referral to [facility]  He would prefer to think about it but is encouraged to let me know if he would like to seek a consultation at least.  We also talked about his anxiety.  For now, he can maintain with low-dose Lexapro.  He has  been able to cut down on the Ativan.  He is advised that in the near future we can increase the Lexapro to 10 mg daily.  He is encouraged to email [place] with this once he is stable on the Sinemet.  I provided a new prescription as well as detailed written instructions today.  He is advised to follow-up in about 4 months, and keep [place] posted via email through [place].  I answered all the questions today and the patient and his wife are in agreement. I spent 40 minutes in total face-to-face time and in reviewing records during pre-charting, more than 50% of which was spent in counseling and coordination of care, reviewing test results, reviewing medications and treatment regimen and/or in discussing or reviewing the diagnosis of PD, the prognosis and treatment options. Pertinent laboratory and imaging test results that were available during this visit with the patient were reviewed by me and considered in my medical decision making (see chart for details).

## 2020-08-20 ENCOUNTER — Encounter: Payer: Self-pay | Admitting: Neurology

## 2020-08-24 ENCOUNTER — Telehealth: Payer: Self-pay | Admitting: Neurology

## 2020-08-24 DIAGNOSIS — G20 Parkinson's disease: Secondary | ICD-10-CM

## 2020-08-24 NOTE — Telephone Encounter (Signed)
Please see my chart message for reference.  Please advise patient that he should be able to continue with his iron supplement as per his primary care PA, he can check if he needs to continue with the iron supplementation with them.  I have placed a referral to Dr. Rubin Payor at [facility].

## 2020-08-25 ENCOUNTER — Telehealth: Payer: Self-pay

## 2020-08-25 NOTE — Telephone Encounter (Signed)
Referral for neurology sent to [facility]. P: [phone]

## 2020-09-24 ENCOUNTER — Other Ambulatory Visit: Payer: Self-pay | Admitting: Medical

## 2020-09-24 ENCOUNTER — Telehealth: Payer: Self-pay | Admitting: Medical

## 2020-09-24 NOTE — Telephone Encounter (Signed)
Pt called and lvm to return call to schedule an appointment 

## 2020-09-24 NOTE — Telephone Encounter (Signed)
Will you get pt scheduled for controlled med visit. He is overdue. I sent in rx ativan today. But won't send in September until seen.

## 2020-09-24 NOTE — Telephone Encounter (Signed)
Requesting: ativan Contract:02/12/2020 UDS:12/26/2019 Last Visit:03/2020 Next Visit:N/A Last Refill:08/12/2020  Please Advise

## 2020-09-29 NOTE — Telephone Encounter (Signed)
Pt called and lvm to return call 

## 2020-11-07 ENCOUNTER — Other Ambulatory Visit: Payer: Self-pay | Admitting: Medical

## 2020-11-08 NOTE — Telephone Encounter (Signed)
Requesting: lorazepam 0.5mg   Contract: 12/26/2019 UDS: 12/26/2019 Last Visit: 03/26/2020 Next Visit: None Last Refill: 09/24/2020 #90 and 0RF Pt sig: 1 tab q8h prn  Please Advise

## 2020-11-10 ENCOUNTER — Encounter: Payer: Self-pay | Admitting: Medical

## 2020-11-11 ENCOUNTER — Ambulatory Visit: Payer: 59 | Admitting: Medical

## 2020-11-11 ENCOUNTER — Other Ambulatory Visit: Payer: Self-pay

## 2020-11-11 ENCOUNTER — Encounter: Payer: Self-pay | Admitting: Medical

## 2020-11-11 VITALS — BP 110/75 | HR 74 | Temp 97.6°F | Ht 73.0 in | Wt 181.4 lb

## 2020-11-11 DIAGNOSIS — F419 Anxiety disorder, unspecified: Secondary | ICD-10-CM

## 2020-11-11 DIAGNOSIS — G2581 Restless legs syndrome: Secondary | ICD-10-CM

## 2020-11-11 DIAGNOSIS — Z79899 Other long term (current) drug therapy: Secondary | ICD-10-CM

## 2020-11-11 DIAGNOSIS — Z23 Encounter for immunization: Secondary | ICD-10-CM | POA: Diagnosis not present

## 2020-11-11 DIAGNOSIS — G20 Parkinson's disease: Secondary | ICD-10-CM

## 2020-11-11 MED ORDER — LORAZEPAM 0.5 MG PO TABS: ORAL_TABLET | ORAL | 3 refills | Status: DC

## 2020-11-11 NOTE — Progress Notes (Signed)
Subjective:    Patient ID: Mark [name], male    DOB: 1960-09-17, 60 y.o.   MRN: [id number]  HPI  In for follow up.   Pt update me he has been exercising. Walking more with some jogging/also some boxing/hitting punching bag.   Pt has Parksinsons and seeing neurologist.    Anxiety- Controlled with lorazepam. In the past he had used up to 3 tab a day. Recently decreased to twice a day. Pt has neurologist who is considering increasing lexapro to 10 mg with possible goal to decrease ativan use to twice a day. Discussed use ativan and will reduce number of tabs.    Pt states his restless leg history.He states restless leg symptoms a lot less. Not having to use requip.    Review of Systems  Constitutional:  Negative for chills, fatigue and fever.  HENT:  Negative for congestion and drooling.   Respiratory:  Negative for cough, chest tightness, shortness of breath and wheezing.   Cardiovascular:  Negative for chest pain and palpitations.  Gastrointestinal:  Negative for abdominal pain, constipation, nausea and vomiting.  Genitourinary:  Negative for flank pain, frequency, penile pain and scrotal swelling.  Musculoskeletal:  Negative for back pain, joint swelling and myalgias.  Skin:  Negative for rash.    Past Medical History:  Diagnosis Date   Family history of prostate cancer     DRE WNL; PSA never > 0.38   Fasting hyperglycemia     PMH of   GERD (gastroesophageal reflux disease)    Parkinson's disease (HCC)    Tinnitus    w/o hearing loss     Social History   Socioeconomic History   Marital status: Married    Spouse name: Not on file   Number of children: Not on file   Years of education: Not on file   Highest education level: Not on file  Occupational History   Not on file  Tobacco Use   Smoking status: Never   Smokeless tobacco: Never  Substance and Sexual Activity   Alcohol use: Yes    Comment: very rare wine , < one per month per pt.   Drug use: No    Sexual activity: Yes  Other Topics Concern   Not on file  Social History Narrative   Not on file   Social Determinants of Health   Financial Resource Strain: Not on file  Food Insecurity: Not on file  Transportation Needs: Not on file  Physical Activity: Not on file  Stress: Not on file  Social Connections: Not on file  Intimate Partner Violence: Not on file    Past Surgical History:  Procedure Laterality Date   COLONOSCOPY  05/2013   Neg, Dr Marina Goodell   KNEE SURGERY Right    TONSILLECTOMY AND ADENOIDECTOMY     VASECTOMY      Family History  Problem Relation Age of Onset   COPD Mother        smoker   Prostate cancer Father 12       no longer with prostate cancer   Breast cancer Sister    COPD Paternal Aunt        smoker   Stroke Maternal Grandfather         late 81s   Heart disease Neg Hx    Colon cancer Neg Hx    Esophageal cancer Neg Hx    Pancreatic cancer Neg Hx    Rectal cancer Neg Hx    Stomach cancer Neg  Hx    Diabetes Neg Hx    Other Neg Hx        low testosterone    Allergies  Allergen Reactions   Penicillins     ? Reaction @ age 53    Current Outpatient Medications on File Prior to Visit  Medication Sig Dispense Refill   carbidopa-levodopa (SINEMET IR) 25-100 MG tablet Take 1 tablet by mouth 3 (three) times daily. Follow written titration instructions provided separately. 90 tablet 5   cyclobenzaprine (FLEXERIL) 10 MG tablet TAKE ONE TABLET BY MOUTH EVERY NIGHT AT BEDTIME AS NEEDED FOR MUSCLE SPASMS 30 tablet 3   escitalopram (LEXAPRO) 5 MG tablet Take 1 tablet (5 mg total) by mouth at bedtime. 90 tablet 3   ferrous sulfate 324 (65 Fe) MG TBEC 1 tab po bid 90 tablet 1   LORazepam (ATIVAN) 0.5 MG tablet TAKE ONE TABLET BY MOUTH EVERY 8 HOURS AS NEEDED FOR ANXIETY 90 tablet 0   Multiple Vitamins-Minerals (MENS MULTIVITAMIN PLUS) TABS Take by mouth daily.     rOPINIRole (REQUIP) 1 MG tablet Take 2 tablets (2 mg total) by mouth 3 (three) times daily.  Follow instructions provided 540 tablet 3   vardenafil (LEVITRA) 20 MG tablet Take 1 tablet (20 mg total) by mouth daily as needed for erectile dysfunction. 10 tablet 0   No current facility-administered medications on file prior to visit.    BP 110/75 (BP Location: Left Arm, Patient Position: Sitting, Cuff Size: Normal)   Pulse 74   Temp 97.6 F (36.4 C) (Oral)   Ht 6\' 1"  (1.854 m)   Wt 181 lb 6.4 oz (82.3 kg)   SpO2 100%   BMI 23.93 kg/m       Objective:   Physical Exam  General Mental Status- Alert. General Appearance- Not in acute distress.   Skin General: Color- Normal Color. Moisture- Normal Moisture.  Neck Carotid Arteries- Normal color. Moisture- Normal Moisture. No carotid bruits. No JVD.  Chest and Lung Exam Auscultation: Breath Sounds:-Normal.  Cardiovascular Auscultation:Rythm- Regular. Murmurs & Other Heart Sounds:Auscultation of the heart reveals- No Murmurs.  Abdomen Inspection:-Inspeection Normal. Palpation/Percussion:Note:No mass. Palpation and Percussion of the abdomen reveal- Non Tender, Non Distended + BS, no rebound or guarding.   Neurologic Cranial Nerve exam:- CN III-XII intact(No nystagmus), symmetric smile. Strength:- 5/5 equal and symmetric strength both upper and lower extremities.       Assessment & Plan:   Patient Instructions  Anxiety well controlled requiring less ativan recently/just twice a day. Asking MA to update contact at twice a day. UDS today as well. Continue lexapro.  For restless leg if flares again then restart requip.  For parkinsons continue to follow up with neurologist.  Flu vaccine today.  Follow up within 1-2 months for wellness exam for fasting labs.    , PA-C

## 2020-11-11 NOTE — Addendum Note (Signed)
Addended by: Maximino Sarin on: 11/11/2020 10:41 AM   Modules accepted: Orders

## 2020-11-11 NOTE — Patient Instructions (Addendum)
Anxiety well controlled requiring less ativan recently/just twice a day. Asking MA to update contact at twice a day. UDS today as well. Continue lexapro.  For restless leg if flares again then restart requip.  For parkinsons continue to follow up with neurologist.  Flu vaccine today.  Follow up within 1-2 months for wellness exam for fasting labs.

## 2020-11-14 LAB — DRUG MONITORING PANEL 376104, URINE
Alphahydroxyalprazolam: NEGATIVE ng/mL (ref <25)
Alphahydroxymidazolam: NEGATIVE ng/mL (ref <50)
Alphahydroxytriazolam: NEGATIVE ng/mL (ref <50)
Aminoclonazepam: NEGATIVE ng/mL (ref <25)
Amphetamines: NEGATIVE ng/mL (ref <500)
Barbiturates: NEGATIVE ng/mL (ref <300)
Benzodiazepines: POSITIVE ng/mL — AB (ref <100)
Cocaine Metabolite: NEGATIVE ng/mL (ref <150)
Desmethyltramadol: NEGATIVE ng/mL (ref <100)
Hydroxyethylflurazepam: NEGATIVE ng/mL (ref <50)
Lorazepam: 334 ng/mL — ABNORMAL HIGH (ref <50)
Nordiazepam: NEGATIVE ng/mL (ref <50)
Opiates: NEGATIVE ng/mL (ref <100)
Oxazepam: NEGATIVE ng/mL (ref <50)
Oxycodone: NEGATIVE ng/mL (ref <100)
Temazepam: NEGATIVE ng/mL (ref <50)
Tramadol: NEGATIVE ng/mL (ref <100)

## 2020-11-14 LAB — DM TEMPLATE

## 2020-11-17 ENCOUNTER — Encounter: Payer: Self-pay | Admitting: Neurology

## 2020-11-17 MED ORDER — ESCITALOPRAM OXALATE 10 MG PO TABS: 10.0000 mg | ORAL_TABLET | Freq: Every day | ORAL | 5 refills | Status: DC

## 2020-11-17 NOTE — Telephone Encounter (Signed)
Noted, thanks!

## 2020-11-17 NOTE — Telephone Encounter (Signed)
I will increase his Lexapro to 10 mg once daily from currently 5 mg daily.

## 2020-11-22 ENCOUNTER — Encounter: Payer: 59 | Admitting: Medical

## 2020-12-06 ENCOUNTER — Other Ambulatory Visit: Payer: Self-pay

## 2020-12-06 ENCOUNTER — Ambulatory Visit (INDEPENDENT_AMBULATORY_CARE_PROVIDER_SITE_OTHER): Payer: 59 | Admitting: Medical

## 2020-12-06 VITALS — BP 120/70 | HR 73 | Temp 98.2°F | Resp 18 | Ht 73.0 in | Wt 182.0 lb

## 2020-12-06 DIAGNOSIS — Z0000 Encounter for general adult medical examination without abnormal findings: Secondary | ICD-10-CM | POA: Diagnosis not present

## 2020-12-06 DIAGNOSIS — Z125 Encounter for screening for malignant neoplasm of prostate: Secondary | ICD-10-CM

## 2020-12-06 DIAGNOSIS — Z23 Encounter for immunization: Secondary | ICD-10-CM | POA: Diagnosis not present

## 2020-12-06 LAB — CBC WITH DIFFERENTIAL/PLATELET
Basophils Absolute: 0 10*3/uL (ref 0.0–0.1)
Basophils Relative: 0.6 % (ref 0.0–3.0)
Eosinophils Absolute: 0.1 10*3/uL (ref 0.0–0.7)
Eosinophils Relative: 1.3 % (ref 0.0–5.0)
HCT: 42.4 % (ref 39.0–52.0)
Hemoglobin: 14.2 g/dL (ref 13.0–17.0)
Lymphocytes Relative: 21.2 % (ref 12.0–46.0)
Lymphs Abs: 1 10*3/uL (ref 0.7–4.0)
MCHC: 33.5 g/dL (ref 30.0–36.0)
MCV: 96 fl (ref 78.0–100.0)
Monocytes Absolute: 0.5 10*3/uL (ref 0.1–1.0)
Monocytes Relative: 11 % (ref 3.0–12.0)
Neutro Abs: 3 10*3/uL (ref 1.4–7.7)
Neutrophils Relative %: 65.9 % (ref 43.0–77.0)
Platelets: 156 10*3/uL (ref 150.0–400.0)
RBC: 4.41 Mil/uL (ref 4.22–5.81)
RDW: 12.8 % (ref 11.5–15.5)
WBC: 4.6 10*3/uL (ref 4.0–10.5)

## 2020-12-06 LAB — COMPREHENSIVE METABOLIC PANEL
ALT: 28 U/L (ref 0–53)
AST: 24 U/L (ref 0–37)
Albumin: 4.3 g/dL (ref 3.5–5.2)
Alkaline Phosphatase: 64 U/L (ref 39–117)
BUN: 16 mg/dL (ref 6–23)
CO2: 29 mEq/L (ref 19–32)
Calcium: 9.4 mg/dL (ref 8.4–10.5)
Chloride: 103 mEq/L (ref 96–112)
Creatinine, Ser: 0.99 mg/dL (ref 0.40–1.50)
GFR: 84.27 mL/min (ref >60.00)
Glucose, Bld: 94 mg/dL (ref 70–99)
Potassium: 4 mEq/L (ref 3.5–5.1)
Sodium: 141 mEq/L (ref 135–145)
Total Bilirubin: 0.8 mg/dL (ref 0.2–1.2)
Total Protein: 6.9 g/dL (ref 6.0–8.3)

## 2020-12-06 LAB — LIPID PANEL
Cholesterol: 201 mg/dL — ABNORMAL HIGH (ref 0–200)
HDL: 77.6 mg/dL (ref >39.00)
LDL Cholesterol: 115 mg/dL — ABNORMAL HIGH (ref 0–99)
NonHDL: 123.64
Total CHOL/HDL Ratio: 3
Triglycerides: 42 mg/dL (ref 0.0–149.0)
VLDL: 8.4 mg/dL (ref 0.0–40.0)

## 2020-12-06 LAB — PSA: PSA: 0.42 ng/mL (ref 0.10–4.00)

## 2020-12-06 NOTE — Progress Notes (Signed)
Subjective:    Patient ID: Mark [name], male    DOB: 04-15-1960, 60 y.o.   MRN: [id number]  HPI  I have reviewed pt PMH, PSH, FH, Social History and Surgical History     Pt wants CPE   Pt manages show room sales, Pt walking/running 3-4 times a weeks.(3-4 miles).  Some boxing and cardio. Pt does drink 2 cups of coffee a day. Married- 2 children     No current problems today.    Genella Rife- pt has occasional gerd but not frequent. Every 3 days sometimes for hour or so after meal. Takes pepcid or equivalaent.   Family history of prostate cancer. Dx at 70 years.  Pt had colonoscopy in 2015. Repeat in 10 years.  Review of Systems  Constitutional:  Negative for chills, fatigue and fever.  HENT:  Negative for congestion, ear discharge, facial swelling, hearing loss and mouth sores.   Respiratory:  Negative for choking, shortness of breath and wheezing.   Cardiovascular:  Negative for chest pain and palpitations.  Gastrointestinal:  Negative for abdominal pain and anal bleeding.  Endocrine: Negative for polydipsia, polyphagia and polyuria.  Musculoskeletal:  Negative for back pain, joint swelling, myalgias and neck stiffness.  Skin:  Negative for pallor and rash.  Neurological:  Negative for facial asymmetry, light-headedness and headaches.  Hematological:  Negative for adenopathy. Does not bruise/bleed easily.  Psychiatric/Behavioral:  Negative for behavioral problems, decreased concentration, dysphoric mood, sleep disturbance and suicidal ideas. The patient is not nervous/anxious.     Past Medical History:  Diagnosis Date   Family history of prostate cancer     DRE WNL; PSA never > 0.38   Fasting hyperglycemia     PMH of   GERD (gastroesophageal reflux disease)    Parkinson's disease (HCC)    Tinnitus    w/o hearing loss     Social History   Socioeconomic History   Marital status: Married    Spouse name: Not on file   Number of children: Not on file   Years of  education: Not on file   Highest education level: Not on file  Occupational History   Not on file  Tobacco Use   Smoking status: Never   Smokeless tobacco: Never  Substance and Sexual Activity   Alcohol use: Yes    Comment: very rare wine , < one per month per pt.   Drug use: No   Sexual activity: Yes  Other Topics Concern   Not on file  Social History Narrative   Not on file   Social Determinants of Health   Financial Resource Strain: Not on file  Food Insecurity: Not on file  Transportation Needs: Not on file  Physical Activity: Not on file  Stress: Not on file  Social Connections: Not on file  Intimate Partner Violence: Not on file    Past Surgical History:  Procedure Laterality Date   COLONOSCOPY  05/2013   Neg, Dr Marina Goodell   KNEE SURGERY Right    TONSILLECTOMY AND ADENOIDECTOMY     VASECTOMY      Family History  Problem Relation Age of Onset   COPD Mother        smoker   Prostate cancer Father 35       no longer with prostate cancer   Breast cancer Sister    COPD Paternal Aunt        smoker   Stroke Maternal Grandfather         late 55s  Heart disease Neg Hx    Colon cancer Neg Hx    Esophageal cancer Neg Hx    Pancreatic cancer Neg Hx    Rectal cancer Neg Hx    Stomach cancer Neg Hx    Diabetes Neg Hx    Other Neg Hx        low testosterone    Allergies  Allergen Reactions   Penicillins     ? Reaction @ age 82    Current Outpatient Medications on File Prior to Visit  Medication Sig Dispense Refill   carbidopa-levodopa (SINEMET IR) 25-100 MG tablet Take 1 tablet by mouth 3 (three) times daily. Follow written titration instructions provided separately. 90 tablet 5   cyclobenzaprine (FLEXERIL) 10 MG tablet TAKE ONE TABLET BY MOUTH EVERY NIGHT AT BEDTIME AS NEEDED FOR MUSCLE SPASMS 30 tablet 3   escitalopram (LEXAPRO) 10 MG tablet Take 1 tablet (10 mg total) by mouth daily. 30 tablet 5   ferrous sulfate 324 (65 Fe) MG TBEC 1 tab po bid 90 tablet 1    LORazepam (ATIVAN) 0.5 MG tablet 1 tab po bid prn anxiety 60 tablet 3   Multiple Vitamins-Minerals (MENS MULTIVITAMIN PLUS) TABS Take by mouth daily.     rOPINIRole (REQUIP) 1 MG tablet Take 2 tablets (2 mg total) by mouth 3 (three) times daily. Follow instructions provided 540 tablet 3   vardenafil (LEVITRA) 20 MG tablet Take 1 tablet (20 mg total) by mouth daily as needed for erectile dysfunction. 10 tablet 0   No current facility-administered medications on file prior to visit.    BP 120/70   Pulse 73   Temp 98.2 F (36.8 C)   Resp 18   Ht 6\' 1"  (1.854 m)   Wt 182 lb (82.6 kg)   SpO2 100%   BMI 24.01 kg/m       Objective:   Physical Exam  General Mental Status- Alert. General Appearance- Not in acute distress.   Skin General: Color- Normal Color. Moisture- Normal Moisture.  Neck Carotid Arteries- Normal color. Moisture- Normal Moisture. No carotid bruits. No JVD.  Chest and Lung Exam Auscultation: Breath Sounds:-Normal.  Cardiovascular Auscultation:Rythm- Regular. Murmurs & Other Heart Sounds:Auscultation of the heart reveals- No Murmurs.  Abdomen Inspection:-Inspeection Normal. Palpation/Percussion:Note:No mass. Palpation and Percussion of the abdomen reveal- Non Tender, Non Distended + BS, no rebound or guarding.   Neurologic Cranial Nerve exam:- CN III-XII intact(No nystagmus), symmetric smile. Strength:- 5/5 equal and symmetric strength both upper and lower extremities.       Assessment & Plan:   Patient Instructions  For you wellness exam today I have ordered cbc, cmp, lipid panel and psa.  Vaccine today tdap.   Recommend exercise and healthy diet.  We will let you know lab results as they come in.  Follow up date appointment will be determined after lab review.        , PA-C

## 2020-12-06 NOTE — Addendum Note (Signed)
Addended by: Maximino Sarin on: 12/06/2020 08:32 AM   Modules accepted: Orders

## 2020-12-06 NOTE — Patient Instructions (Addendum)
For you wellness exam today I have ordered cbc, cmp, lipid panel and psa.  Vaccine today tdap.   Recommend exercise and healthy diet.  We will let you know lab results as they come in.  Follow up date appointment will be determined after lab review.    Preventive Care 65-58 Years Old, Male Preventive care refers to lifestyle choices and visits with your health care provider that can promote health and wellness. This includes: A yearly physical exam. This is also called an annual wellness visit. Regular dental and eye exams. Immunizations. Screening for certain conditions. Healthy lifestyle choices, such as: Eating a healthy diet. Getting regular exercise. Not using drugs or products that contain nicotine and tobacco. Limiting alcohol use. What can I expect for my preventive care visit? Physical exam Your health care provider will check your: Height and weight. These may be used to calculate your BMI (body mass index). BMI is a measurement that tells if you are at a healthy weight. Heart rate and blood pressure. Body temperature. Skin for abnormal spots. Counseling Your health care provider may ask you questions about your: Past medical problems. Family's medical history. Alcohol, tobacco, and drug use. Emotional well-being. Home life and relationship well-being. Sexual activity. Diet, exercise, and sleep habits. Work and work Astronomer. Access to firearms. What immunizations do I need? Vaccines are usually given at various ages, according to a schedule. Your health care provider will recommend vaccines for you based on your age, medical history, and lifestyle or other factors, such as travel or where you work. What tests do I need? Blood tests Lipid and cholesterol levels. These may be checked every 5 years, or more often if you are over 19 years old. Hepatitis C test. Hepatitis B test. Screening Lung cancer screening. You may have this screening every year starting  at age 10 if you have a 30-pack-year history of smoking and currently smoke or have quit within the past 15 years. Prostate cancer screening. Recommendations will vary depending on your family history and other risks. Genital exam to check for testicular cancer or hernias. Colorectal cancer screening. All adults should have this screening starting at age 106 and continuing until age 43. Your health care provider may recommend screening at age 13 if you are at increased risk. You will have tests every 1-10 years, depending on your results and the type of screening test. Diabetes screening. This is done by checking your blood sugar (glucose) after you have not eaten for a while (fasting). You may have this done every 1-3 years. STD (sexually transmitted disease) testing, if you are at risk. Follow these instructions at home: Eating and drinking  Eat a diet that includes fresh fruits and vegetables, whole grains, lean protein, and low-fat dairy products. Take vitamin and mineral supplements as recommended by your health care provider. Do not drink alcohol if your health care provider tells you not to drink. If you drink alcohol: Limit how much you have to 0-2 drinks a day. Be aware of how much alcohol is in your drink. In the [place] one drink equals one 12 oz bottle of beer (355 mL), one 5 oz glass of wine (148 mL), or one 1 oz glass of hard liquor (44 mL). Lifestyle Take daily care of your teeth and gums. Brush your teeth every morning and night with fluoride toothpaste. Floss one time each day. Stay active. Exercise for at least 30 minutes 5 or more days each week. Do not use any products that  contain nicotine or tobacco, such as cigarettes, e-cigarettes, and chewing tobacco. If you need help quitting, ask your health care provider. Do not use drugs. If you are sexually active, practice safe sex. Use a condom or other form of protection to prevent STIs (sexually transmitted infections). If  told by your health care provider, take low-dose aspirin daily starting at age 27. Find healthy ways to cope with stress, such as: Meditation, yoga, or listening to music. Journaling. Talking to a trusted person. Spending time with friends and family. Safety Always wear your seat belt while driving or riding in a vehicle. Do not drive: If you have been drinking alcohol. Do not ride with someone who has been drinking. When you are tired or distracted. While texting. Wear a helmet and other protective equipment during sports activities. If you have firearms in your house, make sure you follow all gun safety procedures. What's next? Go to your health care provider once a year for an annual wellness visit. Ask your health care provider how often you should have your eyes and teeth checked. Stay up to date on all vaccines. This information is not intended to replace advice given to you by your health care provider. Make sure you discuss any questions you have with your health care provider. Document Revised: 04/09/2020 Document Reviewed: 01/24/2018 Elsevier Patient Education  2022 [employer].

## 2020-12-09 ENCOUNTER — Encounter: Payer: Self-pay | Admitting: Neurology

## 2020-12-23 ENCOUNTER — Ambulatory Visit: Payer: 59 | Admitting: Neurology

## 2020-12-29 ENCOUNTER — Encounter: Payer: Self-pay | Admitting: Neurology

## 2020-12-29 NOTE — Telephone Encounter (Signed)
I reviewed Dr. Carloyn Jaeger evaluation from 12/15/2020.  Amantadine 100 mg twice daily was added at the time, we can gradually try to reduce the ropinirole from 2 pills 3 times daily to 1 pill 3 times daily.  I replied back to the patient.

## 2021-01-30 ENCOUNTER — Encounter: Payer: Self-pay | Admitting: Medical

## 2021-01-31 ENCOUNTER — Ambulatory Visit: Payer: 59 | Admitting: Family Medicine

## 2021-01-31 VITALS — BP 101/76 | HR 75 | Temp 97.7°F | Resp 16 | Ht 72.0 in | Wt 181.0 lb

## 2021-01-31 DIAGNOSIS — R051 Acute cough: Secondary | ICD-10-CM | POA: Diagnosis not present

## 2021-01-31 DIAGNOSIS — J0140 Acute pansinusitis, unspecified: Secondary | ICD-10-CM | POA: Diagnosis not present

## 2021-01-31 LAB — POCT INFLUENZA A/B
Influenza A, POC: NEGATIVE
Influenza B, POC: NEGATIVE

## 2021-01-31 MED ORDER — PROMETHAZINE-DM 6.25-15 MG/5ML PO SYRP: 5.0000 mL | ORAL_SOLUTION | Freq: Four times a day (QID) | ORAL | 0 refills | Status: DC | PRN

## 2021-01-31 MED ORDER — FLUTICASONE PROPIONATE 50 MCG/ACT NA SUSP: 2.0000 | Freq: Every day | NASAL | 6 refills | Status: DC

## 2021-01-31 MED ORDER — DOXYCYCLINE HYCLATE 100 MG PO TABS: 100.0000 mg | ORAL_TABLET | Freq: Two times a day (BID) | ORAL | 0 refills | Status: DC

## 2021-01-31 NOTE — Assessment & Plan Note (Signed)
Doxycycline x10  Days flonase and cough syrup per orders rto if symtpoms do not improve Home covid neg and flu neg in office

## 2021-01-31 NOTE — Progress Notes (Signed)
Established Patient Office Visit  Subjective:  Patient ID: Mark [name], male    DOB: Dec 02, 1960  Age: 60 y.o. MRN: [id number]  CC:  Chief Complaint  Patient presents with   Nasal Congestion    Complains of nasal congestion, tested for covid 2 and 4 days ago  negative.    Fatigue    Complains of fatigue   Sore Throat    HPI Mark [name] presents for sinus pressure and congestion .  He has used neti pot and dayquil.  Pt did a covid test 2 days ago --- neg + body aches, no fever .  + cough--  keeping him up at night and its productive   Past Medical History:  Diagnosis Date   Family history of prostate cancer     DRE WNL; PSA never > 0.38   Fasting hyperglycemia     PMH of   GERD (gastroesophageal reflux disease)    Parkinson's disease (HCC)    Tinnitus    w/o hearing loss    Past Surgical History:  Procedure Laterality Date   COLONOSCOPY  05/2013   Neg, Dr Marina Goodell   KNEE SURGERY Right    TONSILLECTOMY AND ADENOIDECTOMY     VASECTOMY      Family History  Problem Relation Age of Onset   COPD Mother        smoker   Prostate cancer Father 100       no longer with prostate cancer   Breast cancer Sister    COPD Paternal Aunt        smoker   Stroke Maternal Grandfather         late 64s   Heart disease Neg Hx    Colon cancer Neg Hx    Esophageal cancer Neg Hx    Pancreatic cancer Neg Hx    Rectal cancer Neg Hx    Stomach cancer Neg Hx    Diabetes Neg Hx    Other Neg Hx        low testosterone    Social History   Socioeconomic History   Marital status: Married    Spouse name: Not on file   Number of children: Not on file   Years of education: Not on file   Highest education level: Not on file  Occupational History   Not on file  Tobacco Use   Smoking status: Never   Smokeless tobacco: Never  Substance and Sexual Activity   Alcohol use: Yes    Comment: very rare wine , < one per month per pt.   Drug use: No   Sexual activity: Yes  Other Topics  Concern   Not on file  Social History Narrative   Not on file   Social Determinants of Health   Financial Resource Strain: Not on file  Food Insecurity: Not on file  Transportation Needs: Not on file  Physical Activity: Not on file  Stress: Not on file  Social Connections: Not on file  Intimate Partner Violence: Not on file    Outpatient Medications Prior to Visit  Medication Sig Dispense Refill   carbidopa-levodopa (SINEMET IR) 25-100 MG tablet Take 1 tablet by mouth 3 (three) times daily. Follow written titration instructions provided separately. 90 tablet 5   escitalopram (LEXAPRO) 10 MG tablet Take 1 tablet (10 mg total) by mouth daily. 30 tablet 5   LORazepam (ATIVAN) 0.5 MG tablet 1 tab po bid prn anxiety 60 tablet 3   Multiple Vitamins-Minerals (MENS MULTIVITAMIN PLUS)  TABS Take by mouth daily.     rOPINIRole (REQUIP) 1 MG tablet Take 2 tablets (2 mg total) by mouth 3 (three) times daily. Follow instructions provided 540 tablet 3   cyclobenzaprine (FLEXERIL) 10 MG tablet TAKE ONE TABLET BY MOUTH EVERY NIGHT AT BEDTIME AS NEEDED FOR MUSCLE SPASMS 30 tablet 3   ferrous sulfate 324 (65 Fe) MG TBEC 1 tab po bid 90 tablet 1   vardenafil (LEVITRA) 20 MG tablet Take 1 tablet (20 mg total) by mouth daily as needed for erectile dysfunction. 10 tablet 0   No facility-administered medications prior to visit.    Allergies  Allergen Reactions   Penicillins     ? Reaction @ age 89    ROS Review of Systems  Constitutional:  Negative for appetite change, chills, diaphoresis, fatigue, fever and unexpected weight change.  HENT:  Positive for congestion, sinus pressure, sinus pain and sore throat. Negative for sneezing.   Eyes:  Negative for pain, redness and visual disturbance.  Respiratory:  Positive for cough. Negative for chest tightness, shortness of breath and wheezing.   Cardiovascular:  Negative for chest pain, palpitations and leg swelling.  Endocrine: Negative for cold  intolerance, heat intolerance, polydipsia, polyphagia and polyuria.  Genitourinary:  Negative for difficulty urinating, dysuria and frequency.  Neurological:  Negative for dizziness, light-headedness, numbness and headaches.     Objective:    Physical Exam Vitals and nursing note reviewed.  Constitutional:      Appearance: He is well-developed.  HENT:     Right Ear: External ear normal.     Left Ear: External ear normal.     Mouth/Throat:     Mouth: Mucous membranes are moist.     Pharynx: No pharyngeal swelling, oropharyngeal exudate or posterior oropharyngeal erythema.     Tonsils: No tonsillar exudate or tonsillar abscesses.  Eyes:     General:        Right eye: No discharge.        Left eye: No discharge.     Conjunctiva/sclera: Conjunctivae normal.  Cardiovascular:     Rate and Rhythm: Normal rate and regular rhythm.     Heart sounds: Normal heart sounds. No murmur heard. Pulmonary:     Effort: Pulmonary effort is normal. No respiratory distress.     Breath sounds: Normal breath sounds. No wheezing or rales.  Chest:     Chest wall: No tenderness.  Lymphadenopathy:     Cervical: Cervical adenopathy present.  Neurological:     Mental Status: He is alert and oriented to person, place, and time.    BP 101/76 (BP Location: Right Arm, Patient Position: Sitting, Cuff Size: Small)    Pulse 75    Temp 97.7 F (36.5 C) (Oral)    Resp 16    Ht 6' (1.829 m)    Wt 181 lb (82.1 kg)    SpO2 99%    BMI 24.55 kg/m  Wt Readings from Last 3 Encounters:  01/31/21 181 lb (82.1 kg)  12/06/20 182 lb (82.6 kg)  11/11/20 181 lb 6.4 oz (82.3 kg)     Health Maintenance Due  Topic Date Due   Pneumococcal Vaccine 13-75 Years old (1 - PCV) Never done   Hepatitis C Screening  Never done   Zoster Vaccines- Shingrix (1 of 2) Never done   COVID-19 Vaccine (2 - Pfizer risk series) 06/19/2019    There are no preventive care reminders to display for this patient.  Lab Results  Component  Value Date   TSH 1.35 05/01/2019   Lab Results  Component Value Date   WBC 4.6 12/06/2020   HGB 14.2 12/06/2020   HCT 42.4 12/06/2020   MCV 96.0 12/06/2020   PLT 156.0 12/06/2020   Lab Results  Component Value Date   NA 141 12/06/2020   K 4.0 12/06/2020   CO2 29 12/06/2020   GLUCOSE 94 12/06/2020   BUN 16 12/06/2020   CREATININE 0.99 12/06/2020   BILITOT 0.8 12/06/2020   ALKPHOS 64 12/06/2020   AST 24 12/06/2020   ALT 28 12/06/2020   PROT 6.9 12/06/2020   ALBUMIN 4.3 12/06/2020   CALCIUM 9.4 12/06/2020   GFR 84.27 12/06/2020   Lab Results  Component Value Date   CHOL 201 (H) 12/06/2020   Lab Results  Component Value Date   HDL 77.60 12/06/2020   Lab Results  Component Value Date   LDLCALC 115 (H) 12/06/2020   Lab Results  Component Value Date   TRIG 42.0 12/06/2020   Lab Results  Component Value Date   CHOLHDL 3 12/06/2020   Lab Results  Component Value Date   HGBA1C 5.5 11/14/2007      Assessment & Plan:   Problem List Items Addressed This Visit       Unprioritized   Acute non-recurrent pansinusitis - Primary    Doxycycline x10  Days flonase and cough syrup per orders rto if symtpoms do not improve Home covid neg and flu neg in office      Relevant Medications   doxycycline (VIBRA-TABS) 100 MG tablet   fluticasone (FLONASE) 50 MCG/ACT nasal spray   promethazine-dextromethorphan (PROMETHAZINE-DM) 6.25-15 MG/5ML syrup   Other Visit Diagnoses     Acute cough       Relevant Orders   POCT Influenza A/B (Completed)       Meds ordered this encounter  Medications   doxycycline (VIBRA-TABS) 100 MG tablet    Sig: Take 1 tablet (100 mg total) by mouth 2 (two) times daily.    Dispense:  20 tablet    Refill:  0   fluticasone (FLONASE) 50 MCG/ACT nasal spray    Sig: Place 2 sprays into both nostrils daily.    Dispense:  16 g    Refill:  6   promethazine-dextromethorphan (PROMETHAZINE-DM) 6.25-15 MG/5ML syrup    Sig: Take 5 mLs by mouth 4  (four) times daily as needed.    Dispense:  118 mL    Refill:  0    Follow-up: Return if symptoms worsen or fail to improve.    Donato Schultz, DO

## 2021-01-31 NOTE — Patient Instructions (Signed)

## 2021-02-19 ENCOUNTER — Other Ambulatory Visit: Payer: Self-pay | Admitting: Medical

## 2021-02-22 ENCOUNTER — Encounter: Payer: Self-pay | Admitting: Medical

## 2021-02-23 ENCOUNTER — Telehealth: Payer: Self-pay | Admitting: Medical

## 2021-02-23 MED ORDER — TADALAFIL 20 MG PO TABS
10.0000 mg | ORAL_TABLET | ORAL | 11 refills | Status: DC | PRN
Start: 2021-02-23 — End: 2022-07-28

## 2021-02-23 NOTE — Telephone Encounter (Signed)
Please advise 

## 2021-02-23 NOTE — Telephone Encounter (Signed)
Chart opened to rx med, order lab, review chart, respond to my chart message or send message to staff member. ° °Boy Delamater, PA-C  °

## 2021-02-27 ENCOUNTER — Other Ambulatory Visit: Payer: Self-pay | Admitting: Neurology

## 2021-02-27 ENCOUNTER — Encounter: Payer: Self-pay | Admitting: Neurology

## 2021-02-27 DIAGNOSIS — G20 Parkinson's disease: Secondary | ICD-10-CM

## 2021-03-10 ENCOUNTER — Encounter: Payer: Self-pay | Admitting: Neurology

## 2021-03-10 ENCOUNTER — Other Ambulatory Visit: Payer: Self-pay | Admitting: Medical

## 2021-03-10 MED ORDER — ROPINIROLE HCL 1 MG PO TABS: 2.0000 mg | ORAL_TABLET | Freq: Every evening | ORAL | 1 refills | Status: DC | PRN

## 2021-03-10 NOTE — Telephone Encounter (Signed)
Prescription changed per v.o. Dr Frances Furbish. Now says 2 tablets at night for Requip.

## 2021-03-10 NOTE — Telephone Encounter (Addendum)
Requesting: ativan Contract:11/11/20 UDS:11/11/20 Last Visit:01/31/21 Next Visit:n/a Last Refill:11/11/20  Please Advise  Rx refill sent to pt pharmacy.  Esperanza Richters, PA-C

## 2021-03-22 ENCOUNTER — Ambulatory Visit: Payer: 59 | Admitting: Medical

## 2021-03-22 ENCOUNTER — Encounter: Payer: Self-pay | Admitting: Medical

## 2021-03-22 VITALS — BP 112/65 | HR 74 | Temp 98.1°F | Resp 16 | Ht 72.0 in | Wt 174.4 lb

## 2021-03-22 DIAGNOSIS — F419 Anxiety disorder, unspecified: Secondary | ICD-10-CM | POA: Diagnosis not present

## 2021-03-22 DIAGNOSIS — J0140 Acute pansinusitis, unspecified: Secondary | ICD-10-CM | POA: Diagnosis not present

## 2021-03-22 DIAGNOSIS — R051 Acute cough: Secondary | ICD-10-CM

## 2021-03-22 DIAGNOSIS — J40 Bronchitis, not specified as acute or chronic: Secondary | ICD-10-CM | POA: Diagnosis not present

## 2021-03-22 MED ORDER — AZITHROMYCIN 250 MG PO TABS: ORAL_TABLET | ORAL | 0 refills | Status: AC

## 2021-03-22 MED ORDER — BENZONATATE 100 MG PO CAPS
100.0000 mg | ORAL_CAPSULE | Freq: Three times a day (TID) | ORAL | 0 refills | Status: DC | PRN
Start: 2021-03-22 — End: 2021-11-25

## 2021-03-22 MED ORDER — FLUTICASONE PROPIONATE 50 MCG/ACT NA SUSP: 2.0000 | Freq: Every day | NASAL | 6 refills | Status: DC

## 2021-03-22 NOTE — Progress Notes (Signed)
Subjective:    Patient ID: Mark [name], male    DOB: [birth year], 61 y.o.   MRN: [id number]  HPI Pt in with 6 days of mild st, productive cough and mild nasal congestion. He states if working symptoms are worse.   He took Friday off from work. Saturday felt better so went on job/walk for 2 miles.  Felt tired but no fever, no chills, no sweats or bodyaches.  4 days ago tested for covid and was negative.   Pt had covid in past twice.    Also due for controlled med visit.   Anxiety- Controlled with lorazepam. Currently on 1 tab twice a day. Just got refill per pt. He is up to date on uds and controlled med contract.   Review of Systems  Constitutional:  Negative for chills, fatigue and fever.  HENT:  Positive for congestion and sore throat. Negative for postnasal drip.   Respiratory:  Positive for cough. Negative for chest tightness, shortness of breath and wheezing.   Cardiovascular:  Negative for chest pain and palpitations.  Gastrointestinal:  Negative for abdominal pain.  Musculoskeletal:  Negative for back pain.  Skin:  Negative for rash.  Neurological:  Negative for dizziness and headaches.  Hematological:  Negative for adenopathy. Does not bruise/bleed easily.  Psychiatric/Behavioral:  Negative for behavioral problems and confusion.     Past Medical History:  Diagnosis Date   Family history of prostate cancer     DRE WNL; PSA never > 0.38   Fasting hyperglycemia     PMH of   GERD (gastroesophageal reflux disease)    Parkinson's disease (HCC)    Tinnitus    w/o hearing loss     Social History   Socioeconomic History   Marital status: Married    Spouse name: Not on file   Number of children: Not on file   Years of education: Not on file   Highest education level: Not on file  Occupational History   Not on file  Tobacco Use   Smoking status: Never   Smokeless tobacco: Never  Substance and Sexual Activity   Alcohol use: Yes    Comment: very rare  wine , < one per month per pt.   Drug use: No   Sexual activity: Yes  Other Topics Concern   Not on file  Social History Narrative   Not on file   Social Determinants of Health   Financial Resource Strain: Not on file  Food Insecurity: Not on file  Transportation Needs: Not on file  Physical Activity: Not on file  Stress: Not on file  Social Connections: Not on file  Intimate Partner Violence: Not on file    Past Surgical History:  Procedure Laterality Date   COLONOSCOPY  05/2013   Neg, Dr Marina Goodell   KNEE SURGERY Right    TONSILLECTOMY AND ADENOIDECTOMY     VASECTOMY      Family History  Problem Relation Age of Onset   COPD Mother        smoker   Prostate cancer Father 30       no longer with prostate cancer   Breast cancer Sister    COPD Paternal Aunt        smoker   Stroke Maternal Grandfather         late 27s   Heart disease Neg Hx    Colon cancer Neg Hx    Esophageal cancer Neg Hx    Pancreatic cancer Neg Hx  Rectal cancer Neg Hx    Stomach cancer Neg Hx    Diabetes Neg Hx    Other Neg Hx        low testosterone    Allergies  Allergen Reactions   Penicillins     ? Reaction @ age 56    Current Outpatient Medications on File Prior to Visit  Medication Sig Dispense Refill   carbidopa-levodopa (SINEMET IR) 25-100 MG tablet Take 1 tablet by mouth 3 (three) times daily. 90 tablet 5   doxycycline (VIBRA-TABS) 100 MG tablet Take 1 tablet (100 mg total) by mouth 2 (two) times daily. 20 tablet 0   escitalopram (LEXAPRO) 10 MG tablet Take 1 tablet (10 mg total) by mouth daily. 30 tablet 5   fluticasone (FLONASE) 50 MCG/ACT nasal spray Place 2 sprays into both nostrils daily. 16 g 6   LORazepam (ATIVAN) 0.5 MG tablet TAKE ONE TABLET BY MOUTH TWICE A DAY AS NEEDED FOR ANXIETY 60 tablet 0   Multiple Vitamins-Minerals (MENS MULTIVITAMIN PLUS) TABS Take by mouth daily.     promethazine-dextromethorphan (PROMETHAZINE-DM) 6.25-15 MG/5ML syrup Take 5 mLs by mouth 4  (four) times daily as needed. 118 mL 0   rOPINIRole (REQUIP) 1 MG tablet Take 2 tablets (2 mg total) by mouth at bedtime as needed. 180 tablet 1   tadalafil (CIALIS) 20 MG tablet Take 0.5-1 tablets (10-20 mg total) by mouth every other day as needed for erectile dysfunction. 10 tablet 11   No current facility-administered medications on file prior to visit.    BP 106/64 (BP Location: Right Arm, Patient Position: Sitting, Cuff Size: Normal)    Pulse 74    Temp 98.1 F (36.7 C) (Oral)    Resp 16    Ht 6' (1.829 m)    Wt 174 lb 6.4 oz (79.1 kg)    SpO2 100%    BMI 23.65 kg/m       Objective:   Physical Exam  General Mental Status- Alert. General Appearance- Not in acute distress.   Skin General: Color- Normal Color. Moisture- Normal Moisture.  Neck Carotid Arteries- Normal color. Moisture- Normal Moisture. No carotid bruits. No JVD.  Chest and Lung Exam Auscultation: Breath Sounds:-Normal.  Cardiovascular Auscultation:Rythm- Regular. Murmurs & Other Heart Sounds:Auscultation of the heart reveals- No Murmurs.  Abdomen Inspection:-Inspeection Normal. Palpation/Percussion:Note:No mass. Palpation and Percussion of the abdomen reveal- Non Tender, Non Distended + BS, no rebound or guarding.   Neurologic Cranial Nerve exam:- CN III-XII intact(No nystagmus), symmetric smile. Strength:- 5/5 equal and symmetric strength both upper and lower extremities.       Assessment & Plan:   Patient Instructions  You appear to have bronchitis. Rest hydrate and tylenol for fever. I am prescribing cough medicine benzonatate and azithromycin antibiotic. For your nasal congestion rx flonase.  You should gradually get better. If not then get chest xray.  For anxiety continue with ativan. Up to date on controlled med contract and uds. Let me know when you need a refill.  Follow up in 7-10 days or as needed    [employer], [employer]

## 2021-03-22 NOTE — Patient Instructions (Addendum)
You appear to have bronchitis. Rest hydrate and tylenol for fever. I am prescribing cough medicine benzonatate and azithromycin antibiotic. For your nasal congestion rx flonase.  You should gradually get better. If not then get chest xray. Future chest xray placed.  For anxiety continue with ativan. Up to date on controlled med contract and uds. Let me know when you need a refill.  Follow up in 7-10 days or as needed

## 2021-03-25 ENCOUNTER — Encounter: Payer: Self-pay | Admitting: Neurology

## 2021-04-11 ENCOUNTER — Other Ambulatory Visit: Payer: Self-pay | Admitting: Medical

## 2021-04-11 NOTE — Telephone Encounter (Addendum)
Requesting: lorazepam 0.5mg   Contract: 11/11/2020 UDS: 10/22/2020 Last Visit: 03/22/2021 Next Visit: None Last Refill: 03/12/2021 #60 and 0RF  Please Advise Rx ativan sent to pharmacy.  Esperanza Richters, PA-C

## 2021-04-14 ENCOUNTER — Encounter: Payer: Self-pay | Admitting: Neurology

## 2021-04-14 ENCOUNTER — Ambulatory Visit: Payer: 59 | Admitting: Neurology

## 2021-04-14 VITALS — BP 117/77 | HR 81 | Ht 73.0 in | Wt 171.4 lb

## 2021-04-14 DIAGNOSIS — G20 Parkinson's disease: Secondary | ICD-10-CM

## 2021-04-14 DIAGNOSIS — G479 Sleep disorder, unspecified: Secondary | ICD-10-CM | POA: Diagnosis not present

## 2021-04-14 DIAGNOSIS — M65342 Trigger finger, left ring finger: Secondary | ICD-10-CM | POA: Diagnosis not present

## 2021-04-14 DIAGNOSIS — R202 Paresthesia of skin: Secondary | ICD-10-CM

## 2021-04-14 DIAGNOSIS — G20A1 Parkinson's disease without dyskinesia, without mention of fluctuations: Secondary | ICD-10-CM

## 2021-04-14 MED ORDER — GABAPENTIN 300 MG PO CAPS: 300.0000 mg | ORAL_CAPSULE | Freq: Every evening | ORAL | 5 refills | Status: DC | PRN

## 2021-04-14 MED ORDER — ESCITALOPRAM OXALATE 10 MG PO TABS: 10.0000 mg | ORAL_TABLET | Freq: Every morning | ORAL | 3 refills | Status: DC

## 2021-04-14 NOTE — Progress Notes (Signed)
Subjective:    [name] ID: Mark [name] is a 61 y.o. male.  HPI    Interim history:   Mark [name] is a 61 year old right-handed gentleman with an underlying medical history of neck pain, reflux disease, chronic tinnitus, anxiety, and allergic rhinitis, who presents for follow-up consultation of his left-sided predominant Parkinson's disease with akinetic-rigid presentation.  Mark [name] is unaccompanied today. I last saw Mark [name] on 08/18/2020, at which time Mark [name] felt fairly stable but his wife felt that Mark [name] had become slower.  Mark [name] had limited his driving to mostly just going to work.  Mark [name] is taking ropinirole 3 times a day and exercising regularly.  Mark [name] was on low-dose Lexapro 5 mg strength and on a slightly reduced dose of Ativan per PCP.  Mark [name] was advised to start Sinemet at Mark time.  We talked about a referral to Dr. Linus [name] for DBS evaluation.  Appointment with Dr. Linus [name] at [facility] and I reviewed Mark office note from 12/15/2020.  Mark [name] was felt to be a good candidate for DBS but since Mark [name] was doing fairly well on medication regimen, further evaluation was held for surgical treatment of his Parkinson's disease.  Mark [name] was advised to start amantadine 100 mg twice daily.    We had several email interactions in Mark interim.  In October 2022 we increased his Lexapro from 5 mg daily to 10 mg daily.  Mark [name] also reported in Mark interim that Mark [name] had more sleepiness from Mark ropinirole and had reduced it from 1 mg 3 times daily to 2 mg at bedtime only.    In February 2023, Mark [name] reported having difficulty sleeping.  Mark [name] was advised to take amantadine in Mark morning and midday and try melatonin at night.  Today, 04/14/2021: Mark [name] reports doing fairly with regards to his motor symptoms, his sleep is Mark most bothersome problem currently, when Mark [name] lays down in bed Mark [name] starts having a tingling or tingling sensation on Mark left side more than right, it is bothersome enough for Mark [name] to not fall asleep right away.  Mark [name] may go to Mark living  room and sleep on Mark couch, when Mark [name] wakes up in Mark middle of Mark night Mark [name] can go back to sleep in his bed.  Mark [name] feels that Mark [name] sleeps altogether okay but sleep is certainly interrupted.  Restless legs are under control since Mark [name] switched his ropinirole to bedtime.  Mark [name] is taking 2 mg at bedtime and none during Mark day and his side effects such as sleepiness and mood irritability seem to be better.  When Mark [name] switched Lexapro from bedtime to daytime Mark [name] slept a little better for about a week.  Melatonin did not help and Mark [name] felt that Mark [name] had had a paradoxical effect on Mark [name].  Some years ago, in fact in September 2020 we briefly tried Mark [name] on very low-dose gabapentin 100 mg strength and Mark [name] reported in December 2020 that Mark [name] stopped using it as it did not help.  Mark [name] is willing to try it again at a slightly higher dose at this point.  Cognitively Mark [name] is doing well, workwise, things are going well.  No issues with significant constipation, no recent falls, continues to stay active.  Mark [name] continues to take amantadine 100 mg twice daily, morning and lunchtime.   Mark [name]'s allergies, current medications, family history, past medical history, past social history, past surgical history and problem list were reviewed and updated as appropriate.    Previously  I saw Mark [name] on 02/19/2020, at which  time Mark [name] felt fairly well.  Mark [name] had completed therapy through neuro rehab and had benefited from therapy.  Mark [name] did not tolerate Mark once daily long-acting ropinirole.  Mark [name] was on Lexapro 5 mg once daily with good tolerance.  Mark [name] was on ropinirole 1 mg strength 1-1/2 pills 3 times a day.  Mark [name] felt it was helpful.  Mark [name] was complaining of fatigue and exhaustion.  Mark [name] is trying to stay active physically.  Mark [name] was advised to gradually increase Mark ropinirole to eventually take 2 pills 3 times daily.       Mark [name] missed an appointment on 02/10/20. I saw Mark [name] on 11/12/2019, at which time Mark [name] reported feeling better with regards to his Covid symptoms.  We mutually  agreed to taper his Neupro patch, as Mark [name] did not have much in Mark way of benefit from it.  For anxiety, Mark [name] was advised to start Lexapro generic low-dose 5 mg strength once daily.  Mark [name] was advised to start ropinirole immediate release.  Mark [name] emailed back with Mark side effects on Mark ropinirole particularly worsening dizziness and sleepiness.  Mark [name] was advised to change from immediate release ropinirole to long-acting ropinirole once daily 6 mg strength.     I saw Mark [name] on 05/22/2019, at which time Mark [name] reported that his dizziness had improved after Mark [name] reduced Mark Neupro back to 6 mg daily from 8 mg daily.  Mark [name] has had intermittent issues with anxiety.  Back stiffness was fairly stable, Flexeril as needed was helpful.  His wife was worried about his extensive exercise and that Mark [name] would tire himself out.  We talked about moderation when Mark [name] came to work schedule and exercising.  Mark [name] reported some tingling on Mark left side and feeling a cold sensation in Mark left arm and hand intermittently.  Mark [name] did not have any significant constipation or sleep issues.  Mark [name] and his wife were worried about his EEG.  Mark [name] had tried Viagra but had side effects.  Mark [name] had a prescription for Cialis but had not tried it.  I encouraged Mark [name] to consider seeing a urologist and talk to his primary care provider about a referral, if Mark Cialis was not helpful. Mark [name] was advised to continue with Mark Neupro patch 6 mg once daily.       I saw Mark [name] on 01/21/2019, at which time Mark [name] reported feeling better with regards to his restless leg symptoms.  Mark [name] was taking melatonin at night, 5 mg strength.  Mark [name] was noticing more stiffness and slowness.  I suggested we increase his Neupro to 8 mg daily.  Mark [name] also reported back stiffness and back pain in Mark mid back area, was taking ibuprofen as needed and I suggested a trial of Flexeril.  Mark [name] emailed in Mark interim in January 2021, reporting some bouts of constipation.  Mark [name] and his wife also emailed through [employer] last month  reporting that Mark [name] felt dizzy and lightheaded.  Eventually, we decided to reduce his Neupro patch back to 6 mg daily.     I saw Mark [name] on 10/01/2018, at which time Mark [name] reported feeling better after starting his medication.  Mark [name] also was not using his Ativan as much.  Mark [name] was working full-time, very active overall, walking a lot.  Mark [name] had noticed some intermittent sensory symptoms, nothing sustained.  Mark [name] was advised to continue with Mark Neupro patch 6 mg strength.   Mark [name] emailed in Mark interim with increase in restless leg symptoms, Mark [name] was advised to try gabapentin in September 2020.  I saw Mark [name] on 07/02/2018 in virtual visit, at which time we mutually agreed to start Mark [name] on Neupro patch.  We increased it in Mark recent past to 6 mg daily.  Mark [name] emailed recently regarding some swelling Mark [name] had noticed in Mark left upper and lower extremities.  Mark [name] had an ultrasound of Mark left upper extremity which was negative for a blood clot.       I first met Mark [name] on 04/03/2018 at Mark request of Dr. Barbaraann Barthel, at which time Mark [name] reported a several month history, probably a 25-monthhistory of left hand tremor, neck stiffness, difficulty with fine motor control with Mark left upper extremity, and hand stiffness.  His history and physical examination were in keeping with parkinsonism on Mark left side.  We talked about symptomatic treatment options to be utilized soon, I suggested we proceed with a nuclear medicine DaTscan.  Due to Mark COVID-19 pandemic, his scan has not yet been done, Mark [name] was approved for it though.   Mark [name] sent a MyChart message in Mark interim in late March 2020 indicating that Mark [name] has noticed more symptoms including some difficulty with his speech, including slurring, increased salivation, causing drooling at times and some symptoms in his right hand.     04/03/2018: (Mark [name]) reports a left hand tremor for Mark past several months, likely 6-9 months. In addition, Mark [name] has noted difficulty with fine motor control, Mark [name]  has noticed neck stiffness and left hand stiffness and difficulty performing fine motor skills with Mark left hand. I reviewed your office note from 01/17/2018. Mark [name] has had radiating neck pain to Mark left arm. Mark [name] has had physical therapy for this. His neck pain has improved. Mark [name] fell in 2017 and had neck pain. Mark [name] had a X ray neck on 03/26/17: IMPRESSION: No acute findings.   Mild spondylosis of Mark cervical spine with moderate to severe multilevel bilateral neural foraminal narrowing as described.   Mark [name] denies a family history of Parkinson's disease or tremor. Mark [name] denies any recent falls. Mark [name] denies any constipation. Mood is fairly stable but Mark [name] does admit to having a longer standing history of anxiety. Mark [name] takes Ativan as needed for this. Mark [name] also reports claustrophobia and having had difficulty going through an MRI in Mark past.   Mark [name] has had some issues with becoming slower in his mobility. His brother recently commented on this. Mark [name] is Mark youngest of 4 siblings, Mark [name] has an older brother, then his sister, then another brother. Mark [name] lives with his family which includes his wife and 133year old daughter and 157year old son. 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 Mark [name] tries to hydrate well. Mark [name] exercises regularly.  His Past Medical History Is Significant For: Past Medical History:  Diagnosis Date   Family history of prostate cancer     DRE WNL; PSA never > 0.38   Fasting hyperglycemia     PMH of   GERD (gastroesophageal reflux disease)    Parkinson's disease (HCC)    Tinnitus    w/o hearing loss    His Past Surgical History Is Significant For: Past Surgical History:  Procedure Laterality Date   COLONOSCOPY  05/2013   Neg, Dr PHenrene Pastor  KNEE SURGERY Right    TONSILLECTOMY AND ADENOIDECTOMY     VASECTOMY      His Family  History Is Significant For: Family  History  Problem Relation Age of Onset   COPD Mother        smoker   Prostate cancer Father 14       no longer with prostate cancer   Breast cancer Sister    COPD Paternal Aunt        smoker   Stroke Maternal Grandfather         late 45s   Heart disease Neg Hx    Colon cancer Neg Hx    Esophageal cancer Neg Hx    Pancreatic cancer Neg Hx    Rectal cancer Neg Hx    Stomach cancer Neg Hx    Diabetes Neg Hx    Other Neg Hx        low testosterone   Parkinson's disease Neg Hx     His Social History Is Significant For: Social History   Socioeconomic History   Marital status: Married    Spouse name: Not on file   Number of children: Not on file   Years of education: Not on file   Highest education level: Not on file  Occupational History   Not on file  Tobacco Use   Smoking status: Never   Smokeless tobacco: Never  Substance and Sexual Activity   Alcohol use: Yes    Comment: very rare wine , < one per month per pt.   Drug use: No   Sexual activity: Yes  Other Topics Concern   Not on file  Social History Narrative   Not on file   Social Determinants of Health   Financial Resource Strain: Not on file  Food Insecurity: Not on file  Transportation Needs: Not on file  Physical Activity: Not on file  Stress: Not on file  Social Connections: Not on file    His Allergies Are:  Allergies  Allergen Reactions   Penicillins     ? Reaction @ age 45  :   His Current Medications Are:  Outpatient Encounter Medications as of 04/14/2021  Medication Sig   benzonatate (TESSALON) 100 MG capsule Take 1 capsule (100 mg total) by mouth 3 (three) times daily as needed for cough.   carbidopa-levodopa (SINEMET IR) 25-100 MG tablet Take 1 tablet by mouth 3 (three) times daily.   doxycycline (VIBRA-TABS) 100 MG tablet Take 1 tablet (100 mg total) by mouth 2 (two) times daily.   escitalopram (LEXAPRO) 10 MG tablet Take 1 tablet (10 mg total) by mouth daily.   fluticasone (FLONASE) 50  MCG/ACT nasal spray Place 2 sprays into both nostrils daily.   LORazepam (ATIVAN) 0.5 MG tablet TAKE ONE TABLET BY MOUTH TWICE A DAY AS NEEDED FOR ANXIETY   Multiple Vitamins-Minerals (MENS MULTIVITAMIN PLUS) TABS Take by mouth daily.   promethazine-dextromethorphan (PROMETHAZINE-DM) 6.25-15 MG/5ML syrup Take 5 mLs by mouth 4 (four) times daily as needed.   rOPINIRole (REQUIP) 1 MG tablet Take 2 tablets (2 mg total) by mouth at bedtime as needed.   tadalafil (CIALIS) 20 MG tablet Take 0.5-1 tablets (10-20 mg total) by mouth every other day as needed for erectile dysfunction.   No facility-administered encounter medications on file as of 04/14/2021.  :  Review of Systems:  Out of a complete 14 point review of systems, all are reviewed and negative with Mark exception of these symptoms as listed below:   Review of Systems  Neurological:        Pt is here for Parkinson's follow up . Pt  states Mark [name] is not getting much sleep and his left hand is stiff .   Objective:  Neurological Exam  Physical Exam Physical Examination:   Vitals:   04/14/21 1411  BP: 117/77  Pulse: 81    General Examination: Mark [name] is a very pleasant 61 y.o. male in no acute distress. Mark [name] appears well-developed and well-nourished and well groomed.   HEENT: Normocephalic, atraumatic, pupils are equal, round and reactive to light and accommodation. Mark [name] has moderate facial masking and decreased eye blink rate.  Tracking mildly slow but no nystagmus.  Hearing is grossly intact. Face is otherwise symmetric, no dysarthria, mild hypophonia. Mark [name] has moderate nuchal rigidity. No carotid bruits. Airway examination reveals no significant mouth dryness, otherwise tongue protrudes centrally and palate elevates symmetrically, and no lip, neck or jaw tremor. No sialorrhea.   Chest: Clear to auscultation without wheezing, rhonchi or crackles noted.   Heart: S1+S2+0, regular and normal without murmurs, rubs or gallops noted.   Abdomen:  Soft, non-tender and non-distended.   Extremities: There is no pitting edema in Mark distal lower extremities bilaterally.   Skin: Warm and dry without trophic changes noted.   Musculoskeletal: exam reveals increase in stiffness in Mark left upper extremity with trigger finger left ring finger.     Neurologically: Mental status: Mark [name] is awake, alert and oriented in all 4 spheres. His immediate and remote memory, attention, language skills and fund of knowledge are appropriate. There is evidence of bradyphrenia.    (On 04/03/2018: on Archimedes spiral drawing Mark [name] has no significant trembling with either hand, handwriting with Mark right hand is legible, not tremulous, slightly micrographic.)   Mark [name] has no obvious resting tremor, no significant postural or action tremor. Cranial nerves II - XII are as described above under HEENT exam. At baseline, mildly elevated right shoulder noted, unchanged.  Motor exam: Normal bulk, and strength, tone is increased in both upper extremities, more noticeable on Mark left upper extremity compared to Mark right.  Mark [name] has no obvious resting tremor, slight postural tremor, fine motor skills are moderately impaired on Mark left side, mildly impaired on Mark right. Cerebellar testing: No dysmetria or intention tremor. There is no truncal or gait ataxia. Sensory exam: intact to light touch in Mark upper and lower extremities. Gait, station and balance: Mark [name] stands without difficulty, posture is mildly stooped for age, seems a little worse compared to last time. Gait shows fairly good pace and stride length but near-absence of arm swing on Mark left, decreased arm swing on Mark right. Balance appears preserved.   Assessment and plan:    In summary, Trase Bunda is a very pleasant 61 year old male with an underlying medical history of neck pain, reflux disease, chronic tinnitus, anxiety, and allergic rhinitis, who presents for FU consultation of his L sided Parkinson's  disease. His DaT scan from 08/07/18 showed "Near absent activity in Mark putamen and asymmetric decreased activity in Mark head of Mark RIGHT caudate nucleus is a pattern suggestive of Parkinson's syndrome pathology." Mark [name] was on Neupro, which we started in May 2020, we increased this to 8 mg strength once daily in December 2020 but Mark [name] started having lightheadedness and more dizziness and last month we reduced it back to 6 mg once daily. Mark [name] has had more fatigue and exhaustion, particularly in Mark early afternoon hours.  Mark [name] works full-time and has to walk and stand a lot at work.  Mark [name] also exercises on a regular basis.  Mark [name] had Covid infection  in July 2021. Mark [name] was partially vaccinated in April 2021.  Mark [name] increased his Neupro back to 8 mg once daily we eventually decided to stop this medication.  Mark [name] was advised to switch to ropinirole immediate release with gradual tapering of Mark Neupro.  Mark [name] reported dizziness and sleepiness after starting ropinirole and I changed his immediate release Requip to long-acting Requip 6 mg strength.  Mark [name] felt that Mark [name] did not tolerate Mark long-acting Requip and decided to restart Mark immediate release ropinirole.  We increased this gradually to 2 mg 3 times daily after our visit in January 2022.  Mark [name] eventually switched it to 2 mg nightly in late 2022 or early 2023.  Melatonin did not help his sleep, Mark [name] has had intermittent paresthesias on Mark left side.  Mark [name] tried gabapentin briefly in September 2020, I suggested we retry gabapentin at 300 mg at bedtime.  Mark [name] is advised to continue with Sinemet, ropinirole, amantadine, and Lexapro at Mark current doses and timings.  Mark [name] is advised to start gabapentin 300 mg at bedtime.  Mark [name] can use this as needed at night, if Mark [name] does not want to take it nightly, I suggested Mark [name] take it about 1/2-hour before his bedtime.  Mark [name] is advised to follow-up routinely in 6 months, sooner if needed.  I answered all his questions today and Mark [name] was in agreement. I spent 30 minutes in  total face-to-face time and in reviewing records during pre-charting, more than 50% of which was spent in counseling and coordination of care, reviewing test results, reviewing medications and treatment regimen and/or in discussing or reviewing Mark diagnosis of PD, Mark prognosis and treatment options. Pertinent laboratory and imaging test results that were available during this visit with Mark [name] were reviewed by me and considered in my medical decision making (see chart for details).

## 2021-04-14 NOTE — Patient Instructions (Addendum)
It was nice to see you again today.  As discussed, we will keep you on your current medications including levodopa 1 pill 3 times daily, amantadine 1 pill twice daily, ropinirole 2 mg at bedtime and Lexapro 10 mg in the morning.  For your abnormal sensations and difficulty sleeping at night, I suggest we try you on a medication called gabapentin.  You briefly tried it in low-dose at 100 mg at bedtime in late 2020. You had felt at the time that it was not helping.  I suggest we try you on 300 mg at bedtime, you can use it as needed at bedtime if you like.  I would suggest that you take it about half an hour before your projected bedtime.  Follow-up in 6 months routinely, keep [place] posted via email. ?As discussed, for your trigger finger I placed a referral to hand surgery.  If you do not hear from them within the next 2 weeks, call us back or email [place] through [place]. ?

## 2021-04-18 ENCOUNTER — Telehealth: Payer: Self-pay | Admitting: Neurology

## 2021-04-18 NOTE — Telephone Encounter (Signed)
Sent to Dr. Kuzma ph # [phone]. 

## 2021-05-24 ENCOUNTER — Other Ambulatory Visit: Payer: Self-pay | Admitting: *Deleted

## 2021-05-24 ENCOUNTER — Encounter: Payer: Self-pay | Admitting: Neurology

## 2021-05-24 MED ORDER — GABAPENTIN 100 MG PO CAPS: 100.0000 mg | ORAL_CAPSULE | Freq: Every day | ORAL | 5 refills | Status: DC

## 2021-05-24 NOTE — Addendum Note (Signed)
Addended by: Raynald Kemp A on: 05/24/2021 11:11 AM ? ? Modules accepted: Orders ? ?

## 2021-06-17 IMAGING — NM NUCLEAR MEDICINE DATSCAN
4 series · 33 of 40 positions shown · non-contrast
Comparison: None available

CLINICAL DATA: 57-year-old male, LEFT hand tremors with fine motor
skills. LEFT-sided

EXAM:
NUCLEAR MEDICINE BRAIN IMAGING WITH SPECT  (DaTscan )
TECHNIQUE: SPECT images of the brain were obtained after intravenous injection
of radiopharmaceutical. 4 hour post injection imaging. Appropriate
positioning. 0.8 ml lugols solution administered in a.m
RADIOPHARMACEUTICALS:  3.9 millicuries I 123 Ioflupane

[Series 1: spect - (id) _(id)_cor · 4.1mm · 4.14mm/px · 5 of 128 frames shown]
[frame 11/128]
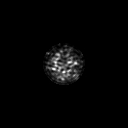
[frame 32/128]
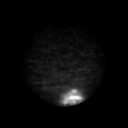
[frame 75/128]
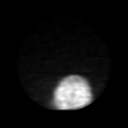
[frame 96/128]
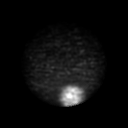
[frame 118/128]
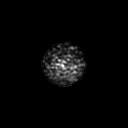

[Series 1: brain spect · 4.14mm/px · 5 of 120 frames shown]
[frame 11/120  full-range]
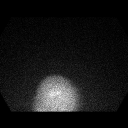
[frame 31/120  full-range]
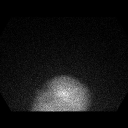
[frame 71/120  full-range]
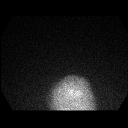
[frame 91/120  full-range]
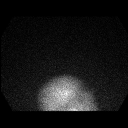
[frame 111/120  full-range]
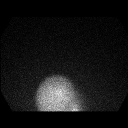

[Series 1: spect - (id) _(id)_tra · 4.1mm · 4.14mm/px · 5 of 128 frames shown]
[frame 11/128]
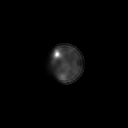
[frame 32/128]
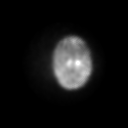
[frame 75/128]
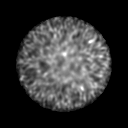
[frame 96/128]
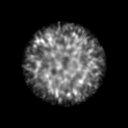
[frame 118/128]
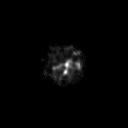

[Series 1016: mpr (id) range · 0.95mm/px · 18 of 43 slices shown]
[im 1/43]
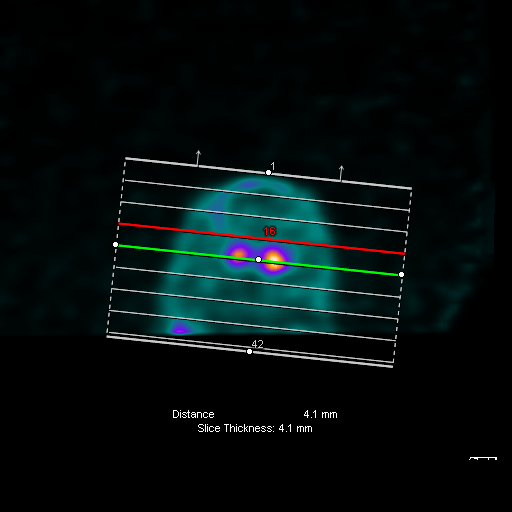
[im 5/43]
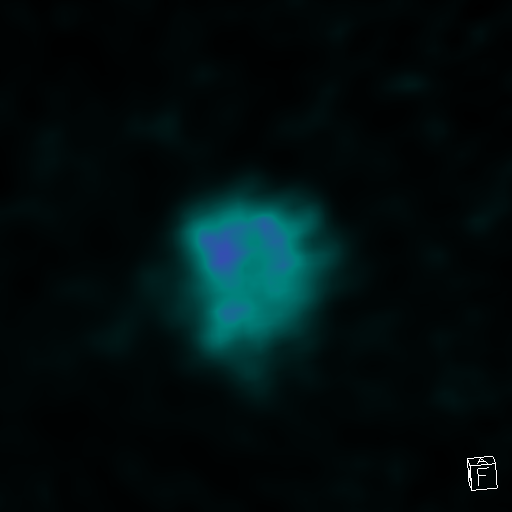
[im 7/43]
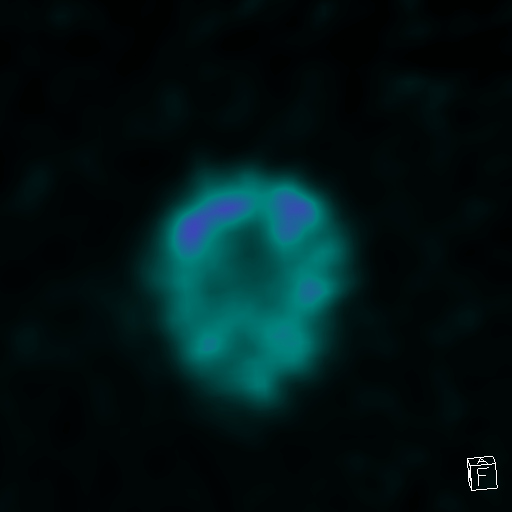
[im 9/43]
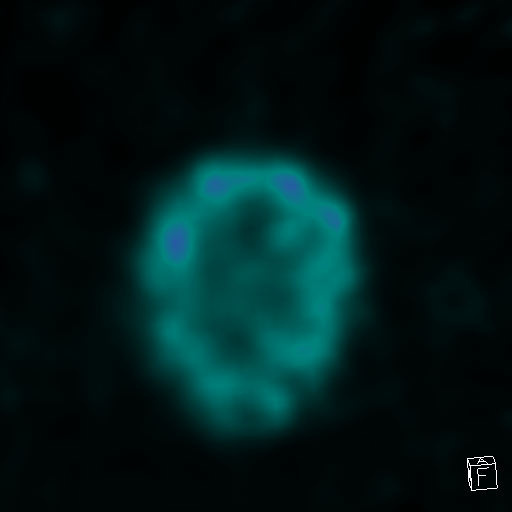
[im 11/43]
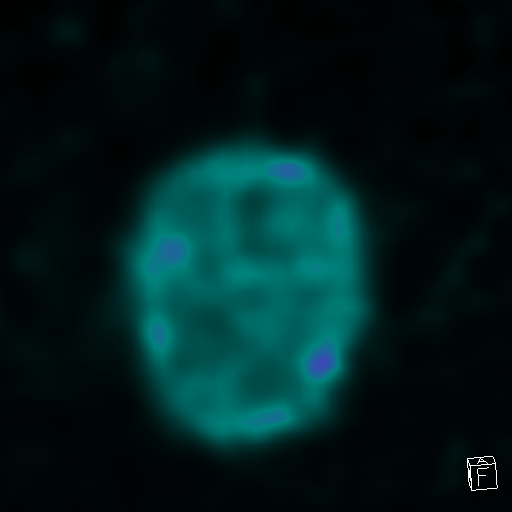
[im 13/43]
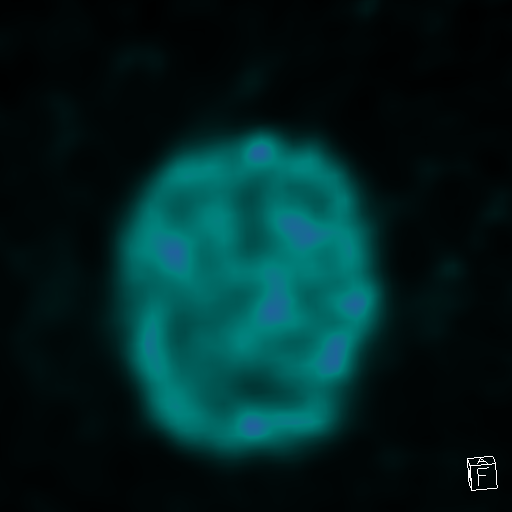
[im 17/43]
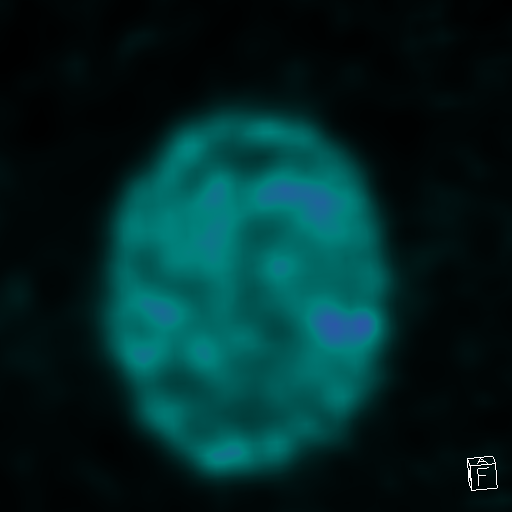
[im 19/43]
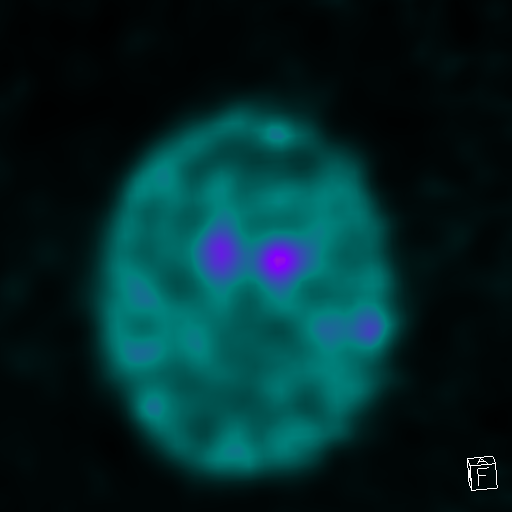
[im 21/43]
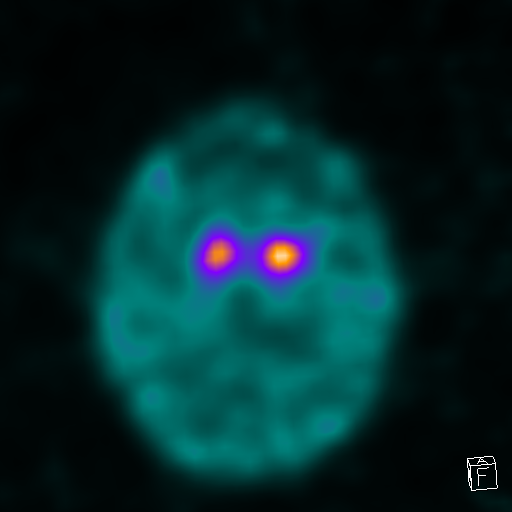
[im 23/43]
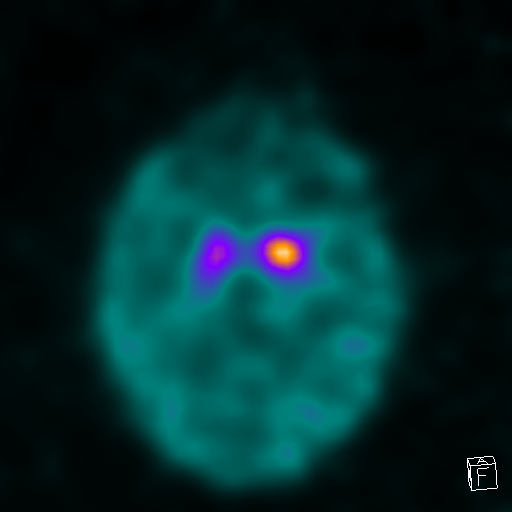
[im 25/43]
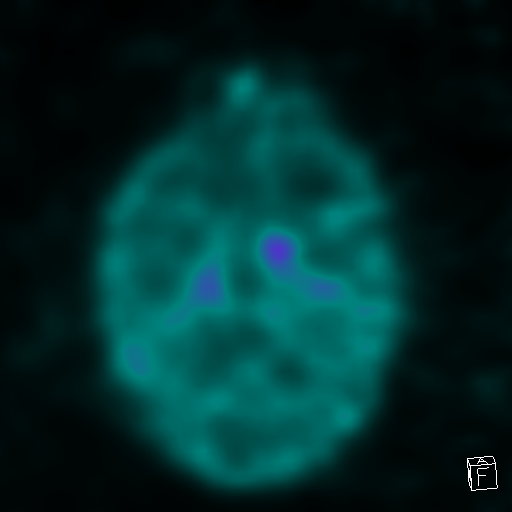
[im 29/43]
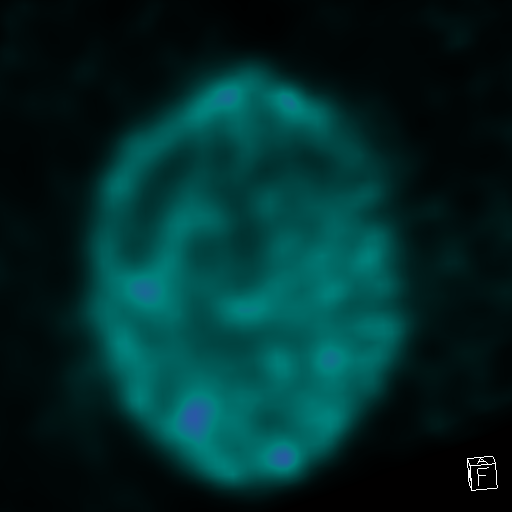
[im 31/43]
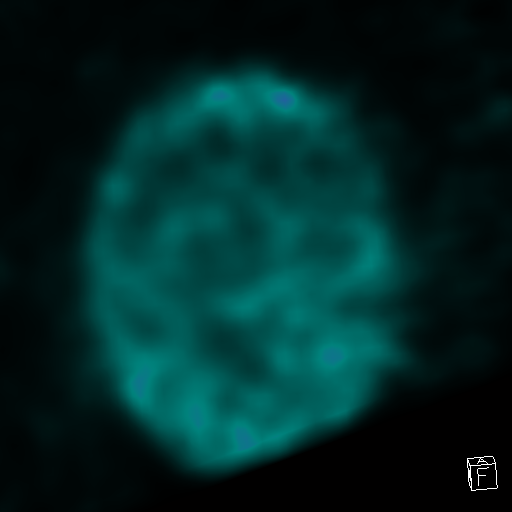
[im 33/43]
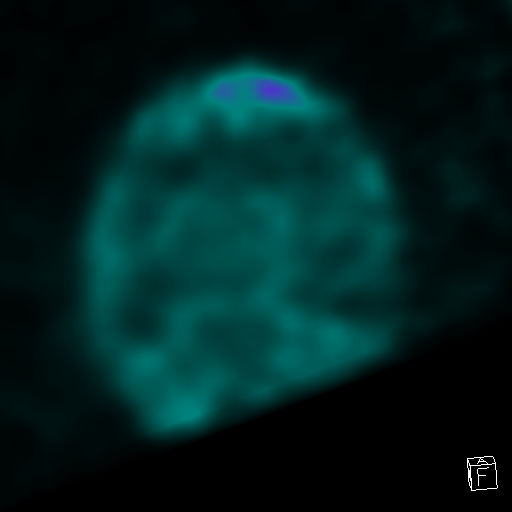
[im 35/43]
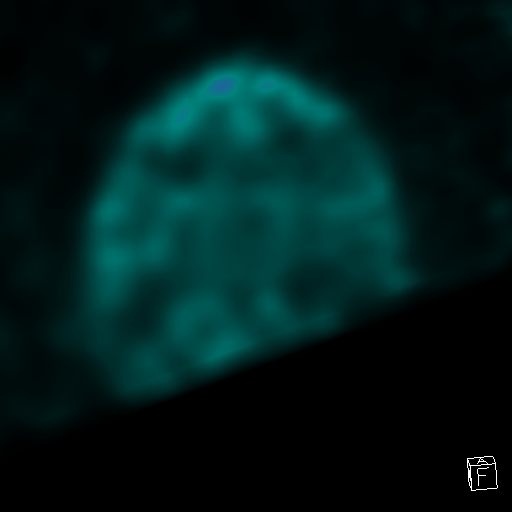
[im 37/43]
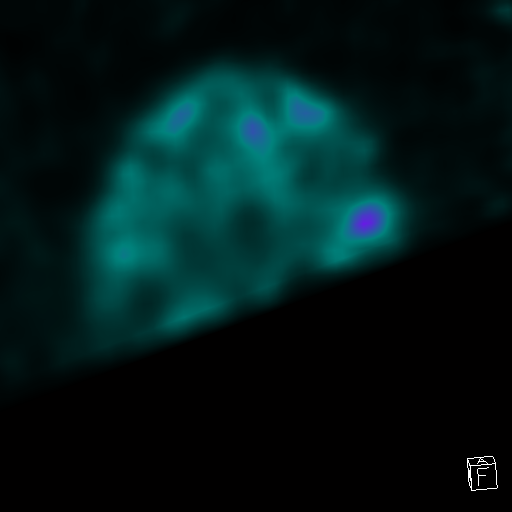
[im 41/43]
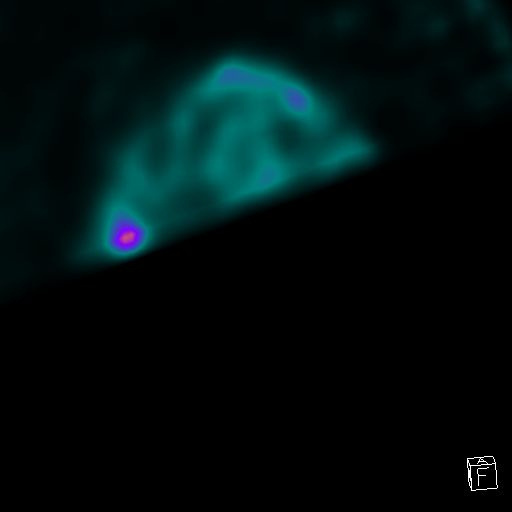
[im 43/43]
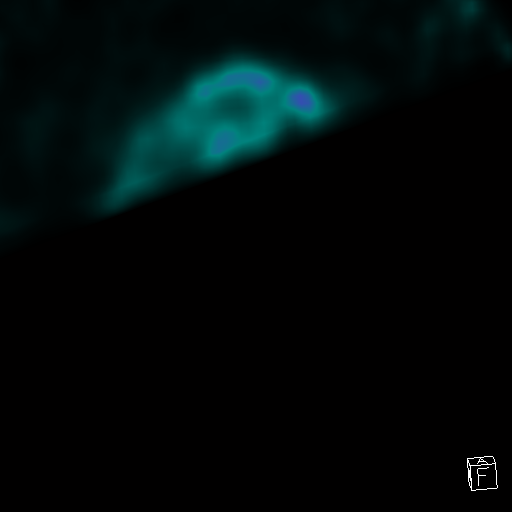

[33 of 40 positions shown; findings below may reference images not displayed]

FINDINGS: There is near absent radiotracer activity within the posterior
striata (putamen) on the LEFT and RIGHT. There is decreased
radiotracer activity within the head of the RIGHT caudate nucleus
compared to the LEFT. Findings are consistent with loss of dopamine
transporter populations within the basal ganglia
IMPRESSION: Near absent activity in the putamen and asymmetric decreased
activity in the head of the RIGHT caudate nucleus is a pattern
suggestive of Parkinson's syndrome pathology.

## 2021-08-26 ENCOUNTER — Other Ambulatory Visit: Payer: Self-pay | Admitting: Neurology

## 2021-08-26 DIAGNOSIS — G20 Parkinson's disease: Secondary | ICD-10-CM

## 2021-08-29 ENCOUNTER — Other Ambulatory Visit: Payer: Self-pay | Admitting: Medical

## 2021-08-29 NOTE — Telephone Encounter (Addendum)
Requesting: ativan Contract:11/11/2020 UDS:11/11/2020 Last Visit:03/22/2021 Next Visit:n/a Last Refill:04/11/21  Please Advise   Rx ativan sent to pt pharmacy. Please get pt scheduled for follow up in august/ before the 7th. That will be 6 month mark.  Esperanza Richters, PA-C

## 2021-08-31 ENCOUNTER — Other Ambulatory Visit: Payer: Self-pay | Admitting: Neurology

## 2021-08-31 DIAGNOSIS — G20 Parkinson's disease: Secondary | ICD-10-CM

## 2021-09-02 ENCOUNTER — Other Ambulatory Visit: Payer: Self-pay | Admitting: Neurology

## 2021-09-14 ENCOUNTER — Encounter: Payer: Self-pay | Admitting: Neurology

## 2021-09-14 DIAGNOSIS — G20 Parkinson's disease: Secondary | ICD-10-CM

## 2021-09-15 ENCOUNTER — Telehealth: Payer: Self-pay | Admitting: Neurology

## 2021-09-15 NOTE — Telephone Encounter (Signed)
Referral sent to [facility] in [facility] [phone]

## 2021-09-22 ENCOUNTER — Encounter: Payer: Self-pay | Admitting: Neurology

## 2021-09-26 NOTE — Telephone Encounter (Signed)
Sent to [facility] in [place] per patient

## 2021-10-03 ENCOUNTER — Encounter: Payer: Self-pay | Admitting: Neurology

## 2021-10-03 MED ORDER — GABAPENTIN 100 MG PO CAPS: 200.0000 mg | ORAL_CAPSULE | Freq: Every day | ORAL | 3 refills | Status: DC

## 2021-10-03 NOTE — Telephone Encounter (Signed)
Increased gabapentin to 200 mg nightly.  New Rx sent to pharmacy.

## 2021-10-13 ENCOUNTER — Other Ambulatory Visit: Payer: Self-pay | Admitting: Medical

## 2021-10-14 NOTE — Telephone Encounter (Signed)
Requesting: lorazepam 0.5mg   Contract: 11/11/20 UDS: 11/11/20 Last Visit: 03/22/21 Next Visit: None Last Refill: 08/29/21 #60 and 0RF  Please Advise   Rx refill sent. Please get pt scheuled for follow up controlled med visit this month.  Esperanza Richters, PA-C

## 2021-10-20 ENCOUNTER — Encounter: Payer: Self-pay | Admitting: Neurology

## 2021-10-20 ENCOUNTER — Ambulatory Visit: Payer: 59 | Admitting: Neurology

## 2021-10-20 VITALS — BP 115/67 | HR 73 | Ht 72.0 in | Wt 168.2 lb

## 2021-10-20 DIAGNOSIS — K5909 Other constipation: Secondary | ICD-10-CM

## 2021-10-20 DIAGNOSIS — G20 Parkinson's disease: Secondary | ICD-10-CM

## 2021-10-20 DIAGNOSIS — R498 Other voice and resonance disorders: Secondary | ICD-10-CM | POA: Diagnosis not present

## 2021-10-20 MED ORDER — ROPINIROLE HCL 1 MG PO TABS: 2.0000 mg | ORAL_TABLET | Freq: Every day | ORAL | 3 refills | Status: DC

## 2021-10-20 MED ORDER — CARBIDOPA-LEVODOPA 25-100 MG PO TABS: 1.0000 | ORAL_TABLET | Freq: Four times a day (QID) | ORAL | 3 refills | Status: DC

## 2021-10-20 MED ORDER — GABAPENTIN 100 MG PO CAPS: 200.0000 mg | ORAL_CAPSULE | Freq: Every day | ORAL | 3 refills | Status: DC

## 2021-10-20 NOTE — Patient Instructions (Signed)
It was very nice to see you both again today.  Overall, I think you are doing very well.  As discussed, I would like for you to increase your levodopa to 1 pill 4 times a day at 8 AM, 12, 4 PM and 8 PM daily.  Continue with ropinirole 2 mg at night and gabapentin 200 mg at night.  I renewed all your prescriptions today.  I will make a referral to speech therapy with neuro rehab.  Try to hydrate well, be really proactive about constipation issues, you can use MiraLAX even every day if needed.  Follow-up in 6 months, sooner if needed.

## 2021-10-20 NOTE — Progress Notes (Signed)
Subjective:    Patient ID: Mark [name] is a 61 y.o. male.  HPI    Interim history:   Mark [name] is a 61 year old right-handed gentleman with an underlying medical history of neck pain, reflux disease, chronic tinnitus, anxiety, and allergic rhinitis, who presents for follow-up consultation of his left-sided predominant Parkinson's disease with akinetic-rigid presentation.  The patient is accompanied by his wife today. I last saw him on 04/14/2021, at which time he was advised to continue with generic Sinemet 1 pill 3 times daily, and amantadine 1 pill twice daily, ropinirole 2 mg at bedtime and Lexapro 10 mg in the morning.  He was advised to start a trial of gabapentin for paresthesias, 300 mg at bedtime.  He emailed in the interim in April 2023 reporting that he was too drowsy from the gabapentin at 300 mg strength and we switched it back to 100 mg strength.  We increased it in the interim in August to 200 mg daily.  He requested a referral in the interim to physical therapy outpatient.  Today, 10/20/2021: He reports overall doing well, sometimes his stiffness is worse, physical exhaustion is a problem, feels really exhausted towards the end of the day.  Mornings are better.  His speech is softer, sometimes it is hard to understand him per wife's report.  He has been taking gabapentin 200 mg at night, feels that it is helping his sleep and the ropinirole is helping the restless legs.  Taking the levodopa helps his symptoms overall, he has no major side effects, tries to stay active and well-hydrated.  He did take a fall about a month ago when he was trying to go upstairs with a heavy suitcase.  He was driving the suitcase but then fell down some steps, bumped his lower back against the wall, no major injuries thankfully.  Constipation is an issue from time to time, does not actually take anything, has a bowel movement about every other day on average.   The patient's allergies, current medications,  family history, past medical history, past social history, past surgical history and problem list were reviewed and updated as appropriate.    Previously   I saw him on 08/18/2020, at which time he felt fairly stable but his wife felt that he had become slower.  He had limited his driving to mostly just going to work.  He is taking ropinirole 3 times a day and exercising regularly.  He was on low-dose Lexapro 5 mg strength and on a slightly reduced dose of Ativan per PCP.  He was advised to start Sinemet at the time.  We talked about a referral to Dr. Linus Mako for DBS evaluation.   He had an appointment with Dr. Linus Mako at [facility] and I reviewed the office note from 12/15/2020.  He was felt to be a good candidate for DBS but since he was doing fairly well on medication regimen, further evaluation was held for surgical treatment of his Parkinson's disease.  He was advised to start amantadine 100 mg twice daily.     We had several email interactions in the interim.  In October 2022 we increased his Lexapro from 5 mg daily to 10 mg daily.  He also reported in the interim that he had more sleepiness from the ropinirole and had reduced it from 1 mg 3 times daily to 2 mg at bedtime only.     In February 2023, he reported having difficulty sleeping.  He was advised to take amantadine  in the morning and midday and try melatonin at night.    I saw him on 02/19/2020, at which time he felt fairly well.  He had completed therapy through neuro rehab and had benefited from therapy.  He did not tolerate the once daily long-acting ropinirole.  He was on Lexapro 5 mg once daily with good tolerance.  He was on ropinirole 1 mg strength 1-1/2 pills 3 times a day.  He felt it was helpful.  He was complaining of fatigue and exhaustion.  He is trying to stay active physically.  He was advised to gradually increase the ropinirole to eventually take 2 pills 3 times daily.       He missed an appointment on 02/10/20. I saw  him on 11/12/2019, at which time he reported feeling better with regards to his Covid symptoms.  We mutually agreed to taper his Neupro patch, as he did not have much in the way of benefit from it.  For anxiety, he was advised to start Lexapro generic low-dose 5 mg strength once daily.  He was advised to start ropinirole immediate release.  He emailed back with the side effects on the ropinirole particularly worsening dizziness and sleepiness.  He was advised to change from immediate release ropinirole to long-acting ropinirole once daily 6 mg strength.     I saw him on 05/22/2019, at which time he reported that his dizziness had improved after he reduced the Neupro back to 6 mg daily from 8 mg daily.  He has had intermittent issues with anxiety.  Back stiffness was fairly stable, Flexeril as needed was helpful.  His wife was worried about his extensive exercise and that he would tire himself out.  We talked about moderation when he came to work schedule and exercising.  He reported some tingling on the left side and feeling a cold sensation in the left arm and hand intermittently.  He did not have any significant constipation or sleep issues.  He and his wife were worried about his EEG.  He had tried Viagra but had side effects.  He had a prescription for Cialis but had not tried it.  I encouraged him to consider seeing a urologist and talk to his primary care provider about a referral, if the Cialis was not helpful. He was advised to continue with the Neupro patch 6 mg once daily.       I saw him on 01/21/2019, at which time he reported feeling better with regards to his restless leg symptoms.  He was taking melatonin at night, 5 mg strength.  He was noticing more stiffness and slowness.  I suggested we increase his Neupro to 8 mg daily.  He also reported back stiffness and back pain in the mid back area, was taking ibuprofen as needed and I suggested a trial of Flexeril.  He emailed in the interim in January  2021, reporting some bouts of constipation.  He and his wife also emailed through [employer] last month reporting that he felt dizzy and lightheaded.  Eventually, we decided to reduce his Neupro patch back to 6 mg daily.     I saw him on 10/01/2018, at which time he reported feeling better after starting his medication.  He also was not using his Ativan as much.  He was working full-time, very active overall, walking a lot.  He had noticed some intermittent sensory symptoms, nothing sustained.  He was advised to continue with the Neupro patch 6 mg strength.  He emailed in the interim with increase in restless leg symptoms, he was advised to try gabapentin in September 2020.     I saw him on 07/02/2018 in virtual visit, at which time we mutually agreed to start him on Neupro patch.  We increased it in the recent past to 6 mg daily.  He emailed recently regarding some swelling he had noticed in the left upper and lower extremities.  He had an ultrasound of the left upper extremity which was negative for a blood clot.       I first met him on 04/03/2018 at the request of Dr. Barbaraann Barthel, at which time the patient reported a several month history, probably a 23-monthhistory of left hand tremor, neck stiffness, difficulty with fine motor control with the left upper extremity, and hand stiffness.  His history and physical examination were in keeping with parkinsonism on the left side.  We talked about symptomatic treatment options to be utilized soon, I suggested we proceed with a nuclear medicine DaTscan.  Due to the COVID-19 pandemic, his scan has not yet been done, he was approved for it though.   The patient sent a MyChart message in the interim in late March 2020 indicating that he has noticed more symptoms including some difficulty with his speech, including slurring, increased salivation, causing drooling at times and some symptoms in his right hand.     04/03/2018: (He) reports a left hand tremor for the  past several months, likely 6-9 months. In addition, he has noted difficulty with fine motor control, he has noticed neck stiffness and left hand stiffness and difficulty performing fine motor skills with the left hand. I reviewed your office note from 01/17/2018. He has had radiating neck pain to the left arm. He has had physical therapy for this. His neck pain has improved. He fell in 2017 and had neck pain. He had a X ray neck on 03/26/17: IMPRESSION: No acute findings.   Mild spondylosis of the cervical spine with moderate to severe multilevel bilateral neural foraminal narrowing as described.   He denies a family history of Parkinson's disease or tremor. He denies any recent falls. He denies any constipation. Mood is fairly stable but he does admit to having a longer standing history of anxiety. He takes Ativan as needed for this. He also reports claustrophobia and having had difficulty going through an MRI in the past.   He has had some issues with becoming slower in his mobility. His brother recently commented on this. He is the youngest of 4 siblings, he has an older brother, then his sister, then another brother. He lives with his family which includes his wife and 139year old daughter and 162year old son. He works as a sArt gallery manager He is a nonsmoker and drinks alcohol in the form of wine, maybe 3 glasses per week on average, caffeine in the form of coffee, 3 cups per day on average. He tries to hydrate well. He exercises regularly.  His Past Medical History Is Significant For: Past Medical History:  Diagnosis Date   Family history of prostate cancer     DRE WNL; PSA never > 0.38   Fasting hyperglycemia     PMH of   GERD (gastroesophageal reflux disease)    Parkinson's disease (HCC)    Tinnitus    w/o hearing loss    His Past Surgical History Is Significant For: Past Surgical History:  Procedure Laterality Date   COLONOSCOPY  05/2013   Neg, Dr  [place] Right     TONSILLECTOMY AND ADENOIDECTOMY     VASECTOMY      His Family History Is Significant For: Family History  Problem Relation Age of Onset   COPD Mother        smoker   Prostate cancer Father 49       no longer with prostate cancer   Breast cancer Sister    COPD Paternal Aunt        smoker   Stroke Maternal Grandfather         late 63s   Heart disease Neg Hx    Colon cancer Neg Hx    Esophageal cancer Neg Hx    Pancreatic cancer Neg Hx    Rectal cancer Neg Hx    Stomach cancer Neg Hx    Diabetes Neg Hx    Other Neg Hx        low testosterone   Parkinson's disease Neg Hx     His Social History Is Significant For: Social History   Socioeconomic History   Marital status: Married    Spouse name: Not on file   Number of children: Not on file   Years of education: Not on file   Highest education level: Not on file  Occupational History   Not on file  Tobacco Use   Smoking status: Never   Smokeless tobacco: Never  Substance and Sexual Activity   Alcohol use: Yes    Comment: very rare wine , < one per month per pt.   Drug use: No   Sexual activity: Yes  Other Topics Concern   Not on file  Social History Narrative   Not on file   Social Determinants of Health   Financial Resource Strain: Not on file  Food Insecurity: Not on file  Transportation Needs: Not on file  Physical Activity: Not on file  Stress: Not on file  Social Connections: Not on file    His Allergies Are:  Allergies  Allergen Reactions   Penicillins     ? Reaction @ age 5  :   His Current Medications Are:  Outpatient Encounter Medications as of 10/20/2021  Medication Sig   benzonatate (TESSALON) 100 MG capsule Take 1 capsule (100 mg total) by mouth 3 (three) times daily as needed for cough.   carbidopa-levodopa (SINEMET IR) 25-100 MG tablet TAKE ONE TABLET BY MOUTH THREE TIMES A DAY   doxycycline (VIBRA-TABS) 100 MG tablet Take 1 tablet (100 mg total) by mouth 2 (two) times daily.    escitalopram (LEXAPRO) 10 MG tablet Take 1 tablet (10 mg total) by mouth in the morning.   fluticasone (FLONASE) 50 MCG/ACT nasal spray Place 2 sprays into both nostrils daily.   gabapentin (NEURONTIN) 100 MG capsule Take 2 capsules (200 mg total) by mouth at bedtime.   LORazepam (ATIVAN) 0.5 MG tablet TAKE ONE TABLET BY MOUTH TWICE A DAY AS NEEDED FOR ANXIETY   Multiple Vitamins-Minerals (MENS MULTIVITAMIN PLUS) TABS Take by mouth daily.   promethazine-dextromethorphan (PROMETHAZINE-DM) 6.25-15 MG/5ML syrup Take 5 mLs by mouth 4 (four) times daily as needed.   rOPINIRole (REQUIP) 1 MG tablet TAKE TWO TABLETS BY MOUTH EVERY NIGHT AT BEDTIME AS NEEDED   tadalafil (CIALIS) 20 MG tablet Take 0.5-1 tablets (10-20 mg total) by mouth every other day as needed for erectile dysfunction.   No facility-administered encounter medications on file as of 10/20/2021.  :  Review of Systems:  Out of a complete  14 point review of systems, all are reviewed and negative with the exception of these symptoms as listed below:  Review of Systems  Neurological:        Pt here for Parkinson's f/u  Pt wants to discuss medication management  Pt states left hand is stiff    Objective:  Neurological Exam  Physical Exam Physical Examination:   Vitals:   10/20/21 1316  BP: 115/67  Pulse: 73    General Examination: The patient is a very pleasant 61 y.o. male in no acute distress. He appears well-developed and well-nourished and well groomed.   HEENT: Normocephalic, atraumatic, pupils are equal, round and reactive to light and accommodation. He has moderate facial masking and decreased eye blink rate.  Tracking mildly slow but no nystagmus.  Hearing is grossly intact. Face is otherwise symmetric, no dysarthria, mild to moderate hypophonia. He has moderate nuchal rigidity. No carotid bruits. Airway examination reveals no significant mouth dryness, otherwise tongue protrudes centrally and palate elevates symmetrically,  and no lip, neck or jaw tremor. No sialorrhea.   Chest: Clear to auscultation without wheezing, rhonchi or crackles noted.   Heart: S1+S2+0, regular and normal without murmurs, rubs or gallops noted.   Abdomen: Soft, non-tender and non-distended.   Extremities: There is no pitting edema in the distal lower extremities bilaterally.   Skin: Warm and dry without trophic changes noted.   Musculoskeletal: exam reveals increase in stiffness in the left upper extremity with trigger finger left ring finger.  Supportive bandage around ring finger.  Neurologically: Mental status: The patient is awake, alert and oriented in all 4 spheres. His immediate and remote memory, attention, language skills and fund of knowledge are appropriate. There is evidence of bradyphrenia.    (On 04/03/2018: on Archimedes spiral drawing he has no significant trembling with either hand, handwriting with the right hand is legible, not tremulous, slightly micrographic.)   He has no obvious resting tremor, no significant postural or action tremor. Cranial nerves II - XII are as described above under HEENT exam. At baseline, mildly elevated right shoulder noted, unchanged.  Motor exam: Normal bulk, and strength, tone is increased in both upper extremities, more noticeable on the left upper extremity compared to the right.  He has no obvious resting tremor, slight postural tremor, fine motor skills are moderately impaired on the left side, better on the right. Cerebellar testing: No dysmetria or intention tremor. There is no truncal or gait ataxia. Sensory exam: intact to light touch in the upper and lower extremities. Gait, station and balance: He stands without difficulty, posture is mild to moderately stooped for age. Gait shows fairly good pace and stride length but near-absence of arm swing on the left, decreased arm swing on the right. Balance appears preserved.   Assessment and plan:    In summary, Damont Balles is a  very pleasant 61 year old male with an underlying medical history of neck pain, reflux disease, chronic tinnitus, anxiety, and allergic rhinitis, who presents for FU consultation of his L sided Parkinson's disease. His DaT scan from 08/07/18 showed near absent activity in the putamen and asymmetric decreased activity in the head of the RIGHT caudate nucleus. He was on Neupro, which we started in May 2020, we increased this to 8 mg strength once daily in December 2020 but he started having lightheadedness and more dizziness.  He works full-time and has to walk and stand a lot at work.  He also exercises on a regular basis.  He  had Covid infection in July 2021. He was partially vaccinated in April 2021. We eventually decided to stop the Neupro and started immediate release Requip. We tried him on long-acting ropinirole in the interim which he did not tolerate as well.  He is currently on ropinirole 2 mg at night for restless leg symptoms.  Melatonin did not help his sleep.  He has had intermittent paresthesias on the left side.  He tried gabapentin briefly in September 2020, I suggested we retry gabapentin at 300 mg at bedtime.  But he felt sedated on that dose, he is now using gabapentin 200 mg at bedtime.  He is advised to increase his Sinemet from 1 pill 3 times daily to 1 pill 4 times daily at 8, 12, 4 PM and 8 PM daily.  I reminded him to take the medication away from his mealtimes. He was started on amantadine by Dr. Linus Mako.  He is advised to continue with his other medications, we will increase his Sinemet to 1 pill 4 times a day.  He is advised to stay well-hydrated and well rested.  I placed a referral to speech therapy and neuro rehab as he has had more difficulty.  He is advised to follow-up in this clinic in 6 months routinely, sooner if needed.  I answered all the questions today and the patient and his wife are in agreement.  I spent 40 minutes in total face-to-face time and in reviewing records during  pre-charting, more than 50% of which was spent in counseling and coordination of care, reviewing test results, reviewing medications and treatment regimen and/or in discussing or reviewing the diagnosis of PD, the prognosis and treatment options. Pertinent laboratory and imaging test results that were available during this visit with the patient were reviewed by me and considered in my medical decision making (see chart for details).

## 2021-11-23 ENCOUNTER — Encounter: Payer: Self-pay | Admitting: Speech Pathology

## 2021-11-23 ENCOUNTER — Ambulatory Visit: Payer: 59 | Attending: Neurology | Admitting: Speech Pathology

## 2021-11-23 ENCOUNTER — Other Ambulatory Visit: Payer: Self-pay | Admitting: Medical

## 2021-11-23 DIAGNOSIS — R471 Dysarthria and anarthria: Secondary | ICD-10-CM | POA: Insufficient documentation

## 2021-11-23 DIAGNOSIS — R1312 Dysphagia, oropharyngeal phase: Secondary | ICD-10-CM | POA: Insufficient documentation

## 2021-11-23 DIAGNOSIS — R1310 Dysphagia, unspecified: Secondary | ICD-10-CM | POA: Insufficient documentation

## 2021-11-23 NOTE — Telephone Encounter (Signed)
Requesting: ativan Contract:11/11/20 UDS:11/11/20 Last Visit:03/22/21 Next Visit:n/a Last Refill:10/15/21  Please Advise

## 2021-11-23 NOTE — Telephone Encounter (Signed)
Pt called and lvm to return call to schedule an appt 

## 2021-11-23 NOTE — Therapy (Signed)
OUTPATIENT SPEECH LANGUAGE PATHOLOGY PARKINSON'S EVALUATION   Patient Name: Mark [name] MRN: [id number] DOB:1960/09/04, 61 y.o., male Today's Date: 11/24/2021  PCP: Elise Benne REFERRING PROVIDER: Star Age, MD   End of Session - 11/24/21 0754     Visit Number 1    Number of Visits 7    Date for SLP Re-Evaluation 01/04/22    Authorization Type [employer] Medicare    SLP Start Time 1400    SLP Stop Time  1439    SLP Time Calculation (min) 39 min    Activity Tolerance Patient tolerated treatment well             Past Medical History:  Diagnosis Date   Family history of prostate cancer     DRE WNL; PSA never > 0.38   Fasting hyperglycemia     PMH of   GERD (gastroesophageal reflux disease)    Parkinson's disease    Tinnitus    w/o hearing loss   Past Surgical History:  Procedure Laterality Date   COLONOSCOPY  05/2013   Neg, Dr Henrene Pastor   KNEE SURGERY Right    TONSILLECTOMY AND ADENOIDECTOMY     VASECTOMY     Patient Active Problem List   Diagnosis Date Noted   Acute non-recurrent pansinusitis 01/31/2021   Low testosterone 06/10/2019   Neck pain 04/19/2017   Anxiety state 04/28/2015   Wellness examination 03/24/2015   Acute pharyngitis 03/30/2014   Intercostal muscle tear 06/20/2013   Nonspecific abnormal electrocardiogram (ECG) (EKG) 03/08/2011   GERD 11/14/2007   ANXIETY STATE NOS 12/11/2006   Hyperglycemia 09/06/2006    ONSET DATE: 10/20/2021 referral  REFERRING DIAG:  G20 (ICD-10-CM) - Parkinson's disease  R49.8 (ICD-10-CM) - Hypophonia  K59.09 (ICD-10-CM) - Chronic constipation    THERAPY DIAG:  Dysphagia, unspecified type  Dysarthria and anarthria  Rationale for Evaluation and Treatment Rehabilitation  SUBJECTIVE:   SUBJECTIVE STATEMENT: "My voice gets really low" Pt accompanied by: self  PERTINENT HISTORY: PD symptoms began late 2019. Prior course ST, 4 sessions 2021  PAIN:  Are you having pain? No  FALLS: Has patient  fallen in last 6 months?  No  LIVING ENVIRONMENT: Lives with: lives with their family Lives in: House/apartment  PLOF:  Level of assistance: Independent with IADLs Employment: Full-time employment  PATIENT GOALS "improve speech"  OBJECTIVE:   COGNITION: Overall cognitive status: Within functional limits for tasks assessed  MOTOR SPEECH: Overall motor speech: impaired Level of impairment: Conversation Respiration: thoracic breathing Phonation: low vocal intensity Resonance: WFL Articulation: Appears intact Intelligibility: Intelligible Motor planning: Appears intact Motor speech errors: inconsistent Interfering components:  PD Effective technique: slow rate, increased vocal intensity, over articulate, and pacing Comments: Overall, motor speech within gross normal limits during today's evaluation, with rare decrease in vocal intensity to below normal limits of 70 dB. Intelligibility intact, with no requests for repetition.   ORAL MOTOR EXAMINATION Overall status: WFL Comments: slight reduction in ROM noted, appears to still be within gross functional limits  SOCIAL HISTORY: Occupation: Company secretary intake: suboptimal Caffeine/alcohol intake: moderate Daily voice use: excessive  OBJECTIVE VOICE ASSESSMENT: Sustained "ah" maximum phonation time: 12 seconds Sustained "ah" loudness average: 84 dB Conversational loudness average: 76 dB Conversational loudness range: 67-85 dB Voice quality: low vocal intensity and vocal fatigue Comments: pt reporting significant difference between vocal intensity and clarity at home and today's presentation. Pt's report aligns with expected hypokinetic dysarthria in presence of PD.   Completed audio recording of patients baseline  voice without cueing from SLP: No  Pt does report difficulty with swallowing which does warrant further evaluation. [facility] Swallow Protocol administered with pt passing screen. This indicates low  likelihood of aspiration occurring.   PATIENT REPORTED OUTCOME MEASURES (PROM): Deferred d/t time constraints  TODAY'S TREATMENT:  Collaborated with pt to generate goals and establish plan of care. Education initiated on PD and swallow function and hypokinetic dysarthria. Initiated strategy to increase water consumption and improve swallow function when problematic. Pt able to teach back with occasional min-A questioning cues. Pt in agreement with POC, denies questions or further concerns at conclusion of evaluation.   PATIENT EDUCATION: Education details: see above Person educated: Patient Education method: Customer service manager Education comprehension: verbalized understanding, returned demonstration, and needs further education   HOME EXERCISE PROGRAM: Hydration protocol; swallow intentionally if coughing during meal   GOALS: Goals reviewed with patient? Yes  LONG TERM GOALS: Target date: 01/04/2022  Pt will report to drinking 80+ oz water per day with mod-I over 1 week period Baseline:  Goal status: INITIAL  2.  Pt will demonstrate dysarthria compensations during complex language tasks with rare min-A to aid in generalization on strategies to communication at home Baseline:  Goal status: INITIAL  3.  Pt will teach back swallow compensations with mod-I Baseline:  Goal status: INITIAL  4.  Pt will report subjective reduction in instances of coughing during meals over 1 week period through use of compensations Baseline:  Goal status: INITIAL  5.  Pt will report subject reduction in repetition requests from spouse through implementation of intentional speech at home over 1 week period Baseline:  Goal status: INITIAL  ASSESSMENT:  CLINICAL IMPRESSION: Patient is a 61 y.o. M who was seen today for ST evaluation in presence of Parkinson's Disease. Pt presenting with suspected mild dysarthria and mild oropharyngeal dysphagia. Per pt report, is experiencing decrease  in volume and articulatory precision (described by pt as "slurred speech") which becomes more evident as day progresses and is especially problematic at home. Pt is a heavy voice user per his vocation, is reporting vocal fatigue which is impacting overall effectiveness of communication at conclusion of work day. Reporting extra effort required some days at work to maintain clear speech. Pt's motor speech and voice was observed to be within gross normal limits today with regards to volume, motor planning, articulation, and voice quality. Upon questioning, pt is aware of presentation today, tells SLP that when experiencing described dysarthria symptoms voice and speech production is "very, very different. Per chart review, pt has demonstrated hypokinetic dysarthria (mild) and received short course of treatment in 2021. Pt reporting change in swallow function, most notable for occasional coughing with liquids, less so with solids. Denies difficulties with saliva management. SLP administered [employer] Protocol, which pt passed indicating low likelihood of aspiration risk at this time. Suspect that swallow difficulties may be managed with behavioral interventions of intentional swallow at this time, but will monitor over therapy course and determine if further evaluation is necessary based on pt reports and SLP observations. Denies changes in cognition. Short course of ST is indicated to provide education and training on compensatory strategies to maintain speech and swallow function and optimize efficacy of both in presence of PD.   OBJECTIVE IMPAIRMENTS  Objective impairments include dysarthria and dysphagia. These impairments are limiting patient from effectively communicating at home and in community and safety when swallowing.Factors affecting potential to achieve goals and functional outcome are medical prognosis. Patient will benefit  from skilled SLP services to address above impairments and improve overall  function.  REHAB POTENTIAL: Good  PLAN: SLP FREQUENCY: 1x/week  SLP DURATION: 6 weeks  PLANNED INTERVENTIONS: Aspiration precaution training, Pharyngeal strengthening exercises, Cueing hierachy, Internal/external aids, SLP instruction and feedback, Compensatory strategies, and Patient/family education    Su Monks, CCC-SLP 11/24/2021, 7:55 AM

## 2021-11-25 ENCOUNTER — Ambulatory Visit: Payer: 59 | Admitting: Medical

## 2021-11-25 VITALS — BP 100/68 | HR 73 | Temp 98.0°F | Resp 18 | Ht 72.0 in | Wt 170.0 lb

## 2021-11-25 DIAGNOSIS — Z23 Encounter for immunization: Secondary | ICD-10-CM | POA: Diagnosis not present

## 2021-11-25 DIAGNOSIS — Z79899 Other long term (current) drug therapy: Secondary | ICD-10-CM

## 2021-11-25 DIAGNOSIS — F419 Anxiety disorder, unspecified: Secondary | ICD-10-CM

## 2021-11-25 DIAGNOSIS — G2581 Restless legs syndrome: Secondary | ICD-10-CM

## 2021-11-25 MED ORDER — LORAZEPAM 0.5 MG PO TABS: ORAL_TABLET | ORAL | 5 refills | Status: DC

## 2021-11-25 NOTE — Patient Instructions (Signed)
Anxiety well controlled requiring less ativan recently Asking MA to update contact to 45 tabs a month up to twice daily if needed. UDS today as well. Continue lexapro. Rx refill to for total 6 month. But month rx given.   For restless leg if flares again then restart requip.   For parkinsons continue to follow up with neurologist.  Follow up in 6 months.

## 2021-11-25 NOTE — Progress Notes (Signed)
Subjective:    Patient ID: Mark [name], male    DOB: 02/18/1960, 61 y.o.   MRN: [id number]  HPI Anxiety- Controlled with lorazepam. Currently on 1 tab twice a day. Just got refill per pt. He is up to date on uds and controlled med contract.  Pt overdue for uds and ned to update contract  Pt use has varied. Occasionally needing twice a day. He was formerly taking twice a day.  Pt following up with neurologist for parkinsons  Restless legs controlled with requip.   Review of Systems  Constitutional:  Negative for chills, fatigue and fever.  HENT:  Negative for congestion and drooling.   Respiratory:  Negative for cough, chest tightness, shortness of breath and wheezing.   Cardiovascular:  Negative for chest pain and palpitations.  Gastrointestinal:  Negative for abdominal pain, constipation, nausea and vomiting.  Genitourinary:  Negative for flank pain, frequency, penile pain and scrotal swelling.  Musculoskeletal:  Negative for back pain, joint swelling and myalgias.  Skin:  Negative for rash.  Neurological:        Restless legs.  Psychiatric/Behavioral:  The patient is nervous/anxious.     Past Medical History:  Diagnosis Date   Family history of prostate cancer     DRE WNL; PSA never > 0.38   Fasting hyperglycemia     PMH of   GERD (gastroesophageal reflux disease)    Parkinson's disease    Tinnitus    w/o hearing loss     Social History   Socioeconomic History   Marital status: Married    Spouse name: Not on file   Number of children: Not on file   Years of education: Not on file   Highest education level: Not on file  Occupational History   Not on file  Tobacco Use   Smoking status: Never   Smokeless tobacco: Never  Substance and Sexual Activity   Alcohol use: Yes    Comment: very rare wine , < one per month per pt.   Drug use: No   Sexual activity: Yes  Other Topics Concern   Not on file  Social History Narrative   Not on file   Social  Determinants of Health   Financial Resource Strain: Not on file  Food Insecurity: Not on file  Transportation Needs: Not on file  Physical Activity: Not on file  Stress: Not on file  Social Connections: Not on file  Intimate Partner Violence: Not on file    Past Surgical History:  Procedure Laterality Date   COLONOSCOPY  05/2013   Neg, Dr Henrene Pastor   KNEE SURGERY Right    TONSILLECTOMY AND ADENOIDECTOMY     VASECTOMY      Family History  Problem Relation Age of Onset   COPD Mother        smoker   Prostate cancer Father 59       no longer with prostate cancer   Breast cancer Sister    COPD Paternal Aunt        smoker   Stroke Maternal Grandfather         late 74s   Heart disease Neg Hx    Colon cancer Neg Hx    Esophageal cancer Neg Hx    Pancreatic cancer Neg Hx    Rectal cancer Neg Hx    Stomach cancer Neg Hx    Diabetes Neg Hx    Other Neg Hx        low testosterone  Parkinson's disease Neg Hx     Allergies  Allergen Reactions   Penicillins     ? Reaction @ age 69    Current Outpatient Medications on File Prior to Visit  Medication Sig Dispense Refill   carbidopa-levodopa (SINEMET IR) 25-100 MG tablet Take 1 tablet by mouth 4 (four) times daily. Take at 8 AM, 12, 4 PM and 8 PM daily. 360 tablet 3   escitalopram (LEXAPRO) 10 MG tablet Take 1 tablet (10 mg total) by mouth in the morning. 90 tablet 3   fluticasone (FLONASE) 50 MCG/ACT nasal spray Place 2 sprays into both nostrils daily. 16 g 6   gabapentin (NEURONTIN) 100 MG capsule Take 2 capsules (200 mg total) by mouth at bedtime. 180 capsule 3   LORazepam (ATIVAN) 0.5 MG tablet TAKE ONE TABLET BY MOUTH TWICE A DAY AS NEEDED FOR ANXIETY 60 tablet 0   Multiple Vitamins-Minerals (MENS MULTIVITAMIN PLUS) TABS Take by mouth daily.     rOPINIRole (REQUIP) 1 MG tablet Take 2 tablets (2 mg total) by mouth at bedtime. 180 tablet 3   tadalafil (CIALIS) 20 MG tablet Take 0.5-1 tablets (10-20 mg total) by mouth every other  day as needed for erectile dysfunction. 10 tablet 11   No current facility-administered medications on file prior to visit.    BP 100/68   Pulse 73   Temp 98 F (36.7 C)   Resp 18   Ht 6' (1.829 m)   Wt 170 lb (77.1 kg)   SpO2 100%   BMI 23.06 kg/m        Objective:   Physical Exam  General- No acute distress. Pleasant patient. Lungs- Clear, even and unlabored. Heart- regular rate and rhythm. Neurologic- CNII- XII grossly intact.        Assessment & Plan:  Anxiety well controlled requiring less ativan recently Asking MA to update contact to 45 tabs a month up to twice daily if needed. UDS today as well. Continue lexapro. Rx refill to for total 6 month. But month rx given.   For restless leg if flares again then restart requip.   For parkinsons continue to follow up with neurologist.  Follow up in 6 months.  Esperanza Richters, PA-C

## 2021-11-27 LAB — DRUG MONITORING PANEL 376104, URINE
Amphetamines: NEGATIVE ng/mL (ref <500)
Barbiturates: NEGATIVE ng/mL (ref <300)
Benzodiazepines: NEGATIVE ng/mL (ref <100)
Cocaine Metabolite: NEGATIVE ng/mL (ref <150)
Desmethyltramadol: NEGATIVE ng/mL (ref <100)
Opiates: NEGATIVE ng/mL (ref <100)
Oxycodone: NEGATIVE ng/mL (ref <100)
Tramadol: NEGATIVE ng/mL (ref <100)

## 2021-11-27 LAB — DM TEMPLATE

## 2021-11-28 ENCOUNTER — Ambulatory Visit: Payer: 59 | Admitting: Speech Pathology

## 2021-11-28 ENCOUNTER — Telehealth: Payer: Self-pay | Admitting: Speech Pathology

## 2021-11-28 ENCOUNTER — Encounter: Payer: Self-pay | Admitting: Medical

## 2021-11-28 DIAGNOSIS — R471 Dysarthria and anarthria: Secondary | ICD-10-CM

## 2021-11-28 DIAGNOSIS — R1312 Dysphagia, oropharyngeal phase: Secondary | ICD-10-CM

## 2021-11-28 DIAGNOSIS — R1310 Dysphagia, unspecified: Secondary | ICD-10-CM | POA: Diagnosis not present

## 2021-11-28 MED ORDER — LORAZEPAM 0.5 MG PO TABS: ORAL_TABLET | ORAL | 5 refills | Status: DC

## 2021-11-28 NOTE — Addendum Note (Signed)
Addended by: Anabel Halon on: 11/28/2021 01:31 PM   Modules accepted: Orders

## 2021-11-28 NOTE — Therapy (Addendum)
OUTPATIENT SPEECH LANGUAGE PATHOLOGY TREATMENT   Patient Name: Mark [name] MRN: [id number] DOB:1961/01/30, 61 y.o., male Today's Date: 11/28/2021  PCP: Elise Benne REFERRING PROVIDER: Star Age, MD   End of Session - 11/28/21 1400     Visit Number 2    Number of Visits 7    Date for SLP Re-Evaluation 01/04/22    Authorization Type [employer] Medicare    SLP Start Time 1400    SLP Stop Time  1445    SLP Time Calculation (min) 45 min    Activity Tolerance Patient tolerated treatment well             Past Medical History:  Diagnosis Date   Family history of prostate cancer     DRE WNL; PSA never > 0.38   Fasting hyperglycemia     PMH of   GERD (gastroesophageal reflux disease)    Parkinson's disease    Tinnitus    w/o hearing loss   Past Surgical History:  Procedure Laterality Date   COLONOSCOPY  05/2013   Neg, Dr Henrene Pastor   KNEE SURGERY Right    TONSILLECTOMY AND ADENOIDECTOMY     VASECTOMY     Patient Active Problem List   Diagnosis Date Noted   Acute non-recurrent pansinusitis 01/31/2021   Low testosterone 06/10/2019   Neck pain 04/19/2017   Anxiety state 04/28/2015   Wellness examination 03/24/2015   Acute pharyngitis 03/30/2014   Intercostal muscle tear 06/20/2013   Nonspecific abnormal electrocardiogram (ECG) (EKG) 03/08/2011   GERD 11/14/2007   ANXIETY STATE NOS 12/11/2006   Hyperglycemia 09/06/2006    ONSET DATE: 10/20/2021 referral  REFERRING DIAG:  G20 (ICD-10-CM) - Parkinson's disease  R49.8 (ICD-10-CM) - Hypophonia  K59.09 (ICD-10-CM) - Chronic constipation    THERAPY DIAG:  Dysarthria and anarthria  Dysphagia, oropharyngeal phase  Rationale for Evaluation and Treatment Rehabilitation  SUBJECTIVE:   SUBJECTIVE STATEMENT: "It was rough this morning"   PAIN:  Are you having pain? No   OBJECTIVE:   TODAY'S TREATMENT:  11-28-21: Pt reports is having difficulties with L hand, wears brace to avoid finger locking up. Would  benefit from OT evaluation. Pt agreeable, SLP to request referral from referring physician. Denies concerns for physical therapy. Target dysarthria compensation of reduced rate. Initiated training at reading level, with usual mod-A to achieve reduced rate in 8/10 sentences. Target increased complexity in answering questions with pt abl to carryover in 70% of trials with emerging awareness of increased rate. Updated HEP to include oral reading with focus on reduced rate. Education on use of intent when slurring & reduce volume occur. Pt verbalizes understanding.    PATIENT EDUCATION: Education details: see above Person educated: Patient Education method: Customer service manager Education comprehension: verbalized understanding, returned demonstration, and needs further education   HOME EXERCISE PROGRAM: Hydration protocol; swallow intentionally if coughing during meal   GOALS: Goals reviewed with patient? Yes  LONG TERM GOALS: Target date: 01/04/2022  Pt will report to drinking 80+ oz water per day with mod-I over 1 week period Baseline:  Goal status: IN PROGRESS  2.  Pt will demonstrate dysarthria compensations during complex language tasks with rare min-A to aid in generalization on strategies to communication at home Baseline:  Goal status: IN PROGRESS  3.  Pt will teach back swallow compensations with mod-I Baseline:  Goal status: IN PROGRESS  4.  Pt will report subjective reduction in instances of coughing during meals over 1 week period through use of compensations Baseline:  Goal status: IN PROGRESS  5.  Pt will report subject reduction in repetition requests from spouse through implementation of intentional speech at home over 1 week period Baseline:  Goal status: IN PROGRESS  ASSESSMENT:  CLINICAL IMPRESSION: Patient is a 60 y.o. M who was seen today for ST evaluation in presence of Parkinson's Disease. Pt presenting with suspected mild dysarthria and mild  oropharyngeal dysphagia. Per pt report, is experiencing decrease in volume and articulatory precision (described by pt as "slurred speech") which becomes more evident as day progresses and is especially problematic at home. Pt is a heavy voice user per his vocation, is reporting vocal fatigue which is impacting overall effectiveness of communication at conclusion of work day. Reporting extra effort required some days at work to maintain clear speech. Pt's motor speech and voice was observed to be within gross normal limits today with regards to volume, motor planning, articulation, and voice quality. Upon questioning, pt is aware of presentation today, tells SLP that when experiencing described dysarthria symptoms voice and speech production is "very, very different. Per chart review, pt has demonstrated hypokinetic dysarthria (mild) and received short course of treatment in 2021. Pt reporting change in swallow function, most notable for occasional coughing with liquids, less so with solids. Denies difficulties with saliva management. SLP administered [employer] Protocol, which pt passed indicating low likelihood of aspiration risk at this time. Suspect that swallow difficulties may be managed with behavioral interventions of intentional swallow at this time, but will monitor over therapy course and determine if further evaluation is necessary based on pt reports and SLP observations. Denies changes in cognition. Short course of ST is indicated to provide education and training on compensatory strategies to maintain speech and swallow function and optimize efficacy of both in presence of PD.   OBJECTIVE IMPAIRMENTS  Objective impairments include dysarthria and dysphagia. These impairments are limiting patient from effectively communicating at home and in community and safety when swallowing.Factors affecting potential to achieve goals and functional outcome are medical prognosis. Patient will benefit from  skilled SLP services to address above impairments and improve overall function.  REHAB POTENTIAL: Good  PLAN: SLP FREQUENCY: 1x/week  SLP DURATION: 6 weeks  PLANNED INTERVENTIONS: Aspiration precaution training, Pharyngeal strengthening exercises, Cueing hierachy, Internal/external aids, SLP instruction and feedback, Compensatory strategies, and Patient/family education    Su Monks, CCC-SLP 11/28/2021, 2:00 PM

## 2021-11-28 NOTE — Telephone Encounter (Signed)
Mark [name] is being seen at this clinic for [place]. The patient would benefit from an OT evaluation for dystonia in left hand.     If you agree, please place an order in [facility]que in [facility] or fax the order to [phone].   Thank you, Myra Gianotti, [facility]  [address] [place], [place]  [address] Phone:  [phone] Fax:  [phone]

## 2021-11-28 NOTE — Telephone Encounter (Signed)
Spoke with Sharee Pimple at pharmacy and she stated they dont have script , made them aware it was sent on 11/25/2021 and she stated they still dont have the medication on file

## 2021-12-09 ENCOUNTER — Ambulatory Visit: Payer: 59 | Admitting: Speech Pathology

## 2021-12-09 NOTE — Therapy (Deleted)
OUTPATIENT SPEECH LANGUAGE PATHOLOGY TREATMENT   Patient Name: Mark [name] MRN: [id number] DOB:Jul 11, 1960, 61 y.o., male Today's Date: 12/09/2021  PCP: Marisue Brooklyn REFERRING PROVIDER: Huston Foley, MD     Past Medical History:  Diagnosis Date   Family history of prostate cancer     DRE WNL; PSA never > 0.38   Fasting hyperglycemia     PMH of   GERD (gastroesophageal reflux disease)    Parkinson's disease    Tinnitus    w/o hearing loss   Past Surgical History:  Procedure Laterality Date   COLONOSCOPY  05/2013   Neg, Dr Marina Goodell   KNEE SURGERY Right    TONSILLECTOMY AND ADENOIDECTOMY     VASECTOMY     Patient Active Problem List   Diagnosis Date Noted   Acute non-recurrent pansinusitis 01/31/2021   Low testosterone 06/10/2019   Neck pain 04/19/2017   Anxiety state 04/28/2015   Wellness examination 03/24/2015   Acute pharyngitis 03/30/2014   Intercostal muscle tear 06/20/2013   Nonspecific abnormal electrocardiogram (ECG) (EKG) 03/08/2011   GERD 11/14/2007   ANXIETY STATE NOS 12/11/2006   Hyperglycemia 09/06/2006    ONSET DATE: 10/20/2021 referral  REFERRING DIAG:  G20 (ICD-10-CM) - Parkinson's disease  R49.8 (ICD-10-CM) - Hypophonia  K59.09 (ICD-10-CM) - Chronic constipation    THERAPY DIAG:  No diagnosis found.  Rationale for Evaluation and Treatment Rehabilitation  SUBJECTIVE:   SUBJECTIVE STATEMENT: ***  PAIN:  Are you having pain? No   OBJECTIVE:   TODAY'S TREATMENT:  12-09-21: ***  11-28-21: Pt reports is having difficulties with L hand, wears brace to avoid finger locking up. Would benefit from OT evaluation. Pt agreeable, SLP to request referral from referring physician. Denies concerns for physical therapy. Target dysarthria compensation of reduced rate. Initiated training at reading level, with usual mod-A to achieve reduced rate in 8/10 sentences. Target increased complexity in answering questions with pt abl to carryover in  70% of trials with emerging awareness of increased rate. Updated HEP to include oral reading with focus on reduced rate. Education on use of intent when slurring & reduce volume occur. Pt verbalizes understanding.    PATIENT EDUCATION: Education details: see above Person educated: Patient Education method: Medical illustrator Education comprehension: verbalized understanding, returned demonstration, and needs further education   HOME EXERCISE PROGRAM: Hydration protocol; swallow intentionally if coughing during meal   GOALS: Goals reviewed with patient? Yes  LONG TERM GOALS: Target date: 01/04/2022  Pt will report to drinking 80+ oz water per day with mod-I over 1 week period Baseline:  Goal status: IN PROGRESS  2.  Pt will demonstrate dysarthria compensations during complex language tasks with rare min-A to aid in generalization on strategies to communication at home Baseline:  Goal status: IN PROGRESS  3.  Pt will teach back swallow compensations with mod-I Baseline:  Goal status: IN PROGRESS  4.  Pt will report subjective reduction in instances of coughing during meals over 1 week period through use of compensations Baseline:  Goal status: IN PROGRESS  5.  Pt will report subject reduction in repetition requests from spouse through implementation of intentional speech at home over 1 week period Baseline:  Goal status: IN PROGRESS  ASSESSMENT:  CLINICAL IMPRESSION: Patient is a 61 y.o. M who was seen today for ST evaluation in presence of Parkinson's Disease. Pt presenting with suspected mild dysarthria and mild oropharyngeal dysphagia. Per pt report, is experiencing decrease in volume and articulatory precision (described by pt as "slurred  speech") which becomes more evident as day progresses and is especially problematic at home. 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 Reporting extra effort required some days at work to maintain clear speech. Pt's motor speech and voice was observed to be within gross normal limits today with regards to volume, motor planning, articulation, and voice quality. Upon questioning, pt is aware of presentation today, tells SLP that when experiencing described dysarthria symptoms voice and speech production is "very, very different. Per chart review, pt has demonstrated hypokinetic dysarthria (mild) and received short course of treatment in 2021. Pt reporting change in swallow function, most notable for occasional coughing with liquids, less so with solids. Denies difficulties with saliva management. SLP administered [employer] Protocol, which pt passed indicating low likelihood of aspiration risk at this time. Suspect that swallow difficulties may be managed with behavioral interventions of intentional swallow at this time, but will monitor over therapy course and determine if further evaluation is necessary based on pt reports and SLP observations. Denies changes in cognition. Short course of ST is indicated to provide education and training on compensatory strategies to maintain speech and swallow function and optimize efficacy of both in presence of PD.   OBJECTIVE IMPAIRMENTS  Objective impairments include dysarthria and dysphagia. These impairments are limiting patient from effectively communicating at home and in community and safety when swallowing.Factors affecting potential to achieve goals and functional outcome are medical prognosis. Patient will benefit from skilled SLP services to address above impairments and improve overall function.  REHAB POTENTIAL: Good  PLAN: SLP FREQUENCY: 1x/week  SLP DURATION: 6 weeks  PLANNED INTERVENTIONS: Aspiration precaution training, Pharyngeal strengthening exercises, Cueing hierachy, Internal/external aids, SLP instruction and feedback, Compensatory strategies, and  Patient/family education    Su Monks, CCC-SLP 12/09/2021, 12:27 PM

## 2021-12-14 ENCOUNTER — Ambulatory Visit: Payer: 59 | Admitting: Speech Pathology

## 2021-12-21 ENCOUNTER — Ambulatory Visit: Payer: 59 | Attending: Neurology | Admitting: Speech Pathology

## 2021-12-22 ENCOUNTER — Encounter: Payer: Self-pay | Admitting: Speech Pathology

## 2021-12-22 NOTE — Therapy (Signed)
[facility] [address] [place], [place], [address] Phone: [phone]   Fax:  [phone]  Patient Details  Name: Mark [name] MRN: [id number] Date of Birth: Feb 03, 1961 Referring Provider:  No ref. provider found  Encounter Date: 12/22/2021  SPEECH THERAPY DISCHARGE SUMMARY  Visits from Start of Care: 2  Current functional level related to goals / functional outcomes: Unable to determine as pt did not return for subsequent scheduled visits following initial therapy session.    Remaining deficits: Dysarthria, dysphagia    Education / Equipment: Dysarthria compensations; swallowing compensations/strategies  Patient agrees to discharge. Patient goals were not met. Patient is being discharged due to not returning since the last visit.  GOALS: Goals reviewed with patient? Yes   LONG TERM GOALS: Target date: 01/04/2022   Pt will report to drinking 80+ oz water per day with mod-I over 1 week period Baseline:  Goal status: not met   2.  Pt will demonstrate dysarthria compensations during complex language tasks with rare min-A to aid in generalization on strategies to communication at home Baseline:  Goal status: not met   3.  Pt will teach back swallow compensations with mod-I Baseline:  Goal status: not met   4.  Pt will report subjective reduction in instances of coughing during meals over 1 week period through use of compensations Baseline:  Goal status: not met   5.  Pt will report subject reduction in repetition requests from spouse through implementation of intentional speech at home over 1 week period Baseline:  Goal status: not met   Su Monks, CCC-SLP 12/22/2021, 11:33 AM  [facility] [address] [place], [place], [address] Phone: [phone]   Fax:  [phone]

## 2022-01-03 ENCOUNTER — Encounter: Payer: Self-pay | Admitting: Neurology

## 2022-01-11 ENCOUNTER — Encounter: Payer: Self-pay | Admitting: Neurology

## 2022-01-11 MED ORDER — GABAPENTIN 300 MG PO CAPS: 300.0000 mg | ORAL_CAPSULE | Freq: Every day | ORAL | 5 refills | Status: DC

## 2022-01-11 NOTE — Telephone Encounter (Signed)
Rx for gabapentin 300 mg nightly sent to [facility] pharmacy on file.

## 2022-01-12 NOTE — Telephone Encounter (Addendum)
Noted. Receipt confirmed by pharmacy.

## 2022-03-23 ENCOUNTER — Encounter: Payer: Self-pay | Admitting: Neurology

## 2022-04-06 ENCOUNTER — Other Ambulatory Visit: Payer: Self-pay | Admitting: Neurology

## 2022-04-20 ENCOUNTER — Ambulatory Visit: Payer: 59 | Admitting: Neurology

## 2022-04-20 ENCOUNTER — Encounter: Payer: Self-pay | Admitting: Neurology

## 2022-04-20 VITALS — BP 107/65 | HR 70 | Ht 72.0 in | Wt 171.2 lb

## 2022-04-20 DIAGNOSIS — R498 Other voice and resonance disorders: Secondary | ICD-10-CM

## 2022-04-20 DIAGNOSIS — G20A2 Parkinson's disease without dyskinesia, with fluctuations: Secondary | ICD-10-CM | POA: Diagnosis not present

## 2022-04-20 DIAGNOSIS — F419 Anxiety disorder, unspecified: Secondary | ICD-10-CM

## 2022-04-20 DIAGNOSIS — K5909 Other constipation: Secondary | ICD-10-CM

## 2022-04-20 DIAGNOSIS — G2581 Restless legs syndrome: Secondary | ICD-10-CM

## 2022-04-20 DIAGNOSIS — M65342 Trigger finger, left ring finger: Secondary | ICD-10-CM

## 2022-04-20 DIAGNOSIS — G479 Sleep disorder, unspecified: Secondary | ICD-10-CM

## 2022-04-20 MED ORDER — ROPINIROLE HCL 3 MG PO TABS: 3.0000 mg | ORAL_TABLET | Freq: Every day | ORAL | 3 refills | Status: DC

## 2022-04-20 NOTE — Progress Notes (Signed)
Subjective:    Patient ID: Mark [name] is a 62 y.o. male.  HPI    Interim history:     Mark [name] is a 62 year old right-handed gentleman with an underlying medical history of neck pain, reflux disease, chronic tinnitus, anxiety, and allergic rhinitis, who presents for follow-up consultation of his left-sided predominant Parkinson's disease with akinetic-rigid presentation.  The patient is accompanied by his wife today. I last saw him on 10/20/2021, at which time he reported feeling quite well.  He felt that the ropinirole was helping his restless legs.    Today, 04/20/2022: He reports feeling well.  His wife has noticed worsening of his speech difficulty first thing in the morning, he tends to mumble.  He has not fallen recently.  He exercises regularly and tries to stay active.  Constipation is under reasonable control with as needed use of MiraLAX.  He has increased his ropinirole a couple weeks ago to 3 pills at night which helps his leg twitching and restless leg symptoms.  He increased the ropinirole on his own.  Anxiety is under good control, he takes Lexapro daily and Ativan as needed.  He saw orthopedics for his trigger finger.  He tried a splint but no other intervention was done.  The patient's allergies, current medications, family history, past medical history, past social history, past surgical history and problem list were reviewed and updated as appropriate.    Previously   I saw him on 04/14/2021, at which time he was advised to continue with generic Sinemet 1 pill 3 times daily, and amantadine 1 pill twice daily, ropinirole 2 mg at bedtime and Lexapro 10 mg in the morning.  He was advised to start a trial of gabapentin for paresthesias, 300 mg at bedtime.  He emailed in the interim in April 2023 reporting that he was too drowsy from the gabapentin at 300 mg strength and we switched it back to 100 mg strength.  We increased it in the interim in August to 200 mg daily.  He requested  a referral in the interim to physical therapy outpatient.     I saw him on 08/18/2020, at which time he felt fairly stable but his wife felt that he had become slower.  He had limited his driving to mostly just going to work.  He is taking ropinirole 3 times a day and exercising regularly.  He was on low-dose Lexapro 5 mg strength and on a slightly reduced dose of Ativan per PCP.  He was advised to start Sinemet at the time.  We talked about a referral to Dr. Linus Mako for DBS evaluation.   He had an appointment with Dr. Linus Mako at [facility] and I reviewed the office note from 12/15/2020.  He was felt to be a good candidate for DBS but since he was doing fairly well on medication regimen, further evaluation was held for surgical treatment of his Parkinson's disease.  He was advised to start amantadine 100 mg twice daily.     We had several email interactions in the interim.  In October 2022 we increased his Lexapro from 5 mg daily to 10 mg daily.  He also reported in the interim that he had more sleepiness from the ropinirole and had reduced it from 1 mg 3 times daily to 2 mg at bedtime only.     In February 2023, he reported having difficulty sleeping.  He was advised to take amantadine in the morning and midday and try melatonin at night.  I saw him on 02/19/2020, at which time he felt fairly well.  He had completed therapy through neuro rehab and had benefited from therapy.  He did not tolerate the once daily long-acting ropinirole.  He was on Lexapro 5 mg once daily with good tolerance.  He was on ropinirole 1 mg strength 1-1/2 pills 3 times a day.  He felt it was helpful.  He was complaining of fatigue and exhaustion.  He is trying to stay active physically.  He was advised to gradually increase the ropinirole to eventually take 2 pills 3 times daily.       He missed an appointment on 02/10/20. I saw him on 11/12/2019, at which time he reported feeling better with regards to his Covid symptoms.  We  mutually agreed to taper his Neupro patch, as he did not have much in the way of benefit from it.  For anxiety, he was advised to start Lexapro generic low-dose 5 mg strength once daily.  He was advised to start ropinirole immediate release.  He emailed back with the side effects on the ropinirole particularly worsening dizziness and sleepiness.  He was advised to change from immediate release ropinirole to long-acting ropinirole once daily 6 mg strength.     I saw him on 05/22/2019, at which time he reported that his dizziness had improved after he reduced the Neupro back to 6 mg daily from 8 mg daily.  He has had intermittent issues with anxiety.  Back stiffness was fairly stable, Flexeril as needed was helpful.  His wife was worried about his extensive exercise and that he would tire himself out.  We talked about moderation when he came to work schedule and exercising.  He reported some tingling on the left side and feeling a cold sensation in the left arm and hand intermittently.  He did not have any significant constipation or sleep issues.  He and his wife were worried about his EEG.  He had tried Viagra but had side effects.  He had a prescription for Cialis but had not tried it.  I encouraged him to consider seeing a urologist and talk to his primary care provider about a referral, if the Cialis was not helpful. He was advised to continue with the Neupro patch 6 mg once daily.       I saw him on 01/21/2019, at which time he reported feeling better with regards to his restless leg symptoms.  He was taking melatonin at night, 5 mg strength.  He was noticing more stiffness and slowness.  I suggested we increase his Neupro to 8 mg daily.  He also reported back stiffness and back pain in the mid back area, was taking ibuprofen as needed and I suggested a trial of Flexeril.  He emailed in the interim in January 2021, reporting some bouts of constipation.  He and his wife also emailed through [employer] last month  reporting that he felt dizzy and lightheaded.  Eventually, we decided to reduce his Neupro patch back to 6 mg daily.     I saw him on 10/01/2018, at which time he reported feeling better after starting his medication.  He also was not using his Ativan as much.  He was working full-time, very active overall, walking a lot.  He had noticed some intermittent sensory symptoms, nothing sustained.  He was advised to continue with the Neupro patch 6 mg strength.   He emailed in the interim with increase in restless leg symptoms, he was  advised to try gabapentin in September 2020.     I saw him on 07/02/2018 in virtual visit, at which time we mutually agreed to start him on Neupro patch.  We increased it in the recent past to 6 mg daily.  He emailed recently regarding some swelling he had noticed in the left upper and lower extremities.  He had an ultrasound of the left upper extremity which was negative for a blood clot.       I first met him on 04/03/2018 at the request of Dr. Barbaraann Barthel, at which time the patient reported a several month history, probably a 1-monthhistory of left hand tremor, neck stiffness, difficulty with fine motor control with the left upper extremity, and hand stiffness.  His history and physical examination were in keeping with parkinsonism on the left side.  We talked about symptomatic treatment options to be utilized soon, I suggested we proceed with a nuclear medicine DaTscan.  Due to the COVID-19 pandemic, his scan has not yet been done, he was approved for it though.   The patient sent a MyChart message in the interim in late March 2020 indicating that he has noticed more symptoms including some difficulty with his speech, including slurring, increased salivation, causing drooling at times and some symptoms in his right hand.     04/03/2018: (He) reports a left hand tremor for the past several months, likely 6-9 months. In addition, he has noted difficulty with fine motor control, he  has noticed neck stiffness and left hand stiffness and difficulty performing fine motor skills with the left hand. I reviewed your office note from 01/17/2018. He has had radiating neck pain to the left arm. He has had physical therapy for this. His neck pain has improved. He fell in 2017 and had neck pain. He had a X ray neck on 03/26/17: IMPRESSION: No acute findings.   Mild spondylosis of the cervical spine with moderate to severe multilevel bilateral neural foraminal narrowing as described.   He denies a family history of Parkinson's disease or tremor. He denies any recent falls. He denies any constipation. Mood is fairly stable but he does admit to having a longer standing history of anxiety. He takes Ativan as needed for this. He also reports claustrophobia and having had difficulty going through an MRI in the past.   He has had some issues with becoming slower in his mobility. His brother recently commented on this. He is the youngest of 4 siblings, he has an older brother, then his sister, then another brother. He lives with his family which includes his wife and 19year old daughter and 11year old son. He works as a sArt gallery manager He is a nonsmoker and drinks alcohol in the form of wine, maybe 3 glasses per week on average, caffeine in the form of coffee, 3 cups per day on average. He tries to hydrate well. He exercises regularly.   His Past Medical History Is Significant For: Past Medical History:  Diagnosis Date   Family history of prostate cancer     DRE WNL; PSA never > 0.38   Fasting hyperglycemia     PMH of   GERD (gastroesophageal reflux disease)    Parkinson's disease    Tinnitus    w/o hearing loss    His Past Surgical History Is Significant For: Past Surgical History:  Procedure Laterality Date   COLONOSCOPY  05/2013   Neg, Dr PHenrene Pastor  KNEE SURGERY Right    TONSILLECTOMY AND ADENOIDECTOMY  VASECTOMY      His Family History Is Significant For: Family History   Problem Relation Age of Onset   COPD Mother        smoker   Prostate cancer Father 54       no longer with prostate cancer   Breast cancer Sister    COPD Paternal Aunt        smoker   Stroke Maternal Grandfather         late 35s   Heart disease Neg Hx    Colon cancer Neg Hx    Esophageal cancer Neg Hx    Pancreatic cancer Neg Hx    Rectal cancer Neg Hx    Stomach cancer Neg Hx    Diabetes Neg Hx    Other Neg Hx        low testosterone   Parkinson's disease Neg Hx     His Social History Is Significant For: Social History   Socioeconomic History   Marital status: Married    Spouse name: Not on file   Number of children: Not on file   Years of education: Not on file   Highest education level: Not on file  Occupational History   Not on file  Tobacco Use   Smoking status: Never   Smokeless tobacco: Never  Substance and Sexual Activity   Alcohol use: Yes    Comment: very rare wine , < one per month per pt.   Drug use: No   Sexual activity: Yes  Other Topics Concern   Not on file  Social History Narrative   Not on file   Social Determinants of Health   Financial Resource Strain: Not on file  Food Insecurity: Not on file  Transportation Needs: Not on file  Physical Activity: Not on file  Stress: Not on file  Social Connections: Not on file    His Allergies Are:  Allergies  Allergen Reactions   Penicillins     ? Reaction @ age 45  :   His Current Medications Are:  Outpatient Encounter Medications as of 04/20/2022  Medication Sig   carbidopa-levodopa (SINEMET IR) 25-100 MG tablet Take 1 tablet by mouth 4 (four) times daily. Take at 8 AM, 12, 4 PM and 8 PM daily.   escitalopram (LEXAPRO) 10 MG tablet TAKE ONE TABLET BY MOUTH EVERY MORNING   fluticasone (FLONASE) 50 MCG/ACT nasal spray Place 2 sprays into both nostrils daily.   gabapentin (NEURONTIN) 300 MG capsule Take 1 capsule (300 mg total) by mouth at bedtime.   LORazepam (ATIVAN) 0.5 MG tablet TAKE ONE  TABLET BY MOUTH TWICE A DAY AS NEEDED FOR ANXIETY   LORazepam (ATIVAN) 0.5 MG tablet 1 tab po bid prn anxiety   Multiple Vitamins-Minerals (MENS MULTIVITAMIN PLUS) TABS Take by mouth daily.   rOPINIRole (REQUIP) 1 MG tablet Take 2 tablets (2 mg total) by mouth at bedtime.   tadalafil (CIALIS) 20 MG tablet Take 0.5-1 tablets (10-20 mg total) by mouth every other day as needed for erectile dysfunction.   No facility-administered encounter medications on file as of 04/20/2022.  :  Review of Systems:  Out of a complete 14 point review of systems, all are reviewed and negative with the exception of these symptoms as listed below:  Review of Systems  Neurological:        Pt here for Parkinson's f/u  Pt mumbles, hold  left shoulder up  Pt had increased tremors in left leg  Objective:  Neurological Exam  Physical Exam Physical Examination:   Vitals:   04/20/22 1415  BP: 107/65  Pulse: 70    General Examination: The patient is a very pleasant 63 y.o. male in no acute distress. He appears well-developed and well-nourished and well groomed.   HEENT: Normocephalic, atraumatic, pupils are equal, round and reactive to light and accommodation. He has moderate facial masking and decreased eye blink rate.  Tracking mildly slow but no nystagmus.  Hearing is grossly intact. Face is otherwise symmetric, no dysarthria, mild to moderate hypophonia. He has mild to moderate nuchal rigidity. No carotid bruits. Airway examination reveals no significant mouth dryness, otherwise tongue protrudes centrally and palate elevates symmetrically, and no lip, neck or jaw tremor. No sialorrhea.   Chest: Clear to auscultation without wheezing, rhonchi or crackles noted.   Heart: S1+S2+0, regular and normal without murmurs, rubs or gallops noted.   Abdomen: Soft, non-tender and non-distended.   Extremities: There is no pitting edema in the distal lower extremities bilaterally.   Skin: Warm and dry without trophic  changes noted.   Musculoskeletal: exam reveals increase in stiffness in the left upper extremity with trigger finger left ring finger, stable.   Neurologically: Mental status: The patient is awake, alert and oriented in all 4 spheres. His immediate and remote memory, attention, language skills and fund of knowledge are appropriate. There is evidence of bradyphrenia.    (On 04/03/2018: on Archimedes spiral drawing he has no significant trembling with either hand, handwriting with the right hand is legible, not tremulous, slightly micrographic.)   He has no obvious resting tremor, perhaps slight and intermittent in the left upper extremity.  No significant postural or action tremor. Cranial nerves II - XII are as described above under HEENT exam. At baseline, mildly elevated right shoulder noted, unchanged.  Motor exam: Normal bulk, and strength, tone is increased in both upper extremities, more noticeable on the left upper extremity compared to the right.  He has no obvious resting tremor, slight postural tremor, fine motor skills are moderately impaired on the left side, better on the right. Cerebellar testing: No dysmetria or intention tremor. There is no truncal or gait ataxia. Sensory exam: intact to light touch in the upper and lower extremities. Gait, station and balance: He stands without difficulty, posture is mild to moderately stooped for age. Gait shows fairly good pace and stride length but near-absence of arm swing on the left, decreased arm swing on the right. Balance appears preserved.  Stable gait and balance.   Assessment and plan:    In summary, Mark [name] is a very pleasant 62 year old male with an underlying medical history of neck pain, reflux disease, chronic tinnitus, anxiety, and allergic rhinitis, who presents for FU consultation of his L sided Parkinson's disease. His DaT scan from 08/07/18 showed near absent activity in the putamen and asymmetric decreased activity in  the head of the RIGHT caudate nucleus. He was on Neupro, which we started in May 2020, we increased this to 8 mg strength once daily in December 2020 but he started having lightheadedness and more dizziness.  He works full-time and has to walk and stand a lot at work.  He also exercises on a regular basis.  He had Covid infection in July 2021. He was partially vaccinated in April 2021. We eventually decided to stop the Neupro and started immediate release Requip. We tried him on long-acting ropinirole in the interim which he did not tolerate as well.  He is currently on ropinirole 3 mg at night for restless leg symptoms, increase it on his own about 2 weeks ago.  I am okay with him take 3 mg of ropinirole at bedtime and change the prescription to the 3 mg strength 1 pill nightly. Melatonin did not help his sleep.  He has had intermittent paresthesias on the left side.  He tried gabapentin briefly in September 2020, I suggested we retry gabapentin at 300 mg at bedtime.  He tried it at 200 mg strength and then we increase it to 3 mg at bedtime back in November 2023.  We increased his Sinemet from 1 pill 3 times daily to 1 pill 4 times daily in September 2023.  He was started on amantadine by Dr. Linus Mako. He is advised to stay well-hydrated and well rested.  He has had speech therapy.  He had seen orthopedics for his trigger finger.  He is advised to follow-up routinely in 6 months to see one of our nurse practitioners, we will maintain his current medication regimen.  I answered all their questions today and the patient and his wife are in agreement. I spent 40 minutes in total face-to-face time and in reviewing records during pre-charting, more than 50% of which was spent in counseling and coordination of care, reviewing test results, reviewing medications and treatment regimen and/or in discussing or reviewing the diagnosis of PD, the prognosis and treatment options. Pertinent laboratory and imaging test results  that were available during this visit with the patient were reviewed by me and considered in my medical decision making (see chart for details).

## 2022-05-05 ENCOUNTER — Encounter: Payer: Self-pay | Admitting: Neurology

## 2022-05-16 ENCOUNTER — Ambulatory Visit: Payer: 59 | Admitting: Medical

## 2022-05-16 VITALS — BP 112/74 | HR 63 | Resp 18 | Ht 72.0 in | Wt 174.0 lb

## 2022-05-16 DIAGNOSIS — M5440 Lumbago with sciatica, unspecified side: Secondary | ICD-10-CM | POA: Diagnosis not present

## 2022-05-16 MED ORDER — PREDNISONE 10 MG (21) PO TBPK: ORAL_TABLET | ORAL | 0 refills | Status: DC

## 2022-05-16 MED ORDER — CYCLOBENZAPRINE HCL 5 MG PO TABS: 5.0000 mg | ORAL_TABLET | Freq: Every day | ORAL | 0 refills | Status: DC

## 2022-05-16 NOTE — Progress Notes (Signed)
Subjective:    Patient ID: Mark [name], male    DOB: 07-04-1960, 62 y.o.   MRN: [id number]  HPI  Pt states one week ago he was picking up side of back to slide something under. He heard a pop and felt high level pain. Pt states pain level not improving much. He state when bending over has 9/10 level. At rest seated 4-5 level pain. Some recent thigh aching minimal/stiff but no pain shooting from back to his legs.   This happened Sunday 24, 2024.  Pt tried ibuprofen 400 mg today and did not help much.    Review of Systems  Constitutional:  Negative for chills and fatigue.  Respiratory:  Negative for cough, chest tightness and wheezing.   Cardiovascular:  Negative for chest pain and palpitations.  Gastrointestinal:  Negative for abdominal pain.  Genitourinary:  Negative for dysuria.  Musculoskeletal:  Positive for back pain.  Neurological:  Negative for dizziness, seizures, facial asymmetry and light-headedness.  Hematological:  Negative for adenopathy. Does not bruise/bleed easily.   Past Medical History:  Diagnosis Date   Family history of prostate cancer     DRE WNL; PSA never > 0.38   Fasting hyperglycemia     PMH of   GERD (gastroesophageal reflux disease)    Parkinson's disease    Tinnitus    w/o hearing loss     Social History   Socioeconomic History   Marital status: Married    Spouse name: Not on file   Number of children: Not on file   Years of education: Not on file   Highest education level: Associate degree: academic program  Occupational History   Not on file  Tobacco Use   Smoking status: Never   Smokeless tobacco: Never  Substance and Sexual Activity   Alcohol use: Yes    Comment: very rare wine , < one per month per pt.   Drug use: No   Sexual activity: Yes  Other Topics Concern   Not on file  Social History Narrative   Not on file   Social Determinants of Health   Financial Resource Strain: Low Risk  (05/16/2022)   Overall Financial  Resource Strain (CARDIA)    Difficulty of Paying Living Expenses: Not hard at all  Food Insecurity: No Food Insecurity (05/16/2022)   Hunger Vital Sign    Worried About Running Out of Food in the Last Year: Never true    Ran Out of Food in the Last Year: Never true  Transportation Needs: No Transportation Needs (05/16/2022)   PRAPARE - Hydrologist (Medical): No    Lack of Transportation (Non-Medical): No  Physical Activity: Sufficiently Active (05/16/2022)   Exercise Vital Sign    Days of Exercise per Week: 5 days    Minutes of Exercise per Session: 40 min  Stress: No Stress Concern Present (05/16/2022)   [place]    Feeling of Stress : Not at all  Social Connections: Moderately Integrated (05/16/2022)   Social Connection and Isolation Panel [NHANES]    Frequency of Communication with Friends and Family: Once a week    Frequency of Social Gatherings with Friends and Family: Once a week    Attends Religious Services: More than 4 times per year    Active Member of [employer] or Organizations: Yes    Attends Music therapist: More than 4 times per year    Marital Status: Married  Intimate Partner Violence: Not on file    Past Surgical History:  Procedure Laterality Date   COLONOSCOPY  05/2013   Neg, Dr Henrene Pastor   KNEE SURGERY Right    TONSILLECTOMY AND ADENOIDECTOMY     VASECTOMY      Family History  Problem Relation Age of Onset   COPD Mother        smoker   Prostate cancer Father 15       no longer with prostate cancer   Breast cancer Sister    COPD Paternal Aunt        smoker   Stroke Maternal Grandfather         late 75s   Heart disease Neg Hx    Colon cancer Neg Hx    Esophageal cancer Neg Hx    Pancreatic cancer Neg Hx    Rectal cancer Neg Hx    Stomach cancer Neg Hx    Diabetes Neg Hx    Other Neg Hx        low testosterone   Parkinson's disease Neg Hx      Allergies  Allergen Reactions   Penicillins     ? Reaction @ age 89    Current Outpatient Medications on File Prior to Visit  Medication Sig Dispense Refill   carbidopa-levodopa (SINEMET IR) 25-100 MG tablet Take 1 tablet by mouth 4 (four) times daily. Take at 8 AM, 12, 4 PM and 8 PM daily. 360 tablet 3   escitalopram (LEXAPRO) 10 MG tablet TAKE ONE TABLET BY MOUTH EVERY MORNING 30 tablet 5   fluticasone (FLONASE) 50 MCG/ACT nasal spray Place 2 sprays into both nostrils daily. 16 g 6   gabapentin (NEURONTIN) 300 MG capsule Take 1 capsule (300 mg total) by mouth at bedtime. 30 capsule 5   LORazepam (ATIVAN) 0.5 MG tablet TAKE ONE TABLET BY MOUTH TWICE A DAY AS NEEDED FOR ANXIETY 60 tablet 0   LORazepam (ATIVAN) 0.5 MG tablet 1 tab po bid prn anxiety 45 tablet 5   Multiple Vitamins-Minerals (MENS MULTIVITAMIN PLUS) TABS Take by mouth daily.     rOPINIRole (REQUIP) 3 MG tablet Take 1 tablet (3 mg total) by mouth at bedtime. 90 tablet 3   tadalafil (CIALIS) 20 MG tablet Take 0.5-1 tablets (10-20 mg total) by mouth every other day as needed for erectile dysfunction. 10 tablet 11   No current facility-administered medications on file prior to visit.    BP 112/74   Pulse 63   Resp 18   Ht 6' (1.829 m)   Wt 174 lb (78.9 kg)   SpO2 100%   BMI 23.60 kg/m        Objective:   Physical Exam  General Appearance- Not in acute distress.    Chest and Lung Exam Auscultation: Breath sounds:-Normal. Clear even and unlabored. Adventitious sounds:- No Adventitious sounds.  Cardiovascular Auscultation:Rythm - Regular, rate and rythm. Heart Sounds -Normal heart sounds.  Abdomen Inspection:-Inspection Normal.  Palpation/Perucssion: Palpation and Percussion of the abdomen reveal- Non Tender, No Rebound tenderness, No rigidity(Guarding) and No Palpable abdominal masses.  Liver:-Normal.  Spleen:- Normal.   Back  No Mid lumbar spine tenderness to palpation. But laying down supine pain  in mid lower lumbar spine Pain on straight leg lift. Pain on lateral movements and flexion/extension of the spine.  Lower ext neurologic  L5-S1 sensation intact bilaterally. Normal patellar reflexes bilaterally. No foot drop bilaterally.       Assessment & Plan:   For acute  low back pain for about 10 days ordered 6 day taper dose of prednisone and flexeril 5 mg to use at night. Rx advisement.  If pain persists despite the above then get lumbar xray. Order placed but I think too early to get presently.  Back stretching exercises. As tolerated.  Red flag symptoms discussed.   Follow up 2 weeks or sooner if needed.  Mackie Pai, PA-C

## 2022-05-16 NOTE — Patient Instructions (Addendum)
For acute low back pain for about 10 days ordered 6 day taper dose of prednisone and flexeril 5 mg to use at night. Rx advisement.  If pain persists despite the above then get lumbar xray. Order placed but I think too early to get presently.  Back stretching exercises. As tolerated.  Red flag symptoms discussed(notify us if you have any)  Follow up 2 weeks or sooner if needed.    Back Exercises These exercises help to make your trunk and back strong. They also help to keep the lower back flexible. Doing these exercises can help to prevent or lessen pain in your lower back. If you have back pain, try to do these exercises 2-3 times each day or as told by your doctor. As you get better, do the exercises once each day. Repeat the exercises more often as told by your doctor. To stop back pain from coming back, do the exercises once each day, or as told by your doctor. Do exercises exactly as told by your doctor. Stop right away if you feel sudden pain or your pain gets worse. Exercises Single knee to chest Do these steps 3-5 times in a row for each leg: Lie on your back on a firm bed or the floor with your legs stretched out. Bring one knee to your chest. Grab your knee or thigh with both hands and hold it in place. Pull on your knee until you feel a gentle stretch in your lower back or butt. Keep doing the stretch for 10-30 seconds. Slowly let go of your leg and straighten it. Pelvic tilt Do these steps 5-10 times in a row: Lie on your back on a firm bed or the floor with your legs stretched out. Bend your knees so they point up to the ceiling. Your feet should be flat on the floor. Tighten your lower belly (abdomen) muscles to press your lower back against the floor. This will make your tailbone point up to the ceiling instead of pointing down to your feet or the floor. Stay in this position for 5-10 seconds while you gently tighten your muscles and breathe evenly. Cat-cow Do these  steps until your lower back bends more easily: Get on your hands and knees on a firm bed or the floor. Keep your hands under your shoulders, and keep your knees under your hips. You may put padding under your knees. Let your head hang down toward your chest. Tighten (contract) the muscles in your belly. Point your tailbone toward the floor so your lower back becomes rounded like the back of a cat. Stay in this position for 5 seconds. Slowly lift your head. Let the muscles of your belly relax. Point your tailbone up toward the ceiling so your back forms a sagging arch like the back of a cow. Stay in this position for 5 seconds.  Press-ups Do these steps 5-10 times in a row: Lie on your belly (face-down) on a firm bed or the floor. Place your hands near your head, about shoulder-width apart. While you keep your back relaxed and keep your hips on the floor, slowly straighten your arms to raise the top half of your body and lift your shoulders. Do not use your back muscles. You may change where you place your hands to make yourself more comfortable. Stay in this position for 5 seconds. Keep your back relaxed. Slowly return to lying flat on the floor.  Bridges Do these steps 10 times in a row: Lie on  your back on a firm bed or the floor. Bend your knees so they point up to the ceiling. Your feet should be flat on the floor. Your arms should be flat at your sides, next to your body. Tighten your butt muscles and lift your butt off the floor until your waist is almost as high as your knees. If you do not feel the muscles working in your butt and the back of your thighs, slide your feet 1-2 inches (2.5-5 cm) farther away from your butt. Stay in this position for 3-5 seconds. Slowly lower your butt to the floor, and let your butt muscles relax. If this exercise is too easy, try doing it with your arms crossed over your chest. Belly crunches Do these steps 5-10 times in a row: Lie on your back on a  firm bed or the floor with your legs stretched out. Bend your knees so they point up to the ceiling. Your feet should be flat on the floor. Cross your arms over your chest. Tip your chin a little bit toward your chest, but do not bend your neck. Tighten your belly muscles and slowly raise your chest just enough to lift your shoulder blades a tiny bit off the floor. Avoid raising your body higher than that because it can put too much stress on your lower back. Slowly lower your chest and your head to the floor. Back lifts Do these steps 5-10 times in a row: Lie on your belly (face-down) with your arms at your sides, and rest your forehead on the floor. Tighten the muscles in your legs and your butt. Slowly lift your chest off the floor while you keep your hips on the floor. Keep the back of your head in line with the curve in your back. Look at the floor while you do this. Stay in this position for 3-5 seconds. Slowly lower your chest and your face to the floor. Contact a doctor if: Your back pain gets a lot worse when you do an exercise. Your back pain does not get better within 2 hours after you exercise. If you have any of these problems, stop doing the exercises. Do not do them again unless your doctor says it is okay. Get help right away if: You have sudden, very bad back pain. If this happens, stop doing the exercises. Do not do them again unless your doctor says it is okay. This information is not intended to replace advice given to you by your health care provider. Make sure you discuss any questions you have with your health care provider. Document Revised: 04/14/2020 Document Reviewed: 04/14/2020 Elsevier Patient Education  [place].

## 2022-05-17 ENCOUNTER — Encounter: Payer: Self-pay | Admitting: Medical

## 2022-05-23 ENCOUNTER — Encounter: Payer: Self-pay | Admitting: Speech Pathology

## 2022-05-23 NOTE — Therapy (Signed)
[facility] [address] [place], [place], [address] Phone: [phone]   Fax:  [phone]  Patient Details  Name: Femi Webb MRN: [id number] Date of Birth: 1960-02-24 Referring Provider:  No ref. provider found  Encounter Date: 05/23/2022  SPEECH THERAPY DISCHARGE SUMMARY  Visits from Start of Care: 2  Current functional level related to goals / functional outcomes: Unable to comment 2/2 pt not returning for scheduled visits.    Remaining deficits: dysarthria   Education / Equipment: HEP, dysarthria strategies and compensations   Patient agrees to discharge. Patient goals were not met. Patient is being discharged due to not returning since the last visit.   Maia Breslow, CCC-SLP 05/23/2022, 9:20 AM  [facility] [address] [place], [place], [address] Phone: [phone]   Fax:  [phone]

## 2022-05-26 ENCOUNTER — Ambulatory Visit: Payer: 59 | Admitting: Medical

## 2022-05-26 VITALS — BP 112/72 | HR 66 | Resp 18 | Ht 72.0 in | Wt 173.0 lb

## 2022-05-26 DIAGNOSIS — Z0000 Encounter for general adult medical examination without abnormal findings: Secondary | ICD-10-CM

## 2022-05-26 DIAGNOSIS — F419 Anxiety disorder, unspecified: Secondary | ICD-10-CM

## 2022-05-26 DIAGNOSIS — Z23 Encounter for immunization: Secondary | ICD-10-CM

## 2022-05-26 DIAGNOSIS — Z125 Encounter for screening for malignant neoplasm of prostate: Secondary | ICD-10-CM

## 2022-05-26 DIAGNOSIS — D649 Anemia, unspecified: Secondary | ICD-10-CM | POA: Diagnosis not present

## 2022-05-26 LAB — CBC WITH DIFFERENTIAL/PLATELET
Basophils Absolute: 0 10*3/uL (ref 0.0–0.1)
Basophils Relative: 0.2 % (ref 0.0–3.0)
Eosinophils Absolute: 0.1 10*3/uL (ref 0.0–0.7)
Eosinophils Relative: 1.4 % (ref 0.0–5.0)
HCT: 36.4 % — ABNORMAL LOW (ref 39.0–52.0)
Hemoglobin: 11.8 g/dL — ABNORMAL LOW (ref 13.0–17.0)
Lymphocytes Relative: 14.6 % (ref 12.0–46.0)
Lymphs Abs: 1.4 10*3/uL (ref 0.7–4.0)
MCHC: 32.6 g/dL (ref 30.0–36.0)
MCV: 86.6 fl (ref 78.0–100.0)
Monocytes Absolute: 0.9 10*3/uL (ref 0.1–1.0)
Monocytes Relative: 9.5 % (ref 3.0–12.0)
Neutro Abs: 7.3 10*3/uL (ref 1.4–7.7)
Neutrophils Relative %: 74.3 % (ref 43.0–77.0)
Platelets: 189 10*3/uL (ref 150.0–400.0)
RBC: 4.2 Mil/uL — ABNORMAL LOW (ref 4.22–5.81)
RDW: 13.4 % (ref 11.5–15.5)
WBC: 9.8 10*3/uL (ref 4.0–10.5)

## 2022-05-26 LAB — COMPREHENSIVE METABOLIC PANEL
ALT: 11 U/L (ref 0–53)
AST: 25 U/L (ref 0–37)
Albumin: 3.9 g/dL (ref 3.5–5.2)
Alkaline Phosphatase: 102 U/L (ref 39–117)
BUN: 17 mg/dL (ref 6–23)
CO2: 31 mEq/L (ref 19–32)
Calcium: 8.7 mg/dL (ref 8.4–10.5)
Chloride: 101 mEq/L (ref 96–112)
Creatinine, Ser: 0.94 mg/dL (ref 0.40–1.50)
GFR: 88.75 mL/min (ref >60.00)
Glucose, Bld: 70 mg/dL (ref 70–99)
Potassium: 4.2 mEq/L (ref 3.5–5.1)
Sodium: 140 mEq/L (ref 135–145)
Total Bilirubin: 0.4 mg/dL (ref 0.2–1.2)
Total Protein: 6.4 g/dL (ref 6.0–8.3)

## 2022-05-26 LAB — LIPID PANEL
Cholesterol: 190 mg/dL (ref 0–200)
HDL: 67.2 mg/dL (ref >39.00)
LDL Cholesterol: 105 mg/dL — ABNORMAL HIGH (ref 0–99)
NonHDL: 122.86
Total CHOL/HDL Ratio: 3
Triglycerides: 87 mg/dL (ref 0.0–149.0)
VLDL: 17.4 mg/dL (ref 0.0–40.0)

## 2022-05-26 LAB — PSA: PSA: 0.68 ng/mL (ref 0.10–4.00)

## 2022-05-26 NOTE — Addendum Note (Signed)
Addended by: Maximino Sarin on: 05/26/2022 09:41 AM   Modules accepted: Orders

## 2022-05-26 NOTE — Progress Notes (Addendum)
Subjective:    Patient ID: Mark [name], male    DOB: [birth year], 62 y.o.   MRN: [id number]  HPI  In for wellness exam. He is fasting.  Pt manages show room sales, Pt walking/running 3-4 times a weeks.(3-4 miles).  Some boxing and cardio. Pt does drink 2 cups of coffee a day. Married- 2 children   Family history of prostate cancer. Dx at 70 years.   Due for colonoscopy next April 2025.  Pt will get shingrix vaccine today.   Anxiety hx- on ativan. No reported adverse side effects. Due for update controlled med contract and uds. Last rx 60 tabs a month.   Review of Systems  Constitutional:  Negative for diaphoresis, fatigue and fever.  HENT:  Negative for congestion and ear discharge.   Respiratory:  Negative for cough, chest tightness and wheezing.   Cardiovascular:  Negative for chest pain and palpitations.  Gastrointestinal:  Negative for abdominal pain and constipation.  Genitourinary:  Negative for dysuria and flank pain.  Musculoskeletal:  Negative for back pain, myalgias and neck stiffness.  Skin:  Negative for rash.  Neurological:  Negative for tremors, seizures, facial asymmetry and numbness.  Hematological:  Negative for adenopathy. Does not bruise/bleed easily.  Psychiatric/Behavioral:  Negative for behavioral problems, decreased concentration and dysphoric mood.     Past Medical History:  Diagnosis Date   Family history of prostate cancer     DRE WNL; PSA never > 0.38   Fasting hyperglycemia     PMH of   GERD (gastroesophageal reflux disease)    Parkinson's disease    Tinnitus    w/o hearing loss     Social History   Socioeconomic History   Marital status: Married    Spouse name: Not on file   Number of children: Not on file   Years of education: Not on file   Highest education level: Associate degree: academic program  Occupational History   Not on file  Tobacco Use   Smoking status: Never   Smokeless tobacco: Never  Substance and Sexual  Activity   Alcohol use: Yes    Comment: very rare wine , < one per month per pt.   Drug use: No   Sexual activity: Yes  Other Topics Concern   Not on file  Social History Narrative   Not on file   Social Determinants of Health   Financial Resource Strain: Low Risk  (05/16/2022)   Overall Financial Resource Strain (CARDIA)    Difficulty of Paying Living Expenses: Not hard at all  Food Insecurity: No Food Insecurity (05/16/2022)   Hunger Vital Sign    Worried About Running Out of Food in the Last Year: Never true    Ran Out of Food in the Last Year: Never true  Transportation Needs: No Transportation Needs (05/16/2022)   PRAPARE - Administrator, Civil Service (Medical): No    Lack of Transportation (Non-Medical): No  Physical Activity: Sufficiently Active (05/16/2022)   Exercise Vital Sign    Days of Exercise per Week: 5 days    Minutes of Exercise per Session: 40 min  Stress: No Stress Concern Present (05/16/2022)   [employer] of Occupational Health - Occupational Stress Questionnaire    Feeling of Stress : Not at all  Social Connections: Moderately Integrated (05/16/2022)   Social Connection and Isolation Panel [NHANES]    Frequency of Communication with Friends and Family: Once a week    Frequency of Social Gatherings with  Friends and Family: Once a week    Attends Religious Services: More than 4 times per year    Active Member of Clubs or Organizations: Yes    Attends Banker Meetings: More than 4 times per year    Marital Status: Married  Catering manager Violence: Not on file    Past Surgical History:  Procedure Laterality Date   COLONOSCOPY  05/2013   Neg, Dr Marina Goodell   KNEE SURGERY Right    TONSILLECTOMY AND ADENOIDECTOMY     VASECTOMY      Family History  Problem Relation Age of Onset   COPD Mother        smoker   Prostate cancer Father 78       no longer with prostate cancer   Breast cancer Sister    COPD Paternal Aunt        smoker    Stroke Maternal Grandfather         late 10s   Heart disease Neg Hx    Colon cancer Neg Hx    Esophageal cancer Neg Hx    Pancreatic cancer Neg Hx    Rectal cancer Neg Hx    Stomach cancer Neg Hx    Diabetes Neg Hx    Other Neg Hx        low testosterone   Parkinson's disease Neg Hx     Allergies  Allergen Reactions   Penicillins     ? Reaction @ age 46    Current Outpatient Medications on File Prior to Visit  Medication Sig Dispense Refill   carbidopa-levodopa (SINEMET IR) 25-100 MG tablet Take 1 tablet by mouth 4 (four) times daily. Take at 8 AM, 12, 4 PM and 8 PM daily. 360 tablet 3   cyclobenzaprine (FLEXERIL) 5 MG tablet Take 1 tablet (5 mg total) by mouth at bedtime. 7 tablet 0   escitalopram (LEXAPRO) 10 MG tablet TAKE ONE TABLET BY MOUTH EVERY MORNING 30 tablet 5   fluticasone (FLONASE) 50 MCG/ACT nasal spray Place 2 sprays into both nostrils daily. 16 g 6   gabapentin (NEURONTIN) 300 MG capsule Take 1 capsule (300 mg total) by mouth at bedtime. 30 capsule 5   LORazepam (ATIVAN) 0.5 MG tablet TAKE ONE TABLET BY MOUTH TWICE A DAY AS NEEDED FOR ANXIETY 60 tablet 0   LORazepam (ATIVAN) 0.5 MG tablet 1 tab po bid prn anxiety 45 tablet 5   Multiple Vitamins-Minerals (MENS MULTIVITAMIN PLUS) TABS Take by mouth daily.     predniSONE (STERAPRED UNI-PAK 21 TAB) 10 MG (21) TBPK tablet Taper over 6 days. 21 tablet 0   rOPINIRole (REQUIP) 3 MG tablet Take 1 tablet (3 mg total) by mouth at bedtime. 90 tablet 3   tadalafil (CIALIS) 20 MG tablet Take 0.5-1 tablets (10-20 mg total) by mouth every other day as needed for erectile dysfunction. 10 tablet 11   No current facility-administered medications on file prior to visit.    BP 112/72   Pulse 66   Resp 18   Ht 6' (1.829 m)   Wt 173 lb (78.5 kg)   SpO2 99%   BMI 23.46 kg/m        Objective:   Physical Exam   General Mental Status- Alert. General Appearance- Not in acute distress.   Skin General: Color- Normal Color.  Moisture- Normal Moisture.  Neck Carotid Arteries- Normal color. Moisture- Normal Moisture. No carotid bruits. No JVD.  Chest and Lung Exam Auscultation: Breath Sounds:-Normal.  Cardiovascular  Auscultation:Rythm- Regular. Murmurs & Other Heart Sounds:Auscultation of the heart reveals- No Murmurs.  Abdomen Inspection:-Inspeection Normal. Palpation/Percussion:Note:No mass. Palpation and Percussion of the abdomen reveal- Non Tender, Non Distended + BS, no rebound or guarding.  Neurologic Cranial Nerve exam:- CN III-XII intact(No nystagmus), symmetric smile. Strength:- 5/5 equal and symmetric strength both upper and lower extremities.      Assessment & Plan:   Patient Instructions  For you wellness exam today I have ordered cbc, cmp and lipid panel.  Shingrix vaccine today. Second vaccine 1-6 months.   Recommend exercise and healthy diet.  We will let you know lab results as they come in.  Follow up date appointment will be determined after lab review.     Anxiety controlled. Current has ativan rx. Up date contract today and uds.     Esperanza Richters, [place]    [id number] charge did controlled med visit for anxiety/benzo use today.

## 2022-05-26 NOTE — Patient Instructions (Addendum)
For you wellness exam today I have ordered cbc, cmp and lipid panel.  Shingrix vaccine today. Second vaccine 1-6 months.   Recommend exercise and healthy diet.  We will let you know lab results as they come in.  Follow up date appointment will be determined after lab review.     Anxiety controlled. Current has ativan rx. Today controlled med visit.   Preventive Care 51-60 Years Old, Male Preventive care refers to lifestyle choices and visits with your health care provider that can promote health and wellness. Preventive care visits are also called wellness exams. What can I expect for my preventive care visit? Counseling During your preventive care visit, your health care provider may ask about your: Medical history, including: Past medical problems. Family medical history. Current health, including: Emotional well-being. Home life and relationship well-being. Sexual activity. Lifestyle, including: Alcohol, nicotine or tobacco, and drug use. Access to firearms. Diet, exercise, and sleep habits. Safety issues such as seatbelt and bike helmet use. Sunscreen use. Work and work Astronomer. Physical exam Your health care provider will check your: Height and weight. These may be used to calculate your BMI (body mass index). BMI is a measurement that tells if you are at a healthy weight. Waist circumference. This measures the distance around your waistline. This measurement also tells if you are at a healthy weight and may help predict your risk of certain diseases, such as type 2 diabetes and high blood pressure. Heart rate and blood pressure. Body temperature. Skin for abnormal spots. What immunizations do I need?  Vaccines are usually given at various ages, according to a schedule. Your health care provider will recommend vaccines for you based on your age, medical history, and lifestyle or other factors, such as travel or where you work. What tests do I need? Screening Your  health care provider may recommend screening tests for certain conditions. This may include: Lipid and cholesterol levels. Diabetes screening. This is done by checking your blood sugar (glucose) after you have not eaten for a while (fasting). Hepatitis B test. Hepatitis C test. HIV (human immunodeficiency virus) test. STI (sexually transmitted infection) testing, if you are at risk. Lung cancer screening. Prostate cancer screening. Colorectal cancer screening. Talk with your health care provider about your test results, treatment options, and if necessary, the need for more tests. Follow these instructions at home: Eating and drinking  Eat a diet that includes fresh fruits and vegetables, whole grains, lean protein, and low-fat dairy products. Take vitamin and mineral supplements as recommended by your health care provider. Do not drink alcohol if your health care provider tells you not to drink. If you drink alcohol: Limit how much you have to 0-2 drinks a day. Know how much alcohol is in your drink. In the [place] one drink equals one 12 oz bottle of beer (355 mL), one 5 oz glass of wine (148 mL), or one 1 oz glass of hard liquor (44 mL). Lifestyle Brush your teeth every morning and night with fluoride toothpaste. Floss one time each day. Exercise for at least 30 minutes 5 or more days each week. Do not use any products that contain nicotine or tobacco. These products include cigarettes, chewing tobacco, and vaping devices, such as e-cigarettes. If you need help quitting, ask your health care provider. Do not use drugs. If you are sexually active, practice safe sex. Use a condom or other form of protection to prevent STIs. Take aspirin only as told by your health care provider. Make  sure that you understand how much to take and what form to take. Work with your health care provider to find out whether it is safe and beneficial for you to take aspirin daily. Find healthy ways to manage  stress, such as: Meditation, yoga, or listening to music. Journaling. Talking to a trusted person. Spending time with friends and family. Minimize exposure to UV radiation to reduce your risk of skin cancer. Safety Always wear your seat belt while driving or riding in a vehicle. Do not drive: If you have been drinking alcohol. Do not ride with someone who has been drinking. When you are tired or distracted. While texting. If you have been using any mind-altering substances or drugs. Wear a helmet and other protective equipment during sports activities. If you have firearms in your house, make sure you follow all gun safety procedures. What's next? Go to your health care provider once a year for an annual wellness visit. Ask your health care provider how often you should have your eyes and teeth checked. Stay up to date on all vaccines. This information is not intended to replace advice given to you by your health care provider. Make sure you discuss any questions you have with your health care provider. Document Revised: 07/28/2020 Document Reviewed: 07/28/2020 Elsevier Patient Education  2023 [employer].

## 2022-05-27 NOTE — Addendum Note (Signed)
Addended by: Gwenevere Abbot on: 05/27/2022 09:08 AM   Modules accepted: Orders

## 2022-06-05 ENCOUNTER — Other Ambulatory Visit: Payer: 59

## 2022-06-07 ENCOUNTER — Other Ambulatory Visit (INDEPENDENT_AMBULATORY_CARE_PROVIDER_SITE_OTHER): Payer: 59

## 2022-06-07 DIAGNOSIS — D649 Anemia, unspecified: Secondary | ICD-10-CM | POA: Diagnosis not present

## 2022-06-08 LAB — IRON,TIBC AND FERRITIN PANEL
%SAT: 12 % (calc) — ABNORMAL LOW (ref 20–48)
Ferritin: 9 ng/mL — ABNORMAL LOW (ref 38–380)
Iron: 47 ug/dL — ABNORMAL LOW (ref 50–180)
TIBC: 402 mcg/dL (calc) (ref 250–425)

## 2022-06-08 LAB — CBC WITH DIFFERENTIAL/PLATELET
Basophils Absolute: 0 10*3/uL (ref 0.0–0.1)
Basophils Relative: 1.1 % (ref 0.0–3.0)
Eosinophils Absolute: 0.2 10*3/uL (ref 0.0–0.7)
Eosinophils Relative: 3.7 % (ref 0.0–5.0)
HCT: 34.1 % — ABNORMAL LOW (ref 39.0–52.0)
Hemoglobin: 11.3 g/dL — ABNORMAL LOW (ref 13.0–17.0)
Lymphocytes Relative: 30.3 % (ref 12.0–46.0)
Lymphs Abs: 1.3 10*3/uL (ref 0.7–4.0)
MCHC: 33.1 g/dL (ref 30.0–36.0)
MCV: 84.7 fl (ref 78.0–100.0)
Monocytes Absolute: 0.5 10*3/uL (ref 0.1–1.0)
Monocytes Relative: 10.9 % (ref 3.0–12.0)
Neutro Abs: 2.3 10*3/uL (ref 1.4–7.7)
Neutrophils Relative %: 54 % (ref 43.0–77.0)
Platelets: 174 10*3/uL (ref 150.0–400.0)
RBC: 4.03 Mil/uL — ABNORMAL LOW (ref 4.22–5.81)
RDW: 13.5 % (ref 11.5–15.5)
WBC: 4.3 10*3/uL (ref 4.0–10.5)

## 2022-06-09 ENCOUNTER — Other Ambulatory Visit (INDEPENDENT_AMBULATORY_CARE_PROVIDER_SITE_OTHER): Payer: 59

## 2022-06-09 DIAGNOSIS — D649 Anemia, unspecified: Secondary | ICD-10-CM | POA: Diagnosis not present

## 2022-06-10 MED ORDER — IRON (FERROUS SULFATE) 325 (65 FE) MG PO TABS: 325.0000 mg | ORAL_TABLET | Freq: Every day | ORAL | 1 refills | Status: DC

## 2022-06-10 NOTE — Addendum Note (Signed)
Addended by: Gwenevere Abbot on: 06/10/2022 03:30 PM   Modules accepted: Orders

## 2022-06-13 ENCOUNTER — Encounter: Payer: Self-pay | Admitting: Medical

## 2022-06-13 LAB — FECAL OCCULT BLOOD, IMMUNOCHEMICAL: Fecal Occult Bld: NEGATIVE

## 2022-06-14 MED ORDER — IRON (FERROUS SULFATE) 325 (65 FE) MG PO TABS: 325.0000 mg | ORAL_TABLET | Freq: Every day | ORAL | 1 refills | Status: AC

## 2022-06-14 NOTE — Addendum Note (Signed)
Addended by: Gwenevere Abbot on: 06/14/2022 06:48 PM   Modules accepted: Orders

## 2022-06-18 ENCOUNTER — Other Ambulatory Visit: Payer: Self-pay | Admitting: Medical

## 2022-06-19 NOTE — Telephone Encounter (Signed)
Requesting: lorazepam 0.5mg  Contract: 11/25/21 UDS: 11/25/21 Last Visit: 05/26/22 Next Visit: None Last Refill: 11/28/21 #45 and 5rf  Please Advise

## 2022-06-19 NOTE — Telephone Encounter (Signed)
Rx sent to pt pharmacy 

## 2022-07-06 ENCOUNTER — Other Ambulatory Visit: Payer: Self-pay | Admitting: Neurology

## 2022-07-18 ENCOUNTER — Ambulatory Visit (INDEPENDENT_AMBULATORY_CARE_PROVIDER_SITE_OTHER): Payer: 59 | Admitting: Medical

## 2022-07-18 VITALS — BP 102/66 | HR 80 | Temp 98.0°F | Resp 18 | Ht 72.0 in | Wt 172.0 lb

## 2022-07-18 DIAGNOSIS — T7840XA Allergy, unspecified, initial encounter: Secondary | ICD-10-CM

## 2022-07-18 DIAGNOSIS — W57XXXA Bitten or stung by nonvenomous insect and other nonvenomous arthropods, initial encounter: Secondary | ICD-10-CM

## 2022-07-18 DIAGNOSIS — S20462A Insect bite (nonvenomous) of left back wall of thorax, initial encounter: Secondary | ICD-10-CM | POA: Diagnosis not present

## 2022-07-18 DIAGNOSIS — L089 Local infection of the skin and subcutaneous tissue, unspecified: Secondary | ICD-10-CM

## 2022-07-18 MED ORDER — METHYLPREDNISOLONE 4 MG PO TABS: 4.0000 mg | ORAL_TABLET | Freq: Every day | ORAL | 0 refills | Status: DC

## 2022-07-18 MED ORDER — DOXYCYCLINE HYCLATE 100 MG PO TABS: 100.0000 mg | ORAL_TABLET | Freq: Two times a day (BID) | ORAL | 0 refills | Status: DC

## 2022-07-18 NOTE — Patient Instructions (Addendum)
Tick Bite with concern skin infection: Tick attached for an estimated 3-4 days with a 2 cm indurated, erythematous area at the site of attachment. No systemic symptoms. Discussed the risk of Lyme disease and [facility] spotted fever and the limitations of early antibody testing. -Start Doxycycline for 10 days. -Follow-up in 10 days to assess resolution of skin changes.  Possible Poison Ivy Exposure: Mild pruritus on the left arm with a rash that developed after working in an area with suspected poison ivy. No significant discomfort or sleep disturbance. -Start Medrol Dosepak for 6 days. -Apply Kenalog topically.  Follow up 10 days or sooner if needed.

## 2022-07-18 NOTE — Progress Notes (Signed)
   Subjective:    Patient ID: Mark [name], male    DOB: 1960/06/04, 62 y.o.   MRN: [id number]  HPI Discussed the use of AI scribe software for clinical note transcription with the patient, who gave verbal consent to proceed.  History of Present Illness   The patient, with no known history of tick bites, reports a recent tick bite incident that occurred while working in the yard on Saturday. He noticed a large red spot on his back, which upon closer inspection by his spouse, was identified as a tick. The tick was removed with tweezers after several attempts, but the patient believes some parts of the tick may still be embedded in the skin. He denies any associated symptoms such as fever, chills, sweats, or bumps.  In addition to the tick bite, the patient also reports a rash on his left arm, which he suspects may be due to poison ivy exposure during the same yard work. The rash, which started itching the day after the yard work, is not severe and does not disrupt his sleep. He has a past history of sensitivity to poison ivy during his younger years but has not had any issues in his adult years. He was wearing gloves during the yard work and was aware of the presence of poison ivy in the area.         Review of Systems     Objective:   Physical Exam General Mental Status- Alert. General Appearance- Not in acute distress.   Skin On upper back 2 cm red indurated area with small break in middle portion where tick was attached. Left forearm- slight papular rash medial forearm.  Neck Carotid Arteries- Normal color. Moisture- Normal Moisture. No carotid bruits. No JVD.  Chest and Lung Exam Auscultation: Breath Sounds:-Normal.   Abdomen Inspection:-Inspeection Normal. Palpation/Percussion:Note:No mass. Palpation and Percussion of the abdomen reveal- Non Tender, Non Distended + BS, no rebound or guarding.   Neurologic Cranial Nerve exam:- CN III-XII intact(No nystagmus), symmetric  smile. Strength:- 5/5 equal and symmetric strength both upper and lower extremities.        Assessment & Plan:   Assessment and Plan    Tick Bite with concern skin infection: Tick attached for an estimated 3-4 days with a 2 cm indurated, erythematous area at the site of attachment. No systemic symptoms. Discussed the risk of Lyme disease and [facility] spotted fever and the limitations of early antibody testing. -Start Doxycycline for 10 days. -Follow-up in 10 days to assess resolution of skin changes.  Possible Poison Ivy Exposure: Mild pruritus on the left arm with a rash that developed after working in an area with suspected poison ivy. No significant discomfort or sleep disturbance. -Start Medrol Dosepak for 6 days. -Apply Kenalog topically.       Follow up 10 days or sooner if needed.  Esperanza Richters, PA-C

## 2022-07-28 ENCOUNTER — Encounter: Payer: Self-pay | Admitting: Medical

## 2022-07-28 ENCOUNTER — Ambulatory Visit (INDEPENDENT_AMBULATORY_CARE_PROVIDER_SITE_OTHER): Payer: 59 | Admitting: Medical

## 2022-07-28 VITALS — BP 110/80 | HR 59 | Temp 98.1°F | Resp 18 | Ht 72.0 in | Wt 167.6 lb

## 2022-07-28 DIAGNOSIS — F419 Anxiety disorder, unspecified: Secondary | ICD-10-CM

## 2022-07-28 DIAGNOSIS — W57XXXA Bitten or stung by nonvenomous insect and other nonvenomous arthropods, initial encounter: Secondary | ICD-10-CM

## 2022-07-28 DIAGNOSIS — S20462A Insect bite (nonvenomous) of left back wall of thorax, initial encounter: Secondary | ICD-10-CM

## 2022-07-28 NOTE — Progress Notes (Signed)
   Subjective:    Patient ID: Mark [name], male    DOB: August 11, 1960, 62 y.o.   MRN: [id number]  HPI Discussed the use of AI scribe software for clinical note transcription with the patient, who gave verbal consent to proceed.  History of Present Illness   follow up from last visit. The patient presented with a history of a tick bite on the left upper back, which was initially red and hard with a two-centimeter diameter area around it. He had removed the tick himself and was prescribed doxycycline. After completing the antibiotic course, the patient reported improvement in the area, with no itching and a faint scab where the tick was attached. He also reported no side effects from the medication.  In addition to the tick bite, the patient had a suspected poison ivy exposure presenting with a rash on the left forearm. The rash and associated itching cleared up within a couple of days after starting a six-day Medrol dose pack. The patient did not use any topical treatment.  The patient also has a history of anxiety, which is currently managed with Ativan 0.5mg , taken one to two times daily as needed. up to date on contract and uds. He reported needing to use less of the medication recently, suggesting improved control of his anxiety symptoms.  Lastly, the patient had received the first dose of the Shingrix vaccine during his last visit.        Review of Systems See hpi.    Objective:   Physical Exam  Physical Exam   SKIN: Left upper back shows a faint scab at the site of tick attachment, previously epidermis broken in that area, indicating healing. Initial presentation of tick bite site on left upper back was erythematous, slightly indurated, with a two centimeter diameter area around it. (now no redness or induration). Left forearm rash resolved, no pruritus.           Assessment & Plan:   Assessment and Plan    Tick Bite: Resolved with Doxycycline. No further signs of infection or  Lyme disease. -No further antibiotics needed.  Contact Dermatitis (suspected Poison Ivy): Resolved quickly with Medrol dose pack. No topical treatment used. -No further treatment needed.  Anxiety: Controlled with Ativan 0.5mg  1-2 times daily as needed. Patient reports needing less medication. -Continue Ativan as prescribed.  General Health Maintenance / Followup Plans: -Next controlled medication visit in 6 months. -Flu vaccine due in September. -Second dose of Shingrix vaccine due (1-6 months after first dose).       Esperanza Richters, PA-C

## 2022-07-28 NOTE — Patient Instructions (Signed)
Tick Bite: Resolved with Doxycycline. No further signs of infection or Lyme disease. -No further antibiotics needed.  Contact Dermatitis (suspected Poison Ivy): Resolved quickly with Medrol dose pack. No topical treatment used. -No further treatment needed.  Anxiety: Controlled with Ativan 0.5mg  1-2 times daily as needed. Patient reports needing less medication. -Continue Ativan as prescribed.  General Health Maintenance / Followup Plans: -Next controlled medication visit in 6 months. -Flu vaccine due in September. -Second dose of Shingrix vaccine due (1-6 months after first dose).

## 2022-08-22 ENCOUNTER — Encounter: Payer: Self-pay | Admitting: Neurology

## 2022-08-22 NOTE — Telephone Encounter (Signed)
See MyChart response.

## 2022-08-23 MED ORDER — ROPINIROLE HCL 4 MG PO TABS: 4.0000 mg | ORAL_TABLET | Freq: Every day | ORAL | 3 refills | Status: DC

## 2022-08-23 NOTE — Addendum Note (Signed)
Addended by: Huston Foley on: 08/23/2022 08:22 AM   Modules accepted: Orders

## 2022-08-25 ENCOUNTER — Other Ambulatory Visit: Payer: Self-pay | Admitting: Medical

## 2022-08-25 NOTE — Telephone Encounter (Signed)
Requesting: ativan Contract:12/12/2021 UDS:11/25/2021 Last Visit:07/28/22 Next Visit:n/a Last Refill:06/19/22  Please Advise

## 2022-08-28 ENCOUNTER — Encounter: Payer: Self-pay | Admitting: Medical

## 2022-09-11 ENCOUNTER — Telehealth: Payer: Self-pay | Admitting: Neurology

## 2022-09-11 MED ORDER — GABAPENTIN 300 MG PO CAPS: 300.0000 mg | ORAL_CAPSULE | Freq: Two times a day (BID) | ORAL | 5 refills | Status: DC

## 2022-09-11 NOTE — Telephone Encounter (Signed)
FYI _ my Chart note to patient , I offered to double the Gapapentin.

## 2022-09-12 NOTE — Telephone Encounter (Signed)
That would be fine 

## 2022-09-27 ENCOUNTER — Other Ambulatory Visit: Payer: Self-pay | Admitting: Medical

## 2022-09-27 NOTE — Telephone Encounter (Signed)
Requesting: ativan Contract:11/2021 UDS:11/2021 Last Visit:07/28/22 Next Visit:n/a Last Refill:08/27/22  Please Advise

## 2022-09-29 ENCOUNTER — Encounter: Payer: Self-pay | Admitting: Medical

## 2022-09-29 NOTE — Telephone Encounter (Signed)
 Requesting: ativan Contract:11/2021 UDS:11/2021 Last Visit:07/28/22 Next Visit:n/a Last Refill:08/27/22  Please Advise

## 2022-09-30 ENCOUNTER — Encounter: Payer: Self-pay | Admitting: Medical

## 2022-09-30 MED ORDER — LORAZEPAM 0.5 MG PO TABS: ORAL_TABLET | ORAL | 0 refills | Status: DC

## 2022-09-30 NOTE — Telephone Encounter (Signed)
Rx refill sent.

## 2022-10-02 ENCOUNTER — Other Ambulatory Visit: Payer: Self-pay | Admitting: Neurology

## 2022-10-19 ENCOUNTER — Other Ambulatory Visit: Payer: Self-pay | Admitting: Neurology

## 2022-10-19 DIAGNOSIS — G20A1 Parkinson's disease without dyskinesia, without mention of fluctuations: Secondary | ICD-10-CM

## 2022-10-25 NOTE — Progress Notes (Unsigned)
[facility] [address] [place]. [place] [address] [phone]       OFFICE FOLLOW UP NOTE  Mark [name] Date of Birth:  03-Feb-1961 Medical Record Number:  [id number]    Primary neurologist: Dr. Frances Furbish Reason for visit: Parkinson's disease    SUBJECTIVE:  CHIEF COMPLAINT:  No chief complaint on file.     HPI:   Update 10/26/2022 JM: Patient returns for follow-up visit regarding left sided predominant Parkinson's disease.   At prior visit, increased ropinirole to 3 mg nightly for RLS and restarted gabapentin 300 mg nightly for intermittent left-sided paresthesias.  Continued on Sinemet 1 pill 4 times daily.  During the interval time, ropinirole dosage increased to 4 mg and increased gabapentin to 600 mg nightly        History provided from Dr. Teofilo Pod prior OV note on 04/20/2022 for reference purposes only Mark [name] is a 62 year old right-handed gentleman with an underlying medical history of neck pain, reflux disease, chronic tinnitus, anxiety, and allergic rhinitis, who presents for follow-up consultation of his left-sided predominant Parkinson's disease with akinetic-rigid presentation.  The patient is accompanied by his wife today. I last saw him on 10/20/2021, at which time he reported feeling quite well.  He felt that the ropinirole was helping his restless legs.     Today, 04/20/2022: He reports feeling well.  His wife has noticed worsening of his speech difficulty first thing in the morning, he tends to mumble.  He has not fallen recently.  He exercises regularly and tries to stay active.  Constipation is under reasonable control with as needed use of MiraLAX.  He has increased his ropinirole a couple weeks ago to 3 pills at night which helps his leg twitching and restless leg symptoms.  He increased the ropinirole on his own.  Anxiety is under good control, he takes Lexapro daily and Ativan as needed.  He saw orthopedics for his trigger finger.   He tried a splint but no other intervention was done.   The patient's allergies, current medications, family history, past medical history, past social history, past surgical history and problem list were reviewed and updated as appropriate.    Previously     I saw him on 04/14/2021, at which time he was advised to continue with generic Sinemet 1 pill 3 times daily, and amantadine 1 pill twice daily, ropinirole 2 mg at bedtime and Lexapro 10 mg in the morning.  He was advised to start a trial of gabapentin for paresthesias, 300 mg at bedtime.  He emailed in the interim in April 2023 reporting that he was too drowsy from the gabapentin at 300 mg strength and we switched it back to 100 mg strength.  We increased it in the interim in August to 200 mg daily.  He requested a referral in the interim to physical therapy outpatient.     I saw him on 08/18/2020, at which time he felt fairly stable but his wife felt that he had become slower.  He had limited his driving to mostly just going to work.  He is taking ropinirole 3 times a day and exercising regularly.  He was on low-dose Lexapro 5 mg strength and on a slightly reduced dose of Ativan per PCP.  He was advised to start Sinemet at the time.  We talked about a referral to Dr. Rubin Payor for DBS evaluation.   He had an appointment with Dr. Rubin Payor at [facility] and I reviewed the office note from  12/15/2020.  He was felt to be a good candidate for DBS but since he was doing fairly well on medication regimen, further evaluation was held for surgical treatment of his Parkinson's disease.  He was advised to start amantadine 100 mg twice daily.     We had several email interactions in the interim.  In October 2022 we increased his Lexapro from 5 mg daily to 10 mg daily.  He also reported in the interim that he had more sleepiness from the ropinirole and had reduced it from 1 mg 3 times daily to 2 mg at bedtime only.     In February 2023, he reported having  difficulty sleeping.  He was advised to take amantadine in the morning and midday and try melatonin at night.    I saw him on 02/19/2020, at which time he felt fairly well.  He had completed therapy through neuro rehab and had benefited from therapy.  He did not tolerate the once daily long-acting ropinirole.  He was on Lexapro 5 mg once daily with good tolerance.  He was on ropinirole 1 mg strength 1-1/2 pills 3 times a day.  He felt it was helpful.  He was complaining of fatigue and exhaustion.  He is trying to stay active physically.  He was advised to gradually increase the ropinirole to eventually take 2 pills 3 times daily.       He missed an appointment on 02/10/20. I saw him on 11/12/2019, at which time he reported feeling better with regards to his Covid symptoms.  We mutually agreed to taper his Neupro patch, as he did not have much in the way of benefit from it.  For anxiety, he was advised to start Lexapro generic low-dose 5 mg strength once daily.  He was advised to start ropinirole immediate release.  He emailed back with the side effects on the ropinirole particularly worsening dizziness and sleepiness.  He was advised to change from immediate release ropinirole to long-acting ropinirole once daily 6 mg strength.     I saw him on 05/22/2019, at which time he reported that his dizziness had improved after he reduced the Neupro back to 6 mg daily from 8 mg daily.  He has had intermittent issues with anxiety.  Back stiffness was fairly stable, Flexeril as needed was helpful.  His wife was worried about his extensive exercise and that he would tire himself out.  We talked about moderation when he came to work schedule and exercising.  He reported some tingling on the left side and feeling a cold sensation in the left arm and hand intermittently.  He did not have any significant constipation or sleep issues.  He and his wife were worried about his EEG.  He had tried Viagra but had side effects.  He had  a prescription for Cialis but had not tried it.  I encouraged him to consider seeing a urologist and talk to his primary care provider about a referral, if the Cialis was not helpful. He was advised to continue with the Neupro patch 6 mg once daily.       I saw him on 01/21/2019, at which time he reported feeling better with regards to his restless leg symptoms.  He was taking melatonin at night, 5 mg strength.  He was noticing more stiffness and slowness.  I suggested we increase his Neupro to 8 mg daily.  He also reported back stiffness and back pain in the mid back area, was taking  ibuprofen as needed and I suggested a trial of Flexeril.  He emailed in the interim in January 2021, reporting some bouts of constipation.  He and his wife also emailed through [employer] last month reporting that he felt dizzy and lightheaded.  Eventually, we decided to reduce his Neupro patch back to 6 mg daily.     I saw him on 10/01/2018, at which time he reported feeling better after starting his medication.  He also was not using his Ativan as much.  He was working full-time, very active overall, walking a lot.  He had noticed some intermittent sensory symptoms, nothing sustained.  He was advised to continue with the Neupro patch 6 mg strength.   He emailed in the interim with increase in restless leg symptoms, he was advised to try gabapentin in September 2020.     I saw him on 07/02/2018 in virtual visit, at which time we mutually agreed to start him on Neupro patch.  We increased it in the recent past to 6 mg daily.  He emailed recently regarding some swelling he had noticed in the left upper and lower extremities.  He had an ultrasound of the left upper extremity which was negative for a blood clot.       I first met him on 04/03/2018 at the request of Dr. Pearletha Forge, at which time the patient reported a several month history, probably a 14-month history of left hand tremor, neck stiffness, difficulty with fine motor  control with the left upper extremity, and hand stiffness.  His history and physical examination were in keeping with parkinsonism on the left side.  We talked about symptomatic treatment options to be utilized soon, I suggested we proceed with a nuclear medicine DaTscan.  Due to the COVID-19 pandemic, his scan has not yet been done, he was approved for it though.   The patient sent a MyChart message in the interim in late March 2020 indicating that he has noticed more symptoms including some difficulty with his speech, including slurring, increased salivation, causing drooling at times and some symptoms in his right hand.     04/03/2018: (He) reports a left hand tremor for the past several months, likely 6-9 months. In addition, he has noted difficulty with fine motor control, he has noticed neck stiffness and left hand stiffness and difficulty performing fine motor skills with the left hand. I reviewed your office note from 01/17/2018. He has had radiating neck pain to the left arm. He has had physical therapy for this. His neck pain has improved. He fell in 2017 and had neck pain. He had a X ray neck on 03/26/17: IMPRESSION: No acute findings.   Mild spondylosis of the cervical spine with moderate to severe multilevel bilateral neural foraminal narrowing as described.   He denies a family history of Parkinson's disease or tremor. He denies any recent falls. He denies any constipation. Mood is fairly stable but he does admit to having a longer standing history of anxiety. He takes Ativan as needed for this. He also reports claustrophobia and having had difficulty going through an MRI in the past.   He has had some issues with becoming slower in his mobility. His brother recently commented on this. He is the youngest of 4 siblings, he has an older brother, then his sister, then another brother. He lives with his family which includes his wife and 94 year old daughter and 97 year old son. He works as a  Insurance claims handler. He is a nonsmoker and  drinks alcohol in the form of wine, maybe 3 glasses per week on average, caffeine in the form of coffee, 3 cups per day on average. He tries to hydrate well. He exercises regularly.      ROS:   14 system review of systems performed and negative with exception of ***  PMH:  Past Medical History:  Diagnosis Date   Family history of prostate cancer     DRE WNL; PSA never > 0.38   Fasting hyperglycemia     PMH of   GERD (gastroesophageal reflux disease)    Parkinson's disease    Tinnitus    w/o hearing loss    PSH:  Past Surgical History:  Procedure Laterality Date   COLONOSCOPY  05/2013   Neg, Dr Marina Goodell   KNEE SURGERY Right    TONSILLECTOMY AND ADENOIDECTOMY     VASECTOMY      Social History:  Social History   Socioeconomic History   Marital status: Married    Spouse name: Not on file   Number of children: Not on file   Years of education: Not on file   Highest education level: Associate degree: academic program  Occupational History   Not on file  Tobacco Use   Smoking status: Never   Smokeless tobacco: Never  Substance and Sexual Activity   Alcohol use: Yes    Comment: very rare wine , < one per month per pt.   Drug use: No   Sexual activity: Yes  Other Topics Concern   Not on file  Social History Narrative   Not on file   Social Determinants of Health   Financial Resource Strain: Low Risk  (05/16/2022)   Overall Financial Resource Strain (CARDIA)    Difficulty of Paying Living Expenses: Not hard at all  Food Insecurity: No Food Insecurity (05/16/2022)   Hunger Vital Sign    Worried About Running Out of Food in the Last Year: Never true    Ran Out of Food in the Last Year: Never true  Transportation Needs: No Transportation Needs (05/16/2022)   PRAPARE - Administrator, Civil Service (Medical): No    Lack of Transportation (Non-Medical): No  Physical Activity: Sufficiently Active (05/16/2022)   Exercise  Vital Sign    Days of Exercise per Week: 5 days    Minutes of Exercise per Session: 40 min  Stress: No Stress Concern Present (05/16/2022)   [employer] of Occupational Health - Occupational Stress Questionnaire    Feeling of Stress : Not at all  Social Connections: Moderately Integrated (05/16/2022)   Social Connection and Isolation Panel [NHANES]    Frequency of Communication with Friends and Family: Once a week    Frequency of Social Gatherings with Friends and Family: Once a week    Attends Religious Services: More than 4 times per year    Active Member of [employer] or Organizations: Yes    Attends Engineer, structural: More than 4 times per year    Marital Status: Married  Catering manager Violence: Not on file    Family History:  Family History  Problem Relation Age of Onset   COPD Mother        smoker   Prostate cancer Father 18       no longer with prostate cancer   Breast cancer Sister    COPD Paternal Aunt        smoker   Stroke Maternal Grandfather  late 60s   Heart disease Neg Hx    Colon cancer Neg Hx    Esophageal cancer Neg Hx    Pancreatic cancer Neg Hx    Rectal cancer Neg Hx    Stomach cancer Neg Hx    Diabetes Neg Hx    Other Neg Hx        low testosterone   Parkinson's disease Neg Hx     Medications:   Current Outpatient Medications on File Prior to Visit  Medication Sig Dispense Refill   carbidopa-levodopa (SINEMET IR) 25-100 MG tablet TAKE ONE TABLET BY MOUTH FOUR TIMES DAILY (AT 8AM, 12PM, 4PM, AND 8PM) 120 tablet 0   cyclobenzaprine (FLEXERIL) 5 MG tablet Take 1 tablet (5 mg total) by mouth at bedtime. 7 tablet 0   escitalopram (LEXAPRO) 10 MG tablet TAKE 1 TABLET BY MOUTH EVERY MORNING 30 tablet 1   gabapentin (NEURONTIN) 300 MG capsule Take 1 capsule (300 mg total) by mouth 2 (two) times daily. 60 capsule 5   Iron, Ferrous Sulfate, 325 (65 Fe) MG TABS Take 325 mg by mouth daily. 30 tablet 1   LORazepam (ATIVAN) 0.5 MG tablet  TAKE ONE TABLET BY MOUTH TWICE A DAY AS NEEDED FOR ANXIETY 45 tablet 0   methylPREDNISolone (MEDROL) 4 MG tablet Take 1 tablet (4 mg total) by mouth daily. 21 tablet 0   rOPINIRole (REQUIP) 4 MG tablet Take 1 tablet (4 mg total) by mouth at bedtime. 30 tablet 3   No current facility-administered medications on file prior to visit.    Allergies:   Allergies  Allergen Reactions   Penicillins     ? Reaction @ age 54      OBJECTIVE:  Physical Exam  There were no vitals filed for this visit. There is no height or weight on file to calculate BMI. No results found.     11/25/2021    3:10 PM  Depression screen PHQ 2/9  Decreased Interest 0  Down, Depressed, Hopeless 0  PHQ - 2 Score 0     General: well developed, well nourished, seated, in no evident distress Head: head normocephalic and atraumatic.   Neck: supple with no carotid or supraclavicular bruits Cardiovascular: regular rate and rhythm, no murmurs Musculoskeletal: no deformity Skin:  no rash/petichiae Vascular:  Normal pulses all extremities   Neurologic Exam Mental Status: Awake and fully alert. Oriented to place and time. Recent and remote memory intact. Attention span, concentration and fund of knowledge appropriate. Mood and affect appropriate.  Cranial Nerves: Pupils equal, briskly reactive to light. Extraocular movements full without nystagmus. Visual fields full to confrontation. Hearing intact. Facial sensation intact. Face, tongue, palate moves normally and symmetrically.  Motor: Normal bulk and tone. Normal strength in all tested extremity muscles Sensory.: intact to touch , pinprick , position and vibratory sensation.  Coordination: Rapid alternating movements normal in all extremities. Finger-to-nose and heel-to-shin performed accurately bilaterally. Gait and Station: Arises from chair without difficulty. Stance is normal. Gait demonstrates normal stride length and balance with ***. Tandem walk and heel toe  ***.  Reflexes: 1+ and symmetric. Toes downgoing.         ASSESSMENT/PLAN: Berny Agar is a 62 y.o. year old male with left sided predominant Parkinson's disease initially diagnosed in 2020      Parkinson's disease:  Continue Sinemet 1 tab 4 times daily Continue ropinirole 4 mg nightly for RLS Continue gabapentin 600 mg nightly for left-sided paresthesias     Follow up in ***  or call earlier if needed   CC:  PCP: Saguier, Kateri Mc    I spent *** minutes of face-to-face and non-face-to-face time with patient.  This included previsit chart review, lab review, study review, order entry, electronic health record documentation, patient education and discussion regarding above diagnoses and treatment plan and answered all other questions to patient's satisfaction    Ihor Austin, [facility]  [facility] [address] [place], [place] [address]  Phone [phone] Fax [phone] Note: This document was prepared with digital dictation and possible smart phrase technology. Any transcriptional errors that result from this process are unintentional.

## 2022-10-26 ENCOUNTER — Encounter: Payer: Self-pay | Admitting: Adult Health

## 2022-10-26 ENCOUNTER — Ambulatory Visit: Payer: 59 | Admitting: Adult Health

## 2022-10-26 VITALS — BP 121/70 | HR 71 | Ht 71.0 in | Wt 175.0 lb

## 2022-10-26 DIAGNOSIS — G20A1 Parkinson's disease without dyskinesia, without mention of fluctuations: Secondary | ICD-10-CM | POA: Diagnosis not present

## 2022-10-26 DIAGNOSIS — G20A2 Parkinson's disease without dyskinesia, with fluctuations: Secondary | ICD-10-CM

## 2022-10-26 MED ORDER — ROPINIROLE HCL 4 MG PO TABS: 4.0000 mg | ORAL_TABLET | Freq: Every day | ORAL | 11 refills | Status: DC

## 2022-10-26 MED ORDER — CARBIDOPA-LEVODOPA 25-100 MG PO TABS
1.0000 | ORAL_TABLET | Freq: Four times a day (QID) | ORAL | 11 refills | Status: DC
Start: 2022-10-26 — End: 2023-10-31

## 2022-10-26 NOTE — Patient Instructions (Addendum)
Your Plan:  Continue Sinemet 1 tab 4 times daily  Continue Requip 1 tab nightly - will discus possibly switching to a different medication with Dr. Frances Furbish - I will notify you via MyChart with her recommendations  Continue gabapentin 600mg  nightly   You will be called by [facility] for occupational therapy     Follow up in 6 months or call earlier if needed       Thank you for coming to see [place] at [facility]. I hope we have been able to provide you high quality care today.  You may receive a patient satisfaction survey over the next few weeks. We would appreciate your feedback and comments so that we may continue to improve ourselves and the health of our patients.

## 2022-10-28 ENCOUNTER — Other Ambulatory Visit: Payer: Self-pay | Admitting: Neurology

## 2022-10-30 ENCOUNTER — Telehealth: Payer: Self-pay | Admitting: Adult Health

## 2022-10-30 NOTE — Telephone Encounter (Signed)
Referral for occupational therapy fax to [facility]. Phone: [phone], Fax: [phone].

## 2022-11-01 ENCOUNTER — Other Ambulatory Visit: Payer: Self-pay | Admitting: Medical

## 2022-11-01 ENCOUNTER — Encounter: Payer: Self-pay | Admitting: Medical

## 2022-11-01 MED ORDER — LORAZEPAM 0.5 MG PO TABS: ORAL_TABLET | ORAL | 0 refills | Status: DC

## 2022-11-01 NOTE — Telephone Encounter (Signed)
Requesting: lorazepam 0.5mg   Contract: 12/13/21 UDS: 11/25/21 Last Visit: 07/28/22 Next Visit: None Last Refill: 09/30/22 #45 and 0RF   Please Advise

## 2022-11-01 NOTE — Telephone Encounter (Signed)
Last seen on 10/26/22 Follow up scheduled on 05/21/23

## 2022-11-01 NOTE — Addendum Note (Signed)
Addended by: Gwenevere Abbot on: 11/01/2022 05:23 PM   Modules accepted: Orders

## 2022-11-02 NOTE — Telephone Encounter (Signed)
Mychart message sent.

## 2022-11-02 NOTE — Telephone Encounter (Signed)
Can u cancel automatic script

## 2022-11-17 ENCOUNTER — Other Ambulatory Visit: Payer: Self-pay | Admitting: Neurology

## 2022-11-24 ENCOUNTER — Ambulatory Visit: Payer: 59 | Admitting: Medical

## 2022-11-24 VITALS — BP 130/78 | HR 57 | Temp 98.0°F | Resp 16 | Ht 71.0 in | Wt 173.2 lb

## 2022-11-24 DIAGNOSIS — G2581 Restless legs syndrome: Secondary | ICD-10-CM | POA: Diagnosis not present

## 2022-11-24 DIAGNOSIS — D649 Anemia, unspecified: Secondary | ICD-10-CM

## 2022-11-24 DIAGNOSIS — Z79899 Other long term (current) drug therapy: Secondary | ICD-10-CM | POA: Diagnosis not present

## 2022-11-24 DIAGNOSIS — F419 Anxiety disorder, unspecified: Secondary | ICD-10-CM | POA: Diagnosis not present

## 2022-11-24 LAB — CBC WITH DIFFERENTIAL/PLATELET
Basophils Absolute: 0 10*3/uL (ref 0.0–0.1)
Basophils Relative: 1 % (ref 0.0–3.0)
Eosinophils Absolute: 0.1 10*3/uL (ref 0.0–0.7)
Eosinophils Relative: 2.7 % (ref 0.0–5.0)
HCT: 41.2 % (ref 39.0–52.0)
Hemoglobin: 13 g/dL (ref 13.0–17.0)
Lymphocytes Relative: 30.8 % (ref 12.0–46.0)
Lymphs Abs: 1.4 10*3/uL (ref 0.7–4.0)
MCHC: 31.6 g/dL (ref 30.0–36.0)
MCV: 91.9 fL (ref 78.0–100.0)
Monocytes Absolute: 0.5 10*3/uL (ref 0.1–1.0)
Monocytes Relative: 11.5 % (ref 3.0–12.0)
Neutro Abs: 2.4 10*3/uL (ref 1.4–7.7)
Neutrophils Relative %: 54 % (ref 43.0–77.0)
Platelets: 168 10*3/uL (ref 150.0–400.0)
RBC: 4.48 Mil/uL (ref 4.22–5.81)
RDW: 14.6 % (ref 11.5–15.5)
WBC: 4.4 10*3/uL (ref 4.0–10.5)

## 2022-11-24 LAB — IRON: Iron: 82 ug/dL (ref 42–165)

## 2022-11-24 MED ORDER — LORAZEPAM 0.5 MG PO TABS: ORAL_TABLET | ORAL | 2 refills | Status: DC

## 2022-11-24 NOTE — Progress Notes (Signed)
Subjective:    Patient ID: Mark [name], male    DOB: [birth year], 62 y.o.   MRN: [id number]  HPI  The patient has anxiety, which is currently managed with Ativan 0.5mg , taken one to two times daily as needed. He go up dated  contract and uds today. He reported needing to use less of the medication recently, suggesting improved control of his anxiety symptoms.   Pt reports still exercising. He is doing some weight lifting.   Pt neurologist prescribed lexapro 10 mg daily.  On requip for restless leg.   Pt has mild anemia. Stool test negative for blood. Pt has been on iron. Will recheck today. Pt is up to date on colonoscopy.   Review of Systems  Constitutional:  Negative for chills, fatigue and fever.  Respiratory:  Negative for cough, chest tightness and wheezing.   Cardiovascular:  Negative for chest pain and palpitations.  Gastrointestinal:  Negative for abdominal pain, constipation, nausea and vomiting.  Genitourinary:  Negative for dysuria and flank pain.  Musculoskeletal:  Negative for back pain and myalgias.  Skin:  Negative for rash.  Neurological:  Negative for dizziness, numbness and headaches.  Hematological:  Negative for adenopathy. Does not bruise/bleed easily.  Psychiatric/Behavioral:  Negative for behavioral problems and decreased concentration.     Past Medical History:  Diagnosis Date   Family history of prostate cancer     DRE WNL; PSA never > 0.38   Fasting hyperglycemia     PMH of   GERD (gastroesophageal reflux disease)    Parkinson's disease    Tinnitus    w/o hearing loss     Social History   Socioeconomic History   Marital status: Married    Spouse name: Not on file   Number of children: Not on file   Years of education: Not on file   Highest education level: Associate degree: academic program  Occupational History   Not on file  Tobacco Use   Smoking status: Never   Smokeless tobacco: Never  Substance and Sexual Activity   Alcohol  use: Yes    Comment: very rare wine , < one per month per pt.   Drug use: No   Sexual activity: Yes  Other Topics Concern   Not on file  Social History Narrative   Not on file   Social Determinants of Health   Financial Resource Strain: Low Risk  (05/16/2022)   Overall Financial Resource Strain (CARDIA)    Difficulty of Paying Living Expenses: Not hard at all  Food Insecurity: No Food Insecurity (05/16/2022)   Hunger Vital Sign    Worried About Running Out of Food in the Last Year: Never true    Ran Out of Food in the Last Year: Never true  Transportation Needs: No Transportation Needs (05/16/2022)   PRAPARE - Administrator, Civil Service (Medical): No    Lack of Transportation (Non-Medical): No  Physical Activity: Sufficiently Active (05/16/2022)   Exercise Vital Sign    Days of Exercise per Week: 5 days    Minutes of Exercise per Session: 40 min  Stress: No Stress Concern Present (05/16/2022)   [employer] of Occupational Health - Occupational Stress Questionnaire    Feeling of Stress : Not at all  Social Connections: Moderately Integrated (05/16/2022)   Social Connection and Isolation Panel [NHANES]    Frequency of Communication with Friends and Family: Once a week    Frequency of Social Gatherings with Friends and Family: Once  a week    Attends Religious Services: More than 4 times per year    Active Member of Clubs or Organizations: Yes    Attends Banker Meetings: More than 4 times per year    Marital Status: Married  Catering manager Violence: Not on file    Past Surgical History:  Procedure Laterality Date   COLONOSCOPY  05/2013   Neg, Dr Marina Goodell   KNEE SURGERY Right    TONSILLECTOMY AND ADENOIDECTOMY     VASECTOMY      Family History  Problem Relation Age of Onset   COPD Mother        smoker   Prostate cancer Father 20       no longer with prostate cancer   Breast cancer Sister    COPD Paternal Aunt        smoker   Stroke Maternal  Grandfather         late 76s   Heart disease Neg Hx    Colon cancer Neg Hx    Esophageal cancer Neg Hx    Pancreatic cancer Neg Hx    Rectal cancer Neg Hx    Stomach cancer Neg Hx    Diabetes Neg Hx    Other Neg Hx        low testosterone   Parkinson's disease Neg Hx     Allergies  Allergen Reactions   Penicillins     ? Reaction @ age 12    Current Outpatient Medications on File Prior to Visit  Medication Sig Dispense Refill   carbidopa-levodopa (SINEMET IR) 25-100 MG tablet Take 1 tablet by mouth 4 (four) times daily. 120 tablet 11   escitalopram (LEXAPRO) 10 MG tablet TAKE 1 TABLET BY MOUTH EVERY MORNING 30 tablet 1   gabapentin (NEURONTIN) 300 MG capsule Take 1 capsule (300 mg total) by mouth 2 (two) times daily. 60 capsule 5   Iron, Ferrous Sulfate, 325 (65 Fe) MG TABS Take 325 mg by mouth daily. 30 tablet 1   LORazepam (ATIVAN) 0.5 MG tablet TAKE ONE TABLET BY MOUTH TWICE A DAY AS NEEDED FOR ANXIETY 45 tablet 0   LORazepam (ATIVAN) 0.5 MG tablet 1 tab po bid prn anxiety 45 tablet 0   rOPINIRole (REQUIP) 4 MG tablet Take 1 tablet (4 mg total) by mouth at bedtime. 30 tablet 11   No current facility-administered medications on file prior to visit.    BP 130/78 (BP Location: Left Arm, Patient Position: Sitting, Cuff Size: Normal)   Pulse (!) 57   Temp 98 F (36.7 C) (Oral)   Resp 16   Ht 5\' 11"  (1.803 m)   Wt 173 lb 3.2 oz (78.6 kg)   SpO2 100%   BMI 24.16 kg/m        Objective:   Physical Exam  General Mental Status- Alert. General Appearance- Not in acute distress.   Skin General: Color- Normal Color. Moisture- Normal Moisture.  Neck Carotid Arteries- Normal color. Moisture- Normal Moisture. No carotid bruits. No JVD.  Chest and Lung Exam Auscultation: Breath Sounds:-Normal.  Cardiovascular Auscultation:Rythm- Regular. Murmurs & Other Heart Sounds:Auscultation of the heart reveals- No Murmurs.  Abdomen Inspection:-Inspeection  Normal. Palpation/Percussion:Note:No mass. Palpation and Percussion of the abdomen reveal- Non Tender, Non Distended + BS, no rebound or guarding.   Neurologic Cranial Nerve exam:- CN III-XII intact(No nystagmus), symmetric smile. Strength:- 5/5 equal and symmetric strength both upper and lower extremities.       Assessment & Plan:  Patient Instructions  1. Anxiety -up dated contract and uds today. -sent rx ativan to pharmacy with refills. -continue lexapro.  2. Anemia, unspecified type -continue iron. Recheck cbc and iron level.  3. Restless leg -continue requip.   Ask your pharmicist to send [place] update on your flu, covid and shingrix vaccine date.  Follow 6 months or sooner if needed.       Esperanza Richters, PA-C

## 2022-11-24 NOTE — Patient Instructions (Addendum)
1. Anxiety(controlled. Doing well with below meds) -up dated contract and uds today. -sent rx ativan to pharmacy with refills. -continue lexapro.  2. Anemia, unspecified type -continue iron. Recheck cbc and iron level.  3. Restless leg -continue requip.   Ask your pharmicist to send [place] update on your flu, covid and shingrix vaccine date.  Follow 6 months or sooner if needed.

## 2022-11-26 LAB — DRUG MONITORING PANEL 376104, URINE
Alphahydroxyalprazolam: NEGATIVE ng/mL (ref <25)
Alphahydroxymidazolam: NEGATIVE ng/mL (ref <50)
Alphahydroxytriazolam: NEGATIVE ng/mL (ref <50)
Aminoclonazepam: NEGATIVE ng/mL (ref <25)
Benzodiazepines: POSITIVE ng/mL — AB (ref <100)
Cocaine Metabolite: NEGATIVE ng/mL (ref <150)
Desmethyltramadol: NEGATIVE ng/mL (ref <100)
Hydroxyethylflurazepam: NEGATIVE ng/mL (ref <50)
Lorazepam: 410 ng/mL — ABNORMAL HIGH (ref <50)
Nordiazepam: NEGATIVE ng/mL (ref <50)
Opiates: NEGATIVE ng/mL (ref <100)
Oxazepam: NEGATIVE ng/mL (ref <50)
Oxycodone: NEGATIVE ng/mL (ref <100)
Temazepam: NEGATIVE ng/mL (ref <50)
Tramadol Comments: NEGATIVE ng/mL (ref <300)
Tramadol: NEGATIVE ng/mL (ref <100)
Tramadol: NEGATIVE ng/mL (ref <500)
medMATCH Summary: NEGATIVE ng/mL (ref <100)

## 2022-11-26 LAB — DM TEMPLATE

## 2022-12-11 ENCOUNTER — Other Ambulatory Visit: Payer: Self-pay | Admitting: Neurology

## 2022-12-11 NOTE — Telephone Encounter (Signed)
Rx sent 

## 2022-12-18 ENCOUNTER — Other Ambulatory Visit: Payer: Self-pay | Admitting: Neurology

## 2022-12-19 ENCOUNTER — Encounter: Payer: Self-pay | Admitting: Neurology

## 2022-12-20 MED ORDER — ESCITALOPRAM OXALATE 10 MG PO TABS: 15.0000 mg | ORAL_TABLET | Freq: Every day | ORAL | 5 refills | Status: DC

## 2022-12-20 NOTE — Telephone Encounter (Signed)
Okay to increase Lexapro to 15 mg daily.  Rx sent to pharmacy.

## 2022-12-20 NOTE — Telephone Encounter (Signed)
Pt had increased dose, this prescription not needed

## 2022-12-20 NOTE — Addendum Note (Signed)
Addended by: Huston Foley on: 12/20/2022 06:08 PM   Modules accepted: Orders

## 2023-01-02 ENCOUNTER — Encounter: Payer: Self-pay | Admitting: Neurology

## 2023-01-17 ENCOUNTER — Other Ambulatory Visit: Payer: Self-pay | Admitting: Neurology

## 2023-02-14 ENCOUNTER — Encounter: Payer: Self-pay | Admitting: Neurology

## 2023-02-15 MED ORDER — GABAPENTIN 300 MG PO CAPS: 300.0000 mg | ORAL_CAPSULE | Freq: Two times a day (BID) | ORAL | 5 refills | Status: DC

## 2023-03-04 ENCOUNTER — Encounter: Payer: Self-pay | Admitting: Neurology

## 2023-03-04 DIAGNOSIS — G2581 Restless legs syndrome: Secondary | ICD-10-CM

## 2023-03-04 DIAGNOSIS — G20A2 Parkinson's disease without dyskinesia, with fluctuations: Secondary | ICD-10-CM

## 2023-03-05 NOTE — Telephone Encounter (Signed)
We had previously discussed this concern after his prior visit.  He is currently on Requip 4 mg nightly.  You had recommended adding controlled release Sinemet to help with these symptoms (please see prior OV note), would you still recommend this?

## 2023-03-06 ENCOUNTER — Other Ambulatory Visit: Payer: Self-pay | Admitting: Medical

## 2023-03-06 MED ORDER — CARBIDOPA-LEVODOPA ER 50-200 MG PO TBCR: 1.0000 | EXTENDED_RELEASE_TABLET | Freq: Every day | ORAL | 11 refills | Status: AC

## 2023-03-06 NOTE — Telephone Encounter (Signed)
Requesting: ativan Contract:12/18/22 UDS:11/24/22 Last Visit:11/24/22 Next Visit:n/a Last Refill:11/24/22  Please Advise

## 2023-03-06 NOTE — Telephone Encounter (Signed)
Rx refill ativan sent to pharmacy. Pt needs to have follow up with me in April. Please get him scheduled.

## 2023-03-06 NOTE — Telephone Encounter (Signed)
Pt has an appt for 05/25/23

## 2023-03-15 ENCOUNTER — Encounter: Payer: Self-pay | Admitting: Neurology

## 2023-03-15 DIAGNOSIS — M65342 Trigger finger, left ring finger: Secondary | ICD-10-CM

## 2023-03-19 ENCOUNTER — Encounter: Payer: Self-pay | Admitting: Adult Health

## 2023-03-20 ENCOUNTER — Telehealth: Payer: Self-pay | Admitting: Medical

## 2023-03-20 ENCOUNTER — Ambulatory Visit: Payer: Self-pay | Admitting: Medical

## 2023-03-20 NOTE — Telephone Encounter (Signed)
See message to pt.

## 2023-03-20 NOTE — Telephone Encounter (Signed)
Lvm for pt to call back for triage based on appt notes 2/7.

## 2023-03-20 NOTE — Telephone Encounter (Addendum)
 Copied from CRM [id number]. Topic: General - Other >> Mar 20, 2023 11:32 AM Benton O wrote: Reason for CRM: patient made a appointment online and the clinic called and lvm for triage   Chief Complaint: Swelling Symptoms: Mild swelling in left arm and left leg Pertinent Negatives: Patient denies pain  Disposition: [] ED /[] Urgent Care (no appt availability in office) / [x] Appointment(In office/virtual)/ []  [facility]/ [] Home Care/ [] Refused Recommended Disposition /[] [facility]/ []  Follow-up with PCP Additional Notes: Patient stated that he has been having mild swelling in the left arm and left leg for several months. He was diagnosed with Parkinsons and he thought that the swelling might be related. He reached out to his neurologist and they didn't feel like the swelling was due to Parkinsons and they recommended that he follow up with his PCP.  Patient denies pain. Appointment scheduled for 2/7.    Reason for Disposition  [1] MILD swelling of both ankles (i.e., pedal edema) AND [2] is a chronic symptom (recurrent or ongoing AND present > 4 weeks)  Answer Assessment - Initial Assessment Questions 1. ONSET: When did the swelling start? (e.g., minutes, hours, days)     Several months ago   2. LOCATION: What part of the leg is swollen?  Are both legs swollen or just one leg?     Left leg up to knee. Patient also has swelling in L hand up to shoulder  3. SEVERITY: How bad is the swelling? (e.g., localized; mild, moderate, severe)   - Localized: Small area of swelling localized to one leg.   - MILD pedal edema: Swelling limited to foot and ankle, pitting edema < 1/4 inch (6 mm) deep, rest and elevation eliminate most or all swelling.   - MODERATE edema: Swelling of lower leg to knee, pitting edema > 1/4 inch (6 mm) deep, rest and elevation only partially reduce swelling.   - SEVERE edema: Swelling extends above knee, facial or hand swelling present.      Mild  swelling  4. REDNESS: Does the swelling look red or infected?     No redness  5. PAIN: Is the swelling painful to touch? If Yes, ask: How painful is it?   (Scale 1-10; mild, moderate or severe)     No pain   6. FEVER: Do you have a fever? If Yes, ask: What is it, how was it measured, and when did it start?      No  7. CAUSE: What do you think is causing the leg swelling?     Though it was due to Parkinson's, but neurologist said it was unlikely  8. RECURRENT SYMPTOM: Have you had leg swelling before? If Yes, ask: When was the last time? What happened that time?     No  9. OTHER SYMPTOMS: Do you have any other symptoms? (e.g., chest pain, difficulty breathing)       No  Protocols used: Leg Swelling and Edema-A-AH

## 2023-03-21 ENCOUNTER — Telehealth: Payer: Self-pay | Admitting: Neurology

## 2023-03-21 NOTE — Telephone Encounter (Signed)
Referral for hand surgery fax to [facility]. Phone: [phone], Fax: [phone].

## 2023-03-23 ENCOUNTER — Ambulatory Visit (HOSPITAL_BASED_OUTPATIENT_CLINIC_OR_DEPARTMENT_OTHER)
Admission: RE | Admit: 2023-03-23 | Discharge: 2023-03-23 | Disposition: A | Discharge status: Home / Self Care | Payer: 59 | Source: Ambulatory Visit | Attending: Medical | Admitting: Medical

## 2023-03-23 ENCOUNTER — Other Ambulatory Visit (HOSPITAL_BASED_OUTPATIENT_CLINIC_OR_DEPARTMENT_OTHER): Payer: 59

## 2023-03-23 ENCOUNTER — Encounter: Payer: Self-pay | Admitting: Medical

## 2023-03-23 ENCOUNTER — Other Ambulatory Visit (HOSPITAL_BASED_OUTPATIENT_CLINIC_OR_DEPARTMENT_OTHER): Payer: Self-pay

## 2023-03-23 ENCOUNTER — Ambulatory Visit: Payer: 59 | Admitting: Medical

## 2023-03-23 VITALS — BP 120/76 | HR 72 | Resp 18 | Ht 71.0 in | Wt 173.4 lb

## 2023-03-23 DIAGNOSIS — I82402 Acute embolism and thrombosis of unspecified deep veins of left lower extremity: Secondary | ICD-10-CM | POA: Diagnosis not present

## 2023-03-23 DIAGNOSIS — M25512 Pain in left shoulder: Secondary | ICD-10-CM | POA: Diagnosis present

## 2023-03-23 DIAGNOSIS — M7989 Other specified soft tissue disorders: Secondary | ICD-10-CM

## 2023-03-23 DIAGNOSIS — F419 Anxiety disorder, unspecified: Secondary | ICD-10-CM | POA: Diagnosis not present

## 2023-03-23 DIAGNOSIS — R5383 Other fatigue: Secondary | ICD-10-CM

## 2023-03-23 MED ORDER — RIVAROXABAN (XARELTO) VTE STARTER PACK (15 & 20 MG)
ORAL_TABLET | ORAL | 0 refills | Status: DC
  Filled 2023-03-23: qty 51, 28d supply, fill #0

## 2023-03-23 NOTE — Addendum Note (Signed)
 Addended by: Serafina Damme on: 03/23/2023 09:03 PM   Modules accepted: Orders, Level of Service

## 2023-03-23 NOTE — Progress Notes (Addendum)
 Subjective:    Patient ID: Mark [name], male    DOB: 31-Aug-1960, 63 y.o.   MRN: [id number]  HPI Discussed the use of AI scribe software for clinical note transcription with the patient, who gave verbal consent to proceed.  History of Present Illness   Mark [name] is a 63 year old male with Parkinson's disease and arthritis who presents with left shoulder pain and crepitus. He was referred by his neurologist for evaluation of left arm symptoms.  He has been experiencing a sensation in his left shoulder for a long time, initially discussed with his neurologist, who did not believe it was related to Parkinson's disease. He describes occasional pain in his left shoulder, particularly when rolling it, and notes a 'crepitus, crunchy sound' when moving the arm. The pain is intermittent and occurs with certain movements, such as lifting his arm above his head or performing bench press exercises. He recalls an x-ray taken years ago by a sports orthopedics specialist, which indicated significant arthritis, but he has not had a recent shoulder x-ray. He is not currently taking any medication for the pain.  He reports swelling in his left upper arm and left calf, which he has noticed for a long time but without associated pain. He mentioned these symptoms to his neurologist, who did not find them concerning.  He mentions occasional neck pain, which has been ongoing for a while, and notes a previous cervical spine x-ray showing degenerative changes and nerve narrowing from C2 to C6. No constant neck pain is reported.  He experiences daily anxiety, which he manages with Ativan , taking 0.5 mg once or twice a day. He feels his anxiety may be related to his Parkinson's disease and has been using Ativan  more frequently than before. He is currently working full-time and has recently received additional help at work, which has alleviated some stress. Pt request increase of ativan  to 60 tab per month  instead of 45 tabs a month.          Review of Systems  Constitutional:  Negative for chills, fatigue and fever.  Respiratory:  Negative for cough, chest tightness, shortness of breath and wheezing.   Cardiovascular:  Negative for chest pain and palpitations.  Gastrointestinal:  Negative for abdominal pain, diarrhea and rectal pain.  Musculoskeletal:  Negative for back pain and myalgias.       Left shoulder pain.   Left arm and calf swelling.   Skin:  Negative for rash.  Psychiatric/Behavioral:  Negative for behavioral problems and dysphoric mood. The patient is not nervous/anxious.    Past Medical History:  Diagnosis Date   Family history of prostate cancer     DRE WNL; PSA never > 0.38   Fasting hyperglycemia     PMH of   GERD (gastroesophageal reflux disease)    Parkinson's disease    Tinnitus    w/o hearing loss     Social History   Socioeconomic History   Marital status: Married    Spouse name: Not on file   Number of children: Not on file   Years of education: Not on file   Highest education level: Associate degree: academic program  Occupational History   Not on file  Tobacco Use   Smoking status: Never   Smokeless tobacco: Never  Substance and Sexual Activity   Alcohol use: Yes    Comment: very rare wine , < one per month per pt.   Drug use: No   Sexual activity:  Yes  Other Topics Concern   Not on file  Social History Narrative   Not on file   Social Drivers of Health   Financial Resource Strain: Low Risk  (05/16/2022)   Overall Financial Resource Strain (CARDIA)    Difficulty of Paying Living Expenses: Not hard at all  Food Insecurity: No Food Insecurity (05/16/2022)   Hunger Vital Sign    Worried About Running Out of Food in the Last Year: Never true    Ran Out of Food in the Last Year: Never true  Transportation Needs: No Transportation Needs (05/16/2022)   PRAPARE - Administrator, Civil Service (Medical): No    Lack of Transportation  (Non-Medical): No  Physical Activity: Sufficiently Active (05/16/2022)   Exercise Vital Sign    Days of Exercise per Week: 5 days    Minutes of Exercise per Session: 40 min  Stress: No Stress Concern Present (05/16/2022)   [employer] of Occupational Health - Occupational Stress Questionnaire    Feeling of Stress : Not at all  Social Connections: Moderately Integrated (05/16/2022)   Social Connection and Isolation Panel [NHANES]    Frequency of Communication with Friends and Family: Once a week    Frequency of Social Gatherings with Friends and Family: Once a week    Attends Religious Services: More than 4 times per year    Active Member of [employer] or Organizations: Yes    Attends Banker Meetings: More than 4 times per year    Marital Status: Married  Catering Manager Violence: Not on file    Past Surgical History:  Procedure Laterality Date   COLONOSCOPY  05/2013   Neg, Dr Abran   KNEE SURGERY Right    TONSILLECTOMY AND ADENOIDECTOMY     VASECTOMY      Family History  Problem Relation Age of Onset   COPD Mother        smoker   Prostate cancer Father 29       no longer with prostate cancer   Breast cancer Sister    COPD Paternal Aunt        smoker   Stroke Maternal Grandfather         late 42s   Heart disease Neg Hx    Colon cancer Neg Hx    Esophageal cancer Neg Hx    Pancreatic cancer Neg Hx    Rectal cancer Neg Hx    Stomach cancer Neg Hx    Diabetes Neg Hx    Other Neg Hx        low testosterone    Parkinson's disease Neg Hx     Allergies  Allergen Reactions   Penicillins     ? Reaction @ age 64    Current Outpatient Medications on File Prior to Visit  Medication Sig Dispense Refill   carbidopa -levodopa  (SINEMET  CR) 50-200 MG tablet Take 1 tablet by mouth at bedtime. 30 tablet 11   carbidopa -levodopa  (SINEMET  IR) 25-100 MG tablet Take 1 tablet by mouth 4 (four) times daily. 120 tablet 11   escitalopram  (LEXAPRO ) 10 MG tablet Take 1.5 tablets  (15 mg total) by mouth daily. 45 tablet 5   gabapentin  (NEURONTIN ) 300 MG capsule Take 1 capsule (300 mg total) by mouth 2 (two) times daily. 60 capsule 5   Iron , Ferrous Sulfate , 325 (65 Fe) MG TABS Take 325 mg by mouth daily. 30 tablet 1   LORazepam  (ATIVAN ) 0.5 MG tablet 1 tab po bid prn anxiety 45  tablet 0   LORazepam  (ATIVAN ) 0.5 MG tablet TAKE ONE TABLET BY MOUTH TWICE A DAY AS NEEDED FOR ANXIETY 45 tablet 2   rOPINIRole  (REQUIP ) 4 MG tablet Take 1 tablet (4 mg total) by mouth at bedtime. 30 tablet 11   No current facility-administered medications on file prior to visit.    BP 120/76   Pulse 72   Resp 18   Ht 5' 11 (1.803 m)   Wt 173 lb 6.4 oz (78.7 kg)   SpO2 100%   BMI 24.18 kg/m        Objective:   Physical Exam  General Mental Status- Alert. General Appearance- Not in acute distress.   Skin General: Color- Normal Color. Moisture- Normal Moisture.  Neck Carotid Arteries- Normal color. Moisture- Normal Moisture. No carotid bruits. No JVD.  Chest and Lung Exam Auscultation: Breath Sounds:-Normal.  Cardiovascular Auscultation:Rythm- Regular. Murmurs & Other Heart Sounds:Auscultation of the heart reveals- No Murmurs.  Abdomen Inspection:-Inspeection Normal. Palpation/Percussion:Note:No mass. Palpation and Percussion of the abdomen reveal- Non Tender, Non Distended + BS, no rebound or guarding.   Neurologic Cranial Nerve exam:- CN III-XII intact(No nystagmus), symmetric smile. Strength:- 5/5 equal and symmetric strength both upper and lower extremities.   Left shoulder- mild pain on rom. No crepitus. Anterior aspect mild tender to palpation.  Left upper arm- possible mild enlarged compared to rt side. Forearms symmetric. Left arm not tender to palpation.  Left calf- mild enlarge compared to rt but neg homan sign. No edema. And not tender    Assessment & Plan:   Patient Instructions  Left Shoulder Pain Chronic pain with crepitus on movement, possibly  related to arthritis. No recent imaging available. -Order routine X-ray of left shoulder. -Advise use of low-dose ibuprofen and Tylenol as needed for pain. -Consider referral to sports medicine or orthopedics depending on X-ray results.  Left Upper and Lower Extremity Swelling Chronic, painless swelling of left arm and calf. No recent imaging available. -Order stat ultrasound of both upper and lower extremities to rule out DVT.  Anxiety Daily symptoms, currently managed with Ativan  0.5mg  as needed, up to twice daily. Patient reports increased usage recently. -Increase Ativan  prescription to 60 tablets per month. -Advise patient to notify provider when nearing end of prescription. -Anxiety maybe related to Parkinsons -up to date on contract and uds.  Parkinson's Disease Managed by neurologist, no new concerns discussed in this visit.  Arthritis Previously diagnosed, possibly contributing to shoulder pain. No new concerns discussed in this visit.  DVT Lt lower ext -got call back from radiology. Had pt come back upstairs.  Discuss US  findings. Answered question and explained tx. On review no dyspnea or chest pain. - placed referral to hematologist for further evaluation and treatment.  Follow up date to be determined after imaging review.   Willia Genrich, PA-C   Time spent with patient today was  45 minutes which consisted of chart review, discussing diagnosis, work up treatment and documentation.

## 2023-03-23 NOTE — Patient Instructions (Addendum)
 Left Shoulder Pain Chronic pain with crepitus on movement, possibly related to arthritis. No recent imaging available. -Order routine X-ray of left shoulder. -Advise use of low-dose ibuprofen and Tylenol as needed for pain. -Consider referral to sports medicine or orthopedics depending on X-ray results.  Left Upper and Lower Extremity Swelling Chronic, painless swelling of left arm and calf. No recent imaging available. -Order stat ultrasound of both upper and lower extremities to rule out DVT.  Anxiety Daily symptoms, currently managed with Ativan  0.5mg  as needed, up to twice daily. Patient reports increased usage recently. -Increase Ativan  prescription to 60 tablets per month. -Advise patient to notify provider when nearing end of prescription. -Anxiety maybe related to Parkinsons -up to date on contract and uds.  Parkinson's Disease Managed by neurologist, no new concerns discussed in this visit.  Arthritis Previously diagnosed, possibly contributing to shoulder pain. No new concerns discussed in this visit.  DVT Lt lower ext -got call back from radiology. Had pt come back upstairs.  Discuss US  findings. Answered question and explained tx. On review no dyspnea or chest pain. - placed referral to hematologist for further evaluation and treatment.  Follow up date to be determined after imaging review.

## 2023-03-23 NOTE — Addendum Note (Signed)
 Addended by: Serafina Damme on: 03/23/2023 05:36 PM   Modules accepted: Orders

## 2023-03-30 ENCOUNTER — Other Ambulatory Visit (HOSPITAL_BASED_OUTPATIENT_CLINIC_OR_DEPARTMENT_OTHER): Payer: Self-pay

## 2023-03-30 ENCOUNTER — Encounter: Payer: Self-pay | Admitting: Medical

## 2023-03-30 MED ORDER — LORAZEPAM 0.5 MG PO TABS
ORAL_TABLET | ORAL | 0 refills | Status: DC
  Filled 2023-03-30: qty 60, fill #0

## 2023-03-30 MED ORDER — LORAZEPAM 0.5 MG PO TABS: ORAL_TABLET | ORAL | 0 refills | Status: DC

## 2023-03-30 NOTE — Addendum Note (Signed)
Addended by: Gwenevere Abbot on: 03/30/2023 05:01 PM   Modules accepted: Orders

## 2023-03-30 NOTE — Addendum Note (Signed)
Addended by: Gwenevere Abbot on: 03/30/2023 04:58 PM   Modules accepted: Orders

## 2023-04-01 ENCOUNTER — Encounter: Payer: Self-pay | Admitting: Medical

## 2023-04-02 ENCOUNTER — Encounter: Payer: Self-pay | Admitting: Medical

## 2023-04-02 NOTE — Telephone Encounter (Signed)
 Read room stated they will get the results read shortly

## 2023-04-03 ENCOUNTER — Other Ambulatory Visit: Payer: Self-pay | Admitting: Family

## 2023-04-03 DIAGNOSIS — I82462 Acute embolism and thrombosis of left calf muscular vein: Secondary | ICD-10-CM

## 2023-04-04 ENCOUNTER — Encounter: Payer: Self-pay | Admitting: Family

## 2023-04-04 ENCOUNTER — Other Ambulatory Visit (HOSPITAL_BASED_OUTPATIENT_CLINIC_OR_DEPARTMENT_OTHER): Payer: Self-pay

## 2023-04-04 ENCOUNTER — Inpatient Hospital Stay: Payer: 59 | Attending: Hematology & Oncology

## 2023-04-04 ENCOUNTER — Inpatient Hospital Stay: Payer: 59 | Admitting: Family

## 2023-04-04 VITALS — BP 121/89 | HR 64 | Temp 97.6°F | Resp 17 | Ht 72.0 in | Wt 170.0 lb

## 2023-04-04 DIAGNOSIS — I82462 Acute embolism and thrombosis of left calf muscular vein: Secondary | ICD-10-CM

## 2023-04-04 DIAGNOSIS — I82402 Acute embolism and thrombosis of unspecified deep veins of left lower extremity: Secondary | ICD-10-CM

## 2023-04-04 DIAGNOSIS — Z8042 Family history of malignant neoplasm of prostate: Secondary | ICD-10-CM | POA: Diagnosis not present

## 2023-04-04 DIAGNOSIS — Z7901 Long term (current) use of anticoagulants: Secondary | ICD-10-CM | POA: Diagnosis not present

## 2023-04-04 DIAGNOSIS — Z803 Family history of malignant neoplasm of breast: Secondary | ICD-10-CM | POA: Insufficient documentation

## 2023-04-04 LAB — CBC WITH DIFFERENTIAL (CANCER CENTER ONLY)
Abs Immature Granulocytes: 0.01 10*3/uL (ref 0.00–0.07)
Basophils Absolute: 0 10*3/uL (ref 0.0–0.1)
Basophils Relative: 1 %
Eosinophils Absolute: 0.1 10*3/uL (ref 0.0–0.5)
Eosinophils Relative: 3 %
HCT: 40.9 % (ref 39.0–52.0)
Hemoglobin: 12.7 g/dL — ABNORMAL LOW (ref 13.0–17.0)
Immature Granulocytes: 0 %
Lymphocytes Relative: 31 %
Lymphs Abs: 1.1 10*3/uL (ref 0.7–4.0)
MCH: 27.4 pg (ref 26.0–34.0)
MCHC: 31.1 g/dL (ref 30.0–36.0)
MCV: 88.3 fL (ref 80.0–100.0)
Monocytes Absolute: 0.4 10*3/uL (ref 0.1–1.0)
Monocytes Relative: 13 %
Neutro Abs: 1.8 10*3/uL (ref 1.7–7.7)
Neutrophils Relative %: 52 %
Platelet Count: 169 10*3/uL (ref 150–400)
RBC: 4.63 MIL/uL (ref 4.22–5.81)
RDW: 13.9 % (ref 11.5–15.5)
WBC Count: 3.5 10*3/uL — ABNORMAL LOW (ref 4.0–10.5)
nRBC: 0 % (ref 0.0–0.2)

## 2023-04-04 LAB — CMP (CANCER CENTER ONLY)
ALT: 5 U/L (ref 0–44)
AST: 19 U/L (ref 15–41)
Albumin: 4.7 g/dL (ref 3.5–5.0)
Alkaline Phosphatase: 59 U/L (ref 38–126)
Anion gap: 7 (ref 5–15)
BUN: 14 mg/dL (ref 6–20)
CO2: 31 mmol/L (ref 22–32)
Calcium: 10.1 mg/dL (ref 8.9–10.3)
Chloride: 102 mmol/L (ref 98–111)
Creatinine: 0.94 mg/dL (ref 0.61–1.24)
GFR, Estimated: >60 mL/min (ref >60)
Glucose, Bld: 93 mg/dL (ref 70–99)
Potassium: 4 mmol/L (ref 3.5–5.1)
Sodium: 140 mmol/L (ref 135–145)
Total Bilirubin: 0.7 mg/dL (ref 0.0–1.2)
Total Protein: 7.6 g/dL (ref 6.5–8.1)

## 2023-04-04 LAB — ANTITHROMBIN III: AntiThromb III Func: 104 % (ref 75–120)

## 2023-04-04 MED ORDER — RIVAROXABAN 20 MG PO TABS
20.0000 mg | ORAL_TABLET | Freq: Every day | ORAL | 5 refills | Status: DC
  Filled 2023-04-04: qty 30, 30d supply, fill #0

## 2023-04-04 NOTE — Progress Notes (Signed)
 Hematology/Oncology Consultation   Name: Mark [name]      MRN: [id number]    Location: Room/bed info not found  Date: 04/04/2023 Time:10:59 AM   REFERRING PHYSICIAN: Esperanza Richters, PA-C  REASON FOR CONSULT: LLE DVT   DIAGNOSIS: LLE DVT  HISTORY OF PRESENT ILLNESS: Mr. Altland is a pleasant 63 yo caucasian gentleman with recent diagnosis of LLE DVT.  He denies illness, injury or travel at the time.  He states that he may have gotten a little dehydrated.  No prior history of thrombotic event.  No known familial history of thrombotic event.  He states that he does drink a lot of coffee.  He is not on testosterone therapy.  No smoking, ETOH or recreational drug use.  He is tolerating Xarelto nicely. No issues with bleeding, bruising or petechiae.  Pain and swelling in his left leg has almost resolved. Pedal pulses are 1+.  He has a son and a daughter.  Colonoscopy in 05/2013 with Dr. Marina Goodell was normal.  No personal or known familial history of cancer.  No diabetes or thyroid disease.  No issues with frequent or recurrent infections.  Surgical history of tonsillectomy at age 63.  No fever, chills, n/v, cough, rash, dizziness, SOB, chest pain, palpitations, abdominal pain or changes in bladder habits.  He has had some mild constipation since starting Xarelto.  No numbness or tingling in his extremities.  No falls or syncope reported.  He exercises 5-6 days a week. He enjoys riding a stationary bicycle.  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   ROS: All other 10 point review of systems is negative.   PAST MEDICAL HISTORY:   Past Medical History:  Diagnosis Date   Family history of prostate cancer     DRE WNL; PSA never > 0.38   Fasting hyperglycemia     PMH of   GERD (gastroesophageal reflux disease)    Parkinson's disease    Tinnitus    w/o hearing loss    ALLERGIES: Allergies  Allergen Reactions   Penicillins     ? Reaction @ age 73       MEDICATIONS:  Current Outpatient Medications on File Prior to Visit  Medication Sig Dispense Refill   carbidopa-levodopa (SINEMET CR) 50-200 MG tablet Take 1 tablet by mouth at bedtime. 30 tablet 11   carbidopa-levodopa (SINEMET IR) 25-100 MG tablet Take 1 tablet by mouth 4 (four) times daily. 120 tablet 11   escitalopram (LEXAPRO) 10 MG tablet Take 1.5 tablets (15 mg total) by mouth daily. 45 tablet 5   gabapentin (NEURONTIN) 300 MG capsule Take 1 capsule (300 mg total) by mouth 2 (two) times daily. 60 capsule 5   Iron, Ferrous Sulfate, 325 (65 Fe) MG TABS Take 325 mg by mouth daily. 30 tablet 1   LORazepam (ATIVAN) 0.5 MG tablet TAKE ONE TABLET BY MOUTH TWICE A DAY AS NEEDED FOR ANXIETY 60 tablet 0   RIVAROXABAN (XARELTO) VTE STARTER PACK (15 & 20 MG) Follow package directions: Take one 15mg  tablet by mouth twice a day. On day 22, switch to one 20mg  tablet once a day. Take with food. 51 each 0   rOPINIRole (REQUIP) 4 MG tablet Take 1 tablet (4 mg total) by mouth at bedtime. 30 tablet 11   No current facility-administered medications on file prior to visit.     PAST SURGICAL HISTORY Past Surgical History:  Procedure Laterality Date   COLONOSCOPY  05/2013  Neg, Dr Marina Goodell   KNEE SURGERY Right    TONSILLECTOMY AND ADENOIDECTOMY     VASECTOMY      FAMILY HISTORY: Family History  Problem Relation Age of Onset   COPD Mother        smoker   Prostate cancer Father 67       no longer with prostate cancer   Breast cancer Sister    COPD Paternal Aunt        smoker   Stroke Maternal Grandfather         late 61s   Heart disease Neg Hx    Colon cancer Neg Hx    Esophageal cancer Neg Hx    Pancreatic cancer Neg Hx    Rectal cancer Neg Hx    Stomach cancer Neg Hx    Diabetes Neg Hx    Other Neg Hx        low testosterone   Parkinson's disease Neg Hx     SOCIAL HISTORY:  reports that he has never smoked. He has never used smokeless tobacco. He reports current alcohol use. He  reports that he does not use drugs.  PERFORMANCE STATUS: The patient's performance status is 1 - Symptomatic but completely ambulatory  PHYSICAL EXAM: Most Recent Vital Signs: There were no vitals taken for this visit. BP 121/89 (BP Location: Right Arm, Patient Position: Sitting, Cuff Size: Normal)   Pulse 64   Temp 97.6 F (36.4 C) (Oral)   Resp 17   Ht 6' (1.829 m)   Wt 170 lb (77.1 kg)   SpO2 100%   BMI 23.06 kg/m   General Appearance:    Alert, cooperative, no distress, appears stated age  Head:    Normocephalic, without obvious abnormality, atraumatic  Eyes:    PERRL, conjunctiva/corneas clear, EOM's intact, fundi    benign, both eyes             Throat:   Lips, mucosa, and tongue normal; teeth and gums normal  Neck:   Supple, symmetrical, trachea midline, no adenopathy;       thyroid:  No enlargement/tenderness/nodules; no carotid   bruit or JVD  Back:     Symmetric, no curvature, ROM normal, no CVA tenderness  Lungs:     Clear to auscultation bilaterally, respirations unlabored  Chest wall:    No tenderness or deformity  Heart:    Regular rate and rhythm, S1 and S2 normal, no murmur, rub   or gallop  Abdomen:     Soft, non-tender, bowel sounds active all four quadrants,    no masses, no organomegaly        Extremities:   Extremities normal, atraumatic, no cyanosis or edema  Pulses:   2+ and symmetric all extremities  Skin:   Skin color, texture, turgor normal, no rashes or lesions  Lymph nodes:   Cervical, supraclavicular, and axillary nodes normal  Neurologic:   CNII-XII intact. Normal strength, sensation and reflexes      throughout    LABORATORY DATA:  Results for orders placed or performed in visit on 04/04/23 (from the past 48 hours)  CBC with Differential (Cancer Center Only)     Status: Abnormal   Collection Time: 04/04/23 10:39 AM  Result Value Ref Range   WBC Count 3.5 (L) 4.0 - 10.5 K/uL   RBC 4.63 4.22 - 5.81 MIL/uL   Hemoglobin 12.7 (L) 13.0 -  17.0 g/dL   HCT 16.1 09.6 - 04.5 %   MCV 88.3 80.0 -  100.0 fL   MCH 27.4 26.0 - 34.0 pg   MCHC 31.1 30.0 - 36.0 g/dL   RDW 16.1 09.6 - 04.5 %   Platelet Count 169 150 - 400 K/uL   nRBC 0.0 0.0 - 0.2 %   Neutrophils Relative % 52 %   Neutro Abs 1.8 1.7 - 7.7 K/uL   Lymphocytes Relative 31 %   Lymphs Abs 1.1 0.7 - 4.0 K/uL   Monocytes Relative 13 %   Monocytes Absolute 0.4 0.1 - 1.0 K/uL   Eosinophils Relative 3 %   Eosinophils Absolute 0.1 0.0 - 0.5 K/uL   Basophils Relative 1 %   Basophils Absolute 0.0 0.0 - 0.1 K/uL   Immature Granulocytes 0 %   Abs Immature Granulocytes 0.01 0.00 - 0.07 K/uL    Comment: Performed at [facility] at [facility], [address], [place], [place] [address]      RADIOGRAPHY: No results found.     PATHOLOGY: None   ASSESSMENT/PLAN: Mr. Brinegar is a pleasant 63 yo caucasian gentleman with recent diagnosis of LLE DVT.  Hyper coag panel is pending.  We will repeat an US of the left leg a few days prior to his follow-up in 3 months.  Xarelto refilled.   All questions were answered. The patient knows to call the clinic with any problems, questions or concerns. We can certainly see the patient much sooner if necessary.  The patient was discussed with Dr. Myna Hidalgo and he is in agreement with the aforementioned.   Eileen Stanford

## 2023-04-05 ENCOUNTER — Encounter: Payer: Self-pay | Admitting: Family

## 2023-04-05 LAB — LUPUS ANTICOAGULANT PANEL
DRVVT: 63.2 s — ABNORMAL HIGH (ref 0.0–47.0)
PTT Lupus Anticoagulant: 40.5 s (ref 0.0–43.5)

## 2023-04-05 LAB — PROTEIN C ACTIVITY: Protein C Activity: 125 % (ref 73–180)

## 2023-04-05 LAB — BETA-2-GLYCOPROTEIN I ABS, IGG/M/A
Beta-2 Glyco I IgG: <9 GPI IgG units (ref 0–20)
Beta-2-Glycoprotein I IgA: <9 GPI IgA units (ref 0–25)
Beta-2-Glycoprotein I IgM: <9 GPI IgM units (ref 0–32)

## 2023-04-05 LAB — PROTEIN S, TOTAL: Protein S Ag, Total: 97 % (ref 60–150)

## 2023-04-05 LAB — HOMOCYSTEINE: Homocysteine: 10.8 umol/L (ref 0.0–14.5)

## 2023-04-05 LAB — PROTEIN C, TOTAL: Protein C, Total: 109 % (ref 60–150)

## 2023-04-05 LAB — PROTEIN S ACTIVITY: Protein S Activity: 100 % (ref 63–140)

## 2023-04-05 LAB — DRVVT CONFIRM: dRVVT Confirm: 1.2 {ratio} (ref 0.8–1.2)

## 2023-04-05 LAB — DRVVT MIX: dRVVT Mix: 50.7 s — ABNORMAL HIGH (ref 0.0–40.4)

## 2023-04-06 LAB — CARDIOLIPIN ANTIBODIES, IGG, IGM, IGA
Anticardiolipin IgA: <9 [APL'U]/mL (ref 0–11)
Anticardiolipin IgG: 11 [GPL'U]/mL (ref 0–14)
Anticardiolipin IgM: <9 [MPL'U]/mL (ref 0–12)

## 2023-04-09 LAB — PROTHROMBIN GENE MUTATION

## 2023-04-10 ENCOUNTER — Encounter: Payer: Self-pay | Admitting: Family

## 2023-04-10 ENCOUNTER — Telehealth: Payer: Self-pay | Admitting: Family

## 2023-04-10 LAB — FACTOR 5 LEIDEN

## 2023-04-10 NOTE — Telephone Encounter (Signed)
 Left voice mail with call back number to go over positive Factor V Leiden lab work as well as his questions about leg swelling a walking with his job. Will try again tomorrow if we do not hear back.

## 2023-04-11 ENCOUNTER — Telehealth: Payer: Self-pay | Admitting: Family

## 2023-04-11 NOTE — Telephone Encounter (Signed)
 I was able to speak with the patient and let him know that he is heterozygous for the Factor V Leiden mutation. He has no questions or concerns at this time. He was appreciative of the call.

## 2023-04-13 ENCOUNTER — Encounter: Payer: Self-pay | Admitting: Medical

## 2023-04-13 ENCOUNTER — Other Ambulatory Visit: Payer: Self-pay | Admitting: *Deleted

## 2023-04-13 ENCOUNTER — Other Ambulatory Visit (HOSPITAL_BASED_OUTPATIENT_CLINIC_OR_DEPARTMENT_OTHER): Payer: Self-pay

## 2023-04-13 MED ORDER — RIVAROXABAN 20 MG PO TABS
20.0000 mg | ORAL_TABLET | Freq: Every day | ORAL | 5 refills | Status: DC
  Filled 2023-04-16: qty 30, 30d supply, fill #0
  Filled 2023-05-20: qty 30, 30d supply, fill #1
  Filled 2023-06-16: qty 30, 30d supply, fill #2
  Filled 2023-07-18: qty 30, 30d supply, fill #3
  Filled 2023-08-20: qty 30, 30d supply, fill #4
  Filled 2023-09-19: qty 30, 30d supply, fill #5

## 2023-04-16 ENCOUNTER — Other Ambulatory Visit (HOSPITAL_BASED_OUTPATIENT_CLINIC_OR_DEPARTMENT_OTHER): Payer: Self-pay

## 2023-04-17 ENCOUNTER — Encounter: Payer: Self-pay | Admitting: Family

## 2023-04-23 ENCOUNTER — Telehealth: Payer: Self-pay

## 2023-04-23 NOTE — Telephone Encounter (Signed)
Surgical clearance form placed in red folder

## 2023-04-27 ENCOUNTER — Encounter: Payer: Self-pay | Admitting: Medical

## 2023-04-28 NOTE — Addendum Note (Signed)
 Addended by: Gwenevere Abbot on: 04/28/2023 11:07 PM   Modules accepted: Orders

## 2023-05-03 ENCOUNTER — Encounter: Payer: Self-pay | Admitting: Adult Health

## 2023-05-04 ENCOUNTER — Telehealth: Payer: Self-pay

## 2023-05-04 NOTE — Telephone Encounter (Signed)
 Copied from CRM [id number]. Topic: Clinical - Request for Lab/Test Order >> May 04, 2023 10:54 AM Florestine Avers wrote: Reason for CRM: Steward Drone at the hand center called in wanting to know if the surgical clearence forms she faxed over has been completed. She is requesting a follow up phone call with this information and can be reached at [phone].

## 2023-05-04 NOTE — Telephone Encounter (Signed)
 Spoke with Steward Drone , asked for forms to be refaxed

## 2023-05-06 ENCOUNTER — Encounter: Payer: Self-pay | Admitting: Medical

## 2023-05-06 ENCOUNTER — Encounter: Payer: Self-pay | Admitting: Adult Health

## 2023-05-07 NOTE — Telephone Encounter (Signed)
FMLA received.

## 2023-05-08 NOTE — Telephone Encounter (Signed)
 Pt is wanting to wait until appt on 05/25/23 to get preop completed

## 2023-05-17 ENCOUNTER — Ambulatory Visit: Admitting: Medical

## 2023-05-17 VITALS — BP 118/76 | HR 62 | Temp 97.9°F | Resp 18 | Ht 72.0 in | Wt 167.0 lb

## 2023-05-17 DIAGNOSIS — R5383 Other fatigue: Secondary | ICD-10-CM | POA: Diagnosis not present

## 2023-05-17 DIAGNOSIS — Z86718 Personal history of other venous thrombosis and embolism: Secondary | ICD-10-CM | POA: Diagnosis not present

## 2023-05-17 DIAGNOSIS — Z01818 Encounter for other preprocedural examination: Secondary | ICD-10-CM | POA: Diagnosis not present

## 2023-05-17 DIAGNOSIS — R001 Bradycardia, unspecified: Secondary | ICD-10-CM

## 2023-05-17 LAB — CBC WITH DIFFERENTIAL/PLATELET
Basophils Absolute: 0 10*3/uL (ref 0.0–0.1)
Basophils Relative: 0.7 % (ref 0.0–3.0)
Eosinophils Absolute: 0.1 10*3/uL (ref 0.0–0.7)
Eosinophils Relative: 2.2 % (ref 0.0–5.0)
HCT: 40.9 % (ref 39.0–52.0)
Hemoglobin: 13.4 g/dL (ref 13.0–17.0)
Lymphocytes Relative: 29.1 % (ref 12.0–46.0)
Lymphs Abs: 0.9 10*3/uL (ref 0.7–4.0)
MCHC: 32.8 g/dL (ref 30.0–36.0)
MCV: 88.5 fl (ref 78.0–100.0)
Monocytes Absolute: 0.4 10*3/uL (ref 0.1–1.0)
Monocytes Relative: 12.4 % — ABNORMAL HIGH (ref 3.0–12.0)
Neutro Abs: 1.8 10*3/uL (ref 1.4–7.7)
Neutrophils Relative %: 55.6 % (ref 43.0–77.0)
Platelets: 147 10*3/uL — ABNORMAL LOW (ref 150.0–400.0)
RBC: 4.61 Mil/uL (ref 4.22–5.81)
RDW: 18.4 % — ABNORMAL HIGH (ref 11.5–15.5)
WBC: 3.2 10*3/uL — ABNORMAL LOW (ref 4.0–10.5)

## 2023-05-17 LAB — COMPREHENSIVE METABOLIC PANEL WITH GFR
ALT: 7 U/L (ref 0–53)
AST: 20 U/L (ref 0–37)
Albumin: 4.5 g/dL (ref 3.5–5.2)
Alkaline Phosphatase: 55 U/L (ref 39–117)
BUN: 16 mg/dL (ref 6–23)
CO2: 32 meq/L (ref 19–32)
Calcium: 9.5 mg/dL (ref 8.4–10.5)
Chloride: 101 meq/L (ref 96–112)
Creatinine, Ser: 1 mg/dL (ref 0.40–1.50)
GFR: 81.84 mL/min (ref >60.00)
Glucose, Bld: 90 mg/dL (ref 70–99)
Potassium: 5 meq/L (ref 3.5–5.1)
Sodium: 138 meq/L (ref 135–145)
Total Bilirubin: 0.7 mg/dL (ref 0.2–1.2)
Total Protein: 7 g/dL (ref 6.0–8.3)

## 2023-05-17 LAB — VITAMIN B12: Vitamin B-12: 390 pg/mL (ref 211–911)

## 2023-05-17 LAB — T4, FREE: Free T4: 0.85 ng/dL (ref 0.60–1.60)

## 2023-05-17 LAB — TSH: TSH: 1 u[IU]/mL (ref 0.35–5.50)

## 2023-05-17 NOTE — Progress Notes (Addendum)
 Subjective:    Patient ID: Mark [name], male    DOB: 01/19/1961, 63 y.o.   MRN: [id number]  HPI Mark [name] "Mark [name]" is a 63 year old male who presents for a pre-operative assessment for left ring finger surgery.  He is scheduled for a surgical procedure on April 24th to address a swan neck deformity of the left ring finger, which will be performed under general anesthesia. He is undergoing a pre-operative assessment to ensure fitness for surgery.  He has a history of deep vein thrombosis (DVT) and is currently on Xarelto. He is uncertain about the protocol for discontinuing Xarelto prior to the surgery and when to resume it post-operatively.  He experiences fatigue and has considered taking over-the-counter supplements. He has not yet undergone any specific lab work for this issue, but plans to have labs done as part of the pre-operative evaluation.  He engages in regular physical activity, including walking and jogging five days a week, and lifting weights three days a week. He monitors his heart rate with a smartwatch, noting it reaches 98 to 120 beats per minute during exercise. No chest pain or other cardiac symptoms.    Review of Systems  Constitutional:  Positive for fatigue. Negative for chills and fever.  HENT:  Negative for congestion, drooling and ear discharge.   Respiratory:  Negative for cough, chest tightness, shortness of breath and wheezing.   Cardiovascular:  Negative for chest pain and palpitations.  Gastrointestinal:  Negative for abdominal pain, blood in stool and constipation.  Genitourinary:  Negative for dysuria and frequency.  Musculoskeletal:  Negative for back pain, myalgias and neck stiffness.  Skin:  Negative for rash.  Neurological:  Negative for dizziness, seizures and headaches.  Hematological:  Negative for adenopathy. Does not bruise/bleed easily.  Psychiatric/Behavioral:  Negative for behavioral problems and decreased concentration.    Past  Medical History:  Diagnosis Date   Family history of prostate cancer     DRE WNL; PSA never > 0.38   Fasting hyperglycemia     PMH of   GERD (gastroesophageal reflux disease)    Parkinson's disease (HCC)    Tinnitus    w/o hearing loss     Social History   Socioeconomic History   Marital status: Married    Spouse name: Not on file   Number of children: Not on file   Years of education: Not on file   Highest education level: Associate degree: academic program  Occupational History   Not on file  Tobacco Use   Smoking status: Never   Smokeless tobacco: Never  Substance and Sexual Activity   Alcohol use: Yes    Comment: very rare wine , < one per month per pt.   Drug use: No   Sexual activity: Yes  Other Topics Concern   Not on file  Social History Narrative   Not on file   Social Drivers of Health   Financial Resource Strain: Low Risk  (05/16/2022)   Overall Financial Resource Strain (CARDIA)    Difficulty of Paying Living Expenses: Not hard at all  Food Insecurity: Low Risk  (04/20/2023)   Received from [facility]   Hunger Vital Sign    Worried About Running Out of Food in the Last Year: Never true    Ran Out of Food in the Last Year: Never true  Transportation Needs: No Transportation Needs (04/20/2023)   Received from [facility]    In the past 12 months,  has lack of reliable transportation kept you from medical appointments, meetings, work or from getting things needed for daily living? : No  Physical Activity: Sufficiently Active (05/16/2022)   Exercise Vital Sign    Days of Exercise per Week: 5 days    Minutes of Exercise per Session: 40 min  Stress: No Stress Concern Present (05/16/2022)   [employer] of Occupational Health - Occupational Stress Questionnaire    Feeling of Stress : Not at all  Social Connections: Moderately Integrated (05/16/2022)   Social Connection and Isolation Panel [NHANES]    Frequency of Communication with Friends  and Family: Once a week    Frequency of Social Gatherings with Friends and Family: Once a week    Attends Religious Services: More than 4 times per year    Active Member of [employer] or Organizations: Yes    Attends Banker Meetings: More than 4 times per year    Marital Status: Married  Catering manager Violence: Not At Risk (04/04/2023)   Humiliation, Afraid, Rape, and Kick questionnaire    Fear of Current or Ex-Partner: No    Emotionally Abused: No    Physically Abused: No    Sexually Abused: No    Past Surgical History:  Procedure Laterality Date   COLONOSCOPY  05/2013   Neg, Dr Marina Goodell   KNEE SURGERY Right    TONSILLECTOMY AND ADENOIDECTOMY     VASECTOMY      Family History  Problem Relation Age of Onset   COPD Mother        smoker   Prostate cancer Father 59       no longer with prostate cancer   Breast cancer Sister    COPD Paternal Aunt        smoker   Stroke Maternal Grandfather         late 3s   Heart disease Neg Hx    Colon cancer Neg Hx    Esophageal cancer Neg Hx    Pancreatic cancer Neg Hx    Rectal cancer Neg Hx    Stomach cancer Neg Hx    Diabetes Neg Hx    Other Neg Hx        low testosterone   Parkinson's disease Neg Hx     Allergies  Allergen Reactions   Penicillins     ? Reaction @ age 87    Current Outpatient Medications on File Prior to Visit  Medication Sig Dispense Refill   amantadine (SYMMETREL) 100 MG capsule Take 100 mg by mouth 2 (two) times daily.     carbidopa-levodopa (SINEMET CR) 50-200 MG tablet Take 1 tablet by mouth at bedtime. 30 tablet 11   carbidopa-levodopa (SINEMET IR) 25-100 MG tablet Take 1 tablet by mouth 4 (four) times daily. 120 tablet 11   chlorhexidine (PERIDEX) 0.12 % solution Use as directed in the mouth or throat daily at 6 (six) AM.     Cholecalciferol (VITAMIN D3 MAXIMUM STRENGTH) 125 MCG (5000 UT) capsule Take 5,000 Units by mouth daily.     coconut oil OIL Apply 1 Application topically as needed.      Docosahexaenoic Acid (DHA PO) Take 1 tablet by mouth daily at 6 (six) AM. Unsure dose     escitalopram (LEXAPRO) 10 MG tablet Take 1.5 tablets (15 mg total) by mouth daily. 45 tablet 5   gabapentin (NEURONTIN) 300 MG capsule Take 1 capsule (300 mg total) by mouth 2 (two) times daily. 60 capsule 5   Iron,  Ferrous Sulfate, 325 (65 Fe) MG TABS Take 325 mg by mouth daily. (Patient not taking: Reported on 04/04/2023) 30 tablet 1   LORazepam (ATIVAN) 0.5 MG tablet TAKE ONE TABLET BY MOUTH TWICE A DAY AS NEEDED FOR ANXIETY 60 tablet 0   rivaroxaban (XARELTO) 20 MG TABS tablet Take 1 tablet (20 mg total) by mouth daily with supper. 30 tablet 5   rOPINIRole (REQUIP) 4 MG tablet Take 1 tablet (4 mg total) by mouth at bedtime. (Patient not taking: Reported on 04/04/2023) 30 tablet 11   SODIUM FLUORIDE 5000 SENSITIVE 1.1-5 % GEL daily at 6 (six) AM.     Turmeric (QC TUMERIC COMPLEX PO) Take 1 tablet by mouth daily at 6 (six) AM. Unsure dose     No current facility-administered medications on file prior to visit.    BP 118/76   Pulse 62   Temp 97.9 F (36.6 C)   Resp 18   Ht 6' (1.829 m)   Wt 167 lb (75.8 kg)   SpO2 100%   BMI 22.65 kg/m         Objective:   Physical Exam  General Mental Status- Alert. General Appearance- Not in acute distress.   Skin General: Color- Normal Color. Moisture- Normal Moisture.  Neck  No JVD.  Chest and Lung Exam Auscultation: Breath Sounds:-Normal.  Cardiovascular Auscultation:Rythm- Regular. Murmurs & Other Heart Sounds:Auscultation of the heart reveals- No Murmurs.  Abdomen Inspection:-Inspeection Normal. Palpation/Percussion:Note:No mass. Palpation and Percussion of the abdomen reveal- Non Tender, Non Distended + BS, no rebound or guarding.   Neurologic Cranial Nerve exam:- CN III-XII intact(No nystagmus), symmetric smile. Strength:- 5/5 equal and symmetric strength both upper and lower extremities.   Lower ext- -no edema. Calfs  symmetric.    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 Preoperative evaluation and anticoagulation management required due to DVT history. - Order preoperative labs: CBC, metabolic panel. - Coordinate with surgeon and hematology for Xarelto management.  Deep Vein Thrombosis (DVT) on Xarelto On Xarelto. Management around surgery crucial to prevent thromboembolic complications.  Bradycardia Likely due to regular exercise. Asymptomatic with heart rate 59 bpm.  Hr increase to 90 low 100 on exercise. Cardiologist evaluation considered for surgical clearance. - Consult Dr. Dewayne Shorter regarding what appears to be.  Sinus bradycardia.1st degree block with no skipped beats. - Well see if need cardiac clearance thru cardiology or if I will clear.  Fatigue Labs planned to evaluate causes including nutritional deficiencies or thyroid dysfunction. - Order labs: B1, B12, iron panel, thyroid panel (TSH, T4), metabolic panel, CBC.  Follow up date to be determined after lab review and ekg review with cardio. Holding form presently.   Esperanza Richters, PA-C

## 2023-05-17 NOTE — Patient Instructions (Addendum)
 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 Preoperative evaluation and anticoagulation management required due to DVT history. - Order preoperative labs: CBC, metabolic panel. - Coordinate with surgeon and hematology for Xarelto management.  Deep Vein Thrombosis (DVT) on Xarelto On Xarelto. Management around surgery crucial to prevent thromboembolic complications.  Bradycardia Likely due to regular exercise. Asymptomatic with heart rate 59 bpm.  Hr increase to 90 low 100 on exercise. Cardiologist evaluation considered for surgical clearance. - Consult Dr. Dewayne Shorter regarding what appears to be.  Sinus bradycardia.1st degree block with no skipped beats. - Well see if need cardiac clearance thru cardiology or if I will clear.  Fatigue Labs planned to evaluate causes including nutritional deficiencies or thyroid dysfunction. - Order labs: B1, B12, iron panel, thyroid panel (TSH, T4), metabolic panel, CBC.  Follow up date to be determined after lab review and ekg review with cardio. Holding form presently.

## 2023-05-18 LAB — IRON,TIBC AND FERRITIN PANEL
%SAT: 23 % (ref 20–48)
Ferritin: 28 ng/mL (ref 24–380)
Iron: 81 ug/dL (ref 50–180)
TIBC: 353 ug/dL (ref 250–425)

## 2023-05-21 ENCOUNTER — Encounter: Payer: Self-pay | Admitting: Medical

## 2023-05-21 ENCOUNTER — Other Ambulatory Visit (HOSPITAL_BASED_OUTPATIENT_CLINIC_OR_DEPARTMENT_OTHER): Payer: Self-pay

## 2023-05-21 ENCOUNTER — Encounter: Payer: Self-pay | Admitting: Adult Health

## 2023-05-21 ENCOUNTER — Ambulatory Visit: Payer: 59 | Admitting: Adult Health

## 2023-05-21 VITALS — BP 118/70 | HR 78 | Ht 72.0 in | Wt 169.0 lb

## 2023-05-21 DIAGNOSIS — G2581 Restless legs syndrome: Secondary | ICD-10-CM

## 2023-05-21 DIAGNOSIS — G20A2 Parkinson's disease without dyskinesia, with fluctuations: Secondary | ICD-10-CM

## 2023-05-21 MED ORDER — ROPINIROLE HCL 4 MG PO TABS: 4.0000 mg | ORAL_TABLET | Freq: Every day | ORAL | 11 refills | Status: AC

## 2023-05-21 NOTE — Patient Instructions (Addendum)
 Your Plan:  Adjust Sinemet dosage, take IR 7am, 10am, 1pm and 4 pm. Start CR around 7pm  Decrease amantadine dosage to 100mg  every morning (stop afternoon dose), if symptoms worse please let me know  Continue gabapentin 600mg  nightly  Continue Requip 4mg  nightly   Referral placed to [facility] movement disorder clinic for second opinion and further medication management recommendations      Follow up in 4-6 months with Dr. Frances Furbish or call earlier if needed      Thank you for coming to see [place] at [facility]. I hope we have been able to provide you high quality care today.  You may receive a patient satisfaction survey over the next few weeks. We would appreciate your feedback and comments so that we may continue to improve ourselves and the health of our patients.

## 2023-05-21 NOTE — Progress Notes (Signed)
 [facility] [address] [place]. [place] [address] [phone]       OFFICE FOLLOW UP NOTE  Mr. Mark [name] Date of Birth:  December 18, 1960 Medical Record Number:  [id number]    Primary neurologist: Dr. Frances Furbish Reason for visit: Parkinson's disease    SUBJECTIVE:  CHIEF COMPLAINT:  Chief Complaint  Patient presents with   Injections    Pt in 3, here with wife [name]  Pt is following up on Parkinson's disease. Pt states he has been having trouble sleeping, states his speech is slurred. States he is shuffling more when he walks. Wonders if not sleeping well is what is causing his symptoms to be more pronounce. States his grip is not as strong as it used to be. States his face has a flat expression. Sialorrhea is more pronounced.       HPI:   Update 05/21/2023 JM: Patient returns for follow-up visit accompanied by his wife.  He reports gradual decline in gait with increased shuffling, generalized stiffness, decreased activity tolerance, dysarthria, sialorrhea and insomnia.  Currently on Sinemet IR 4 times daily, currently takes at 8 AM, 12 PM, 4 PM and 8 PM.  He does notice some worsening of symptoms just prior to next dose.  Also on Sinemet CR 1 tab usually around 9:30 PM to 10 PM, about 30 minutes prior to bedtime.  He does feel internal tremors previously interfering with sleep have slightly improved since initiating CR but continues to to have difficulty falling asleep.  He continues to work and tries to be active with routine exercise but can have worsening symptoms after increased exertion.  He also remains on ropinirole 4 mg nightly, gabapentin 600 mg nightly and amantadine 100 mg at 8 AM and 2 PM.  Ambulates without AD, no recent falls.  Denies any swallowing difficulties.      History provided for reference purposes only Update 10/26/2022 JM: Patient returns for follow-up visit regarding left sided predominant Parkinson's disease.  He is accompanied by his  wife. At prior visit, Dr. Frances Furbish increased ropinirole to 3 mg nightly for RLS and restarted gabapentin 300 mg nightly for intermittent left-sided paresthesias.  Continued on Sinemet 1 pill 4 times daily. During the interval time, ropinirole dosage increased to 4 mg and increased gabapentin to 600 mg nightly  Reports some worsening of L>R hand dexterity and fine motor skills, can have difficulty performing certain tasks/activities, difficulty writing. Gait has been stable, no recent falls. Continues to work full time and maintains routine exercise.  Continues on Sinemet 1 tab 4 times daily, usually around 8 AM, 12 PM, 4 PM and 8 PM although sometimes will forget evening dose. Is concerned regarding sleeping difficulty at times, feels internal tremor on left side, denies increased anxiety at that time or mind wandering, feels sleepy but unable to fall asleep. He does use Ativan as needed for anxiety, usually just once per day. Continues on Requip 4 mg nightly, working "okay" but still has some issues.  On gabapentin 600 mg nightly with some benefit of left-sided paresthesias.  History provided from Dr. Teofilo Pod prior OV note on 04/20/2022 for reference purposes only Mr. Lamay is a 63 year old right-handed gentleman with an underlying medical history of neck pain, reflux disease, chronic tinnitus, anxiety, and allergic rhinitis, who presents for follow-up consultation of his left-sided predominant Parkinson's disease with akinetic-rigid presentation.  The patient is accompanied by his wife today. I last saw him on 10/20/2021, at which time he reported feeling quite well.  He felt that the ropinirole was helping his restless legs.     Today, 04/20/2022: He reports feeling well.  His wife has noticed worsening of his speech difficulty first thing in the morning, he tends to mumble.  He has not fallen recently.  He exercises regularly and tries to stay active.  Constipation is under reasonable control with as needed use  of MiraLAX.  He has increased his ropinirole a couple weeks ago to 3 pills at night which helps his leg twitching and restless leg symptoms.  He increased the ropinirole on his own.  Anxiety is under good control, he takes Lexapro daily and Ativan as needed.  He saw orthopedics for his trigger finger.  He tried a splint but no other intervention was done.   The patient's allergies, current medications, family history, past medical history, past social history, past surgical history and problem list were reviewed and updated as appropriate.    Previously     I saw him on 04/14/2021, at which time he was advised to continue with generic Sinemet 1 pill 3 times daily, and amantadine 1 pill twice daily, ropinirole 2 mg at bedtime and Lexapro 10 mg in the morning.  He was advised to start a trial of gabapentin for paresthesias, 300 mg at bedtime.  He emailed in the interim in April 2023 reporting that he was too drowsy from the gabapentin at 300 mg strength and we switched it back to 100 mg strength.  We increased it in the interim in August to 200 mg daily.  He requested a referral in the interim to physical therapy outpatient.     I saw him on 08/18/2020, at which time he felt fairly stable but his wife felt that he had become slower.  He had limited his driving to mostly just going to work.  He is taking ropinirole 3 times a day and exercising regularly.  He was on low-dose Lexapro 5 mg strength and on a slightly reduced dose of Ativan per PCP.  He was advised to start Sinemet at the time.  We talked about a referral to Dr. Rubin Payor for DBS evaluation.   He had an appointment with Dr. Rubin Payor at [facility] and I reviewed the office note from 12/15/2020.  He was felt to be a good candidate for DBS but since he was doing fairly well on medication regimen, further evaluation was held for surgical treatment of his Parkinson's disease.  He was advised to start amantadine 100 mg twice daily.     We had several  email interactions in the interim.  In October 2022 we increased his Lexapro from 5 mg daily to 10 mg daily.  He also reported in the interim that he had more sleepiness from the ropinirole and had reduced it from 1 mg 3 times daily to 2 mg at bedtime only.     In February 2023, he reported having difficulty sleeping.  He was advised to take amantadine in the morning and midday and try melatonin at night.    I saw him on 02/19/2020, at which time he felt fairly well.  He had completed therapy through neuro rehab and had benefited from therapy.  He did not tolerate the once daily long-acting ropinirole.  He was on Lexapro 5 mg once daily with good tolerance.  He was on ropinirole 1 mg strength 1-1/2 pills 3 times a day.  He felt it was helpful.  He was complaining of fatigue and exhaustion.  He  is trying to stay active physically.  He was advised to gradually increase the ropinirole to eventually take 2 pills 3 times daily.       He missed an appointment on 02/10/20. I saw him on 11/12/2019, at which time he reported feeling better with regards to his Covid symptoms.  We mutually agreed to taper his Neupro patch, as he did not have much in the way of benefit from it.  For anxiety, he was advised to start Lexapro generic low-dose 5 mg strength once daily.  He was advised to start ropinirole immediate release.  He emailed back with the side effects on the ropinirole particularly worsening dizziness and sleepiness.  He was advised to change from immediate release ropinirole to long-acting ropinirole once daily 6 mg strength.     I saw him on 05/22/2019, at which time he reported that his dizziness had improved after he reduced the Neupro back to 6 mg daily from 8 mg daily.  He has had intermittent issues with anxiety.  Back stiffness was fairly stable, Flexeril as needed was helpful.  His wife was worried about his extensive exercise and that he would tire himself out.  We talked about moderation when he came to  work schedule and exercising.  He reported some tingling on the left side and feeling a cold sensation in the left arm and hand intermittently.  He did not have any significant constipation or sleep issues.  He and his wife were worried about his EEG.  He had tried Viagra but had side effects.  He had a prescription for Cialis but had not tried it.  I encouraged him to consider seeing a urologist and talk to his primary care provider about a referral, if the Cialis was not helpful. He was advised to continue with the Neupro patch 6 mg once daily.       I saw him on 01/21/2019, at which time he reported feeling better with regards to his restless leg symptoms.  He was taking melatonin at night, 5 mg strength.  He was noticing more stiffness and slowness.  I suggested we increase his Neupro to 8 mg daily.  He also reported back stiffness and back pain in the mid back area, was taking ibuprofen as needed and I suggested a trial of Flexeril.  He emailed in the interim in January 2021, reporting some bouts of constipation.  He and his wife also emailed through [employer] last month reporting that he felt dizzy and lightheaded.  Eventually, we decided to reduce his Neupro patch back to 6 mg daily.     I saw him on 10/01/2018, at which time he reported feeling better after starting his medication.  He also was not using his Ativan as much.  He was working full-time, very active overall, walking a lot.  He had noticed some intermittent sensory symptoms, nothing sustained.  He was advised to continue with the Neupro patch 6 mg strength.   He emailed in the interim with increase in restless leg symptoms, he was advised to try gabapentin in September 2020.     I saw him on 07/02/2018 in virtual visit, at which time we mutually agreed to start him on Neupro patch.  We increased it in the recent past to 6 mg daily.  He emailed recently regarding some swelling he had noticed in the left upper and lower extremities.  He had  an ultrasound of the left upper extremity which was negative for a blood clot.  I first met him on 04/03/2018 at the request of Dr. Pearletha Forge, at which time the patient reported a several month history, probably a 51-month history of left hand tremor, neck stiffness, difficulty with fine motor control with the left upper extremity, and hand stiffness.  His history and physical examination were in keeping with parkinsonism on the left side.  We talked about symptomatic treatment options to be utilized soon, I suggested we proceed with a nuclear medicine DaTscan.  Due to the COVID-19 pandemic, his scan has not yet been done, he was approved for it though.   The patient sent a MyChart message in the interim in late March 2020 indicating that he has noticed more symptoms including some difficulty with his speech, including slurring, increased salivation, causing drooling at times and some symptoms in his right hand.     04/03/2018: (He) reports a left hand tremor for the past several months, likely 6-9 months. In addition, he has noted difficulty with fine motor control, he has noticed neck stiffness and left hand stiffness and difficulty performing fine motor skills with the left hand. I reviewed your office note from 01/17/2018. He has had radiating neck pain to the left arm. He has had physical therapy for this. His neck pain has improved. He fell in 2017 and had neck pain. He had a X ray neck on 03/26/17: IMPRESSION: No acute findings.   Mild spondylosis of the cervical spine with moderate to severe multilevel bilateral neural foraminal narrowing as described.   He denies a family history of Parkinson's disease or tremor. He denies any recent falls. He denies any constipation. Mood is fairly stable but he does admit to having a longer standing history of anxiety. He takes Ativan as needed for this. He also reports claustrophobia and having had difficulty going through an MRI in the past.   He has  had some issues with becoming slower in his mobility. His brother recently commented on this. He is the youngest of 4 siblings, he has an older brother, then his sister, then another brother. He lives with his family which includes his wife and 24 year old daughter and 13 year old son. He works as a Insurance claims handler. He is a nonsmoker and drinks alcohol in the form of wine, maybe 3 glasses per week on average, caffeine in the form of coffee, 3 cups per day on average. He tries to hydrate well. He exercises regularly.      ROS:   14 system review of systems performed and negative with exception of those listed in HPI  PMH:  Past Medical History:  Diagnosis Date   Family history of prostate cancer     DRE WNL; PSA never > 0.38   Fasting hyperglycemia     PMH of   GERD (gastroesophageal reflux disease)    Parkinson's disease (HCC)    Tinnitus    w/o hearing loss    PSH:  Past Surgical History:  Procedure Laterality Date   COLONOSCOPY  05/2013   Neg, Dr Marina Goodell   KNEE SURGERY Right    TONSILLECTOMY AND ADENOIDECTOMY     VASECTOMY      Social History:  Social History   Socioeconomic History   Marital status: Married    Spouse name: Not on file   Number of children: Not on file   Years of education: Not on file   Highest education level: Associate degree: academic program  Occupational History   Not on file  Tobacco Use   Smoking  status: Never   Smokeless tobacco: Never  Substance and Sexual Activity   Alcohol use: Yes    Comment: very rare wine , < one per month per pt.   Drug use: No   Sexual activity: Yes  Other Topics Concern   Not on file  Social History Narrative   Not on file   Social Drivers of Health   Financial Resource Strain: Low Risk  (05/16/2022)   Overall Financial Resource Strain (CARDIA)    Difficulty of Paying Living Expenses: Not hard at all  Food Insecurity: Low Risk  (04/20/2023)   Received from [facility]   Hunger Vital Sign    Worried About  Running Out of Food in the Last Year: Never true    Ran Out of Food in the Last Year: Never true  Transportation Needs: No Transportation Needs (04/20/2023)   Received from [facility]    In the past 12 months, has lack of reliable transportation kept you from medical appointments, meetings, work or from getting things needed for daily living? : No  Physical Activity: Sufficiently Active (05/16/2022)   Exercise Vital Sign    Days of Exercise per Week: 5 days    Minutes of Exercise per Session: 40 min  Stress: No Stress Concern Present (05/16/2022)   [employer] of Occupational Health - Occupational Stress Questionnaire    Feeling of Stress : Not at all  Social Connections: Moderately Integrated (05/16/2022)   Social Connection and Isolation Panel [NHANES]    Frequency of Communication with Friends and Family: Once a week    Frequency of Social Gatherings with Friends and Family: Once a week    Attends Religious Services: More than 4 times per year    Active Member of [employer] or Organizations: Yes    Attends Engineer, structural: More than 4 times per year    Marital Status: Married  Catering manager Violence: Not At Risk (04/04/2023)   Humiliation, Afraid, Rape, and Kick questionnaire    Fear of Current or Ex-Partner: No    Emotionally Abused: No    Physically Abused: No    Sexually Abused: No    Family History:  Family History  Problem Relation Age of Onset   COPD Mother        smoker   Prostate cancer Father 51       no longer with prostate cancer   Breast cancer Sister    COPD Paternal Aunt        smoker   Stroke Maternal Grandfather         late 76s   Heart disease Neg Hx    Colon cancer Neg Hx    Esophageal cancer Neg Hx    Pancreatic cancer Neg Hx    Rectal cancer Neg Hx    Stomach cancer Neg Hx    Diabetes Neg Hx    Other Neg Hx        low testosterone   Parkinson's disease Neg Hx     Medications:   Current Outpatient Medications on  File Prior to Visit  Medication Sig Dispense Refill   amantadine (SYMMETREL) 100 MG capsule Take 100 mg by mouth 2 (two) times daily.     carbidopa-levodopa (SINEMET CR) 50-200 MG tablet Take 1 tablet by mouth at bedtime. 30 tablet 11   carbidopa-levodopa (SINEMET IR) 25-100 MG tablet Take 1 tablet by mouth 4 (four) times daily. 120 tablet 11   chlorhexidine (PERIDEX) 0.12 % solution  Use as directed in the mouth or throat daily at 6 (six) AM.     Cholecalciferol (VITAMIN D3 MAXIMUM STRENGTH) 125 MCG (5000 UT) capsule Take 5,000 Units by mouth daily.     coconut oil OIL Apply 1 Application topically as needed.     Docosahexaenoic Acid (DHA PO) Take 1 tablet by mouth daily at 6 (six) AM. Unsure dose     escitalopram (LEXAPRO) 10 MG tablet Take 1.5 tablets (15 mg total) by mouth daily. 45 tablet 5   gabapentin (NEURONTIN) 300 MG capsule Take 1 capsule (300 mg total) by mouth 2 (two) times daily. 60 capsule 5   Iron, Ferrous Sulfate, 325 (65 Fe) MG TABS Take 325 mg by mouth daily. 30 tablet 1   LORazepam (ATIVAN) 0.5 MG tablet TAKE ONE TABLET BY MOUTH TWICE A DAY AS NEEDED FOR ANXIETY 60 tablet 0   rivaroxaban (XARELTO) 20 MG TABS tablet Take 1 tablet (20 mg total) by mouth daily with supper. 30 tablet 5   SODIUM FLUORIDE 5000 SENSITIVE 1.1-5 % GEL daily at 6 (six) AM.     Turmeric (QC TUMERIC COMPLEX PO) Take 1 tablet by mouth daily at 6 (six) AM. Unsure dose     rOPINIRole (REQUIP) 4 MG tablet Take 1 tablet (4 mg total) by mouth at bedtime. (Patient not taking: Reported on 05/21/2023) 30 tablet 11   No current facility-administered medications on file prior to visit.    Allergies:   Allergies  Allergen Reactions   Penicillins     ? Reaction @ age 27      OBJECTIVE:  Physical Exam  Vitals:   05/21/23 1457  BP: 118/70  Pulse: 78  Weight: 169 lb (76.7 kg)  Height: 6' (1.829 m)   Body mass index is 22.92 kg/m. No results found.   General: well developed, well nourished, very  pleasant middle-aged Caucasian male, seated, in no evident distress  Neurologic Exam Mental Status: Awake and fully alert.  No evidence of dysarthria or aphasia today.  Mild hypophonia.  Oriented to place and time. Recent and remote memory intact. Attention span, concentration and fund of knowledge appropriate. Mood and affect appropriate.  Cranial Nerves: Pupils equal, briskly reactive to light. Extraocular movements full without nystagmus. Visual fields full to confrontation. Hearing intact. Facial sensation intact. Face, tongue, palate moves normally and symmetrically.  Moderate facial masking and decreased eye blink rate. Motor: Normal strength in all tested extremity muscles except slightly decreased left grip weakness.  Increased tone in upper extremities, L>R.  No obvious resting tremor, no significant postural or action tremor.  Fine motor skills moderate impaired on left, mild on right. Sensory.: intact to touch , pinprick , position and vibratory sensation.  Coordination: Finger-to-nose and heel-to-shin performed accurately bilaterally. Gait and Station: Arises from chair without difficulty.  Able to stand with arms crossed.  Stance is normal. Gait demonstrates normal stride length with some decreased arm swing L>R. Able to tandem walk and heel toe without difficulty.  Reflexes: 1+ and symmetric. Toes downgoing.         ASSESSMENT/PLAN: Johnatha Zeidman is a 63 y.o. year old male with left sided predominant Parkinson's disease initially diagnosed in 2020, DaTscan 2020 near absent activity in the [place] and asymmetric decreased activity in the head of the right caudate nucleus.  Reports gradual decline in overall functioning including increased shuffling, generalized stiffness, hypophonia, sialorrhea and insomnia.     Parkinson's disease:  Recommend making adjustment to time of Sinemet IR and  CR, recommend continued Sinemet IR 4 times daily but take every 3 hours starting at 7 AM and  then take Sinemet CR around 7 to 8 PM allowing more time to take effect prior to bedtime.  Consider dosage adjustment if needed in the future Recommend decreasing amantadine to 100 mg AM as afternoon dose possibly contributing to insomnia Continue ropinirole 4 mg nightly for RLS Continue gabapentin 600 mg nightly for left-sided paresthesias Referral placed to [facility] movement disorder clinic for further medication recommendations     Follow up in 4-6 months with Dr. Frances Furbish for further medication recommendations or call earlier if needed     CC:  PCP: Saguier, Ramon Dredge, PA-C    I spent 40 minutes of face-to-face and non-face-to-face time with patient and wife.  This included previsit chart review, lab review, study review, order entry, electronic health record documentation, patient education and discussion regarding above diagnoses and treatment plan and answered all other questions to patient and wife's satisfaction  Ihor Austin, AGNP-BC  [facility] [address] [place], [place] [address]  Phone [phone] Fax [phone] Note: This document was prepared with digital dictation and possible smart phrase technology. Any transcriptional errors that result from this process are unintentional.

## 2023-05-22 ENCOUNTER — Telehealth: Payer: Self-pay | Admitting: Adult Health

## 2023-05-22 LAB — VITAMIN B1: Vitamin B1 (Thiamine): 17 nmol/L (ref 8–30)

## 2023-05-22 NOTE — Telephone Encounter (Signed)
Referral for neurology fax to [facility]. Phone: [phone], Fax: [phone].

## 2023-05-23 ENCOUNTER — Telehealth: Payer: Self-pay | Admitting: Medical

## 2023-05-23 ENCOUNTER — Encounter: Payer: Self-pay | Admitting: Medical

## 2023-05-23 MED ORDER — LORAZEPAM 0.5 MG PO TABS: ORAL_TABLET | ORAL | 2 refills | Status: DC

## 2023-05-23 NOTE — Telephone Encounter (Signed)
 Maralyn Sago,  Had a question for you. Pt is scheduled to have swan neck deformity surgery to left ring finger end of this month. I have a preop form asking for instruction on xarelto. With this surgery what time frame would you recommend holding xarelto.(2 days before surgery or more?) Wanted your advise before filling out medical clearance form.  Thanks  [employer], PA-C

## 2023-05-23 NOTE — Telephone Encounter (Signed)
 Requesting: lorazepam 0.5mg   Contract: 12/18/22 UDS: 11/24/22 Last Visit: 05/17/23 Next Visit: 05/25/23 Last Refill: 03/30/23 #60 and 0RF   Please Advise

## 2023-05-23 NOTE — Addendum Note (Signed)
 Addended by: Gwenevere Abbot on: 05/23/2023 03:24 PM   Modules accepted: Orders

## 2023-05-25 ENCOUNTER — Ambulatory Visit: Payer: 59 | Admitting: Medical

## 2023-06-07 ENCOUNTER — Encounter: Payer: Self-pay | Admitting: Adult Health

## 2023-06-18 ENCOUNTER — Telehealth (HOSPITAL_BASED_OUTPATIENT_CLINIC_OR_DEPARTMENT_OTHER): Payer: Self-pay

## 2023-06-19 ENCOUNTER — Telehealth (HOSPITAL_BASED_OUTPATIENT_CLINIC_OR_DEPARTMENT_OTHER): Payer: Self-pay

## 2023-06-26 ENCOUNTER — Telehealth (HOSPITAL_BASED_OUTPATIENT_CLINIC_OR_DEPARTMENT_OTHER): Payer: Self-pay

## 2023-07-02 ENCOUNTER — Encounter: Payer: Self-pay | Admitting: Family

## 2023-07-02 ENCOUNTER — Other Ambulatory Visit: Payer: Self-pay | Admitting: Family

## 2023-07-02 ENCOUNTER — Inpatient Hospital Stay: Payer: 59 | Admitting: Family

## 2023-07-02 ENCOUNTER — Ambulatory Visit (HOSPITAL_BASED_OUTPATIENT_CLINIC_OR_DEPARTMENT_OTHER)
Admission: RE | Admit: 2023-07-02 | Discharge: 2023-07-02 | Disposition: A | Discharge status: Home / Self Care | Source: Ambulatory Visit | Attending: Family | Admitting: Family

## 2023-07-02 ENCOUNTER — Inpatient Hospital Stay: Payer: 59 | Attending: Hematology & Oncology

## 2023-07-02 VITALS — BP 121/75 | HR 78 | Temp 98.0°F | Resp 17 | Wt 169.8 lb

## 2023-07-02 DIAGNOSIS — I82462 Acute embolism and thrombosis of left calf muscular vein: Secondary | ICD-10-CM | POA: Insufficient documentation

## 2023-07-02 DIAGNOSIS — Z7901 Long term (current) use of anticoagulants: Secondary | ICD-10-CM | POA: Insufficient documentation

## 2023-07-02 DIAGNOSIS — D6851 Activated protein C resistance: Secondary | ICD-10-CM | POA: Diagnosis not present

## 2023-07-02 LAB — CBC WITH DIFFERENTIAL (CANCER CENTER ONLY)
Abs Immature Granulocytes: 0.01 10*3/uL (ref 0.00–0.07)
Basophils Absolute: 0 10*3/uL (ref 0.0–0.1)
Basophils Relative: 1 %
Eosinophils Absolute: 0.1 10*3/uL (ref 0.0–0.5)
Eosinophils Relative: 2 %
HCT: 37.6 % — ABNORMAL LOW (ref 39.0–52.0)
Hemoglobin: 12.2 g/dL — ABNORMAL LOW (ref 13.0–17.0)
Immature Granulocytes: 0 %
Lymphocytes Relative: 27 %
Lymphs Abs: 1.4 10*3/uL (ref 0.7–4.0)
MCH: 29.8 pg (ref 26.0–34.0)
MCHC: 32.4 g/dL (ref 30.0–36.0)
MCV: 91.7 fL (ref 80.0–100.0)
Monocytes Absolute: 0.5 10*3/uL (ref 0.1–1.0)
Monocytes Relative: 10 %
Neutro Abs: 3.1 10*3/uL (ref 1.7–7.7)
Neutrophils Relative %: 60 %
Platelet Count: 128 10*3/uL — ABNORMAL LOW (ref 150–400)
RBC: 4.1 MIL/uL — ABNORMAL LOW (ref 4.22–5.81)
RDW: 14.5 % (ref 11.5–15.5)
WBC Count: 5.2 10*3/uL (ref 4.0–10.5)
nRBC: 0 % (ref 0.0–0.2)

## 2023-07-02 LAB — CMP (CANCER CENTER ONLY)
ALT: 7 U/L (ref 0–44)
AST: 20 U/L (ref 15–41)
Albumin: 4.4 g/dL (ref 3.5–5.0)
Alkaline Phosphatase: 45 U/L (ref 38–126)
Anion gap: 7 (ref 5–15)
BUN: 11 mg/dL (ref 6–20)
CO2: 28 mmol/L (ref 22–32)
Calcium: 9.1 mg/dL (ref 8.9–10.3)
Chloride: 103 mmol/L (ref 98–111)
Creatinine: 0.92 mg/dL (ref 0.61–1.24)
GFR, Estimated: >60 mL/min (ref >60)
Glucose, Bld: 132 mg/dL — ABNORMAL HIGH (ref 70–99)
Potassium: 3.8 mmol/L (ref 3.5–5.1)
Sodium: 138 mmol/L (ref 135–145)
Total Bilirubin: 0.5 mg/dL (ref 0.0–1.2)
Total Protein: 6.6 g/dL (ref 6.5–8.1)

## 2023-07-02 NOTE — Progress Notes (Signed)
 Hematology and Oncology Follow Up Visit  Konner Saiz [id number] 15-Feb-1960 63 y.o. 07/02/2023   Principle Diagnosis:  LLE DVT Factor V Leiden, heterozygous   Current Therapy:   Xarelto  20 mg PO Daily   Interim History:  Mr. Xu is here today for follow-up. He is doing quite well and has no complaints at this time.  He is tolerating the Xarelto  nicely. No issue with blood loss. No bruising or petechiae.  No fever, chills, n/v, cough, rash, dizziness, SOB, chest pain, palpitations, abdominal pain or changes in bowel or bladder habits.  No swelling, tenderness, numbness or tingling in his extremities.  Pedal pulses are 2+.  No falls or syncope reported.  Appetite and hydration are good. Weight is stable at 169 lbs.   ECOG Performance Status: 1 - Symptomatic but completely ambulatory  Medications:  Allergies as of 07/02/2023       Reactions   Penicillins    ? Reaction @ age 25        Medication List        Accurate as of Jul 02, 2023  3:05 PM. If you have any questions, ask your nurse or doctor.          amantadine 100 MG capsule Commonly known as: SYMMETREL Take 100 mg by mouth daily.   carbidopa -levodopa  25-100 MG tablet Commonly known as: SINEMET  IR Take 1 tablet by mouth 4 (four) times daily.   carbidopa -levodopa  50-200 MG tablet Commonly known as: SINEMET  CR Take 1 tablet by mouth at bedtime.   chlorhexidine 0.12 % solution Commonly known as: PERIDEX Use as directed in the mouth or throat daily at 6 (six) AM.   coconut oil Oil Apply 1 Application topically as needed.   DHA PO Take 1 tablet by mouth daily at 6 (six) AM. Unsure dose   escitalopram  10 MG tablet Commonly known as: LEXAPRO  Take 1.5 tablets (15 mg total) by mouth daily.   gabapentin  300 MG capsule Commonly known as: NEURONTIN  Take 1 capsule (300 mg total) by mouth 2 (two) times daily.   Iron  (Ferrous Sulfate ) 325 (65 Fe) MG Tabs Take 325 mg by mouth daily.   LORazepam  0.5  MG tablet Commonly known as: ATIVAN  TAKE ONE TABLET BY MOUTH TWICE A DAY AS NEEDED FOR ANXIETY   QC TUMERIC COMPLEX PO Take 1 tablet by mouth daily at 6 (six) AM. Unsure dose   rOPINIRole  4 MG tablet Commonly known as: REQUIP  Take 1 tablet (4 mg total) by mouth at bedtime.   Sodium Fluoride 5000 Sensitive 1.1-5 % Gel Generic drug: Sod Fluoride-Potassium Nitrate daily at 6 (six) AM.   Vitamin D3 Maximum Strength 125 MCG (5000 UT) capsule Generic drug: Cholecalciferol Take 5,000 Units by mouth daily.   Xarelto  20 MG Tabs tablet Generic drug: rivaroxaban  Take 1 tablet (20 mg total) by mouth daily with supper.        Allergies:  Allergies  Allergen Reactions   Penicillins     ? Reaction @ age 87    Past Medical History, Surgical history, Social history, and Family History were reviewed and updated.  Review of Systems: All other 10 point review of systems is negative.   Physical Exam:  vitals were not taken for this visit.   Wt Readings from Last 3 Encounters:  05/21/23 169 lb (76.7 kg)  05/17/23 167 lb (75.8 kg)  04/04/23 170 lb (77.1 kg)    Ocular: Sclerae unicteric, pupils equal, round and reactive to light Ear-nose-throat: Oropharynx clear, dentition  fair Lymphatic: No cervical or supraclavicular adenopathy Lungs no rales or rhonchi, good excursion bilaterally Heart regular rate and rhythm, no murmur appreciated Abd soft, nontender, positive bowel sounds MSK no focal spinal tenderness, no joint edema Neuro: non-focal, well-oriented, appropriate affect Breasts: Deferred   Lab Results  Component Value Date   WBC 3.2 (L) 05/17/2023   HGB 13.4 05/17/2023   HCT 40.9 05/17/2023   MCV 88.5 05/17/2023   PLT 147.0 (L) 05/17/2023   Lab Results  Component Value Date   FERRITIN 28 05/17/2023   IRON  81 05/17/2023   TIBC 353 05/17/2023   IRONPCTSAT 23 05/17/2023   Lab Results  Component Value Date   RBC 4.61 05/17/2023   No results found for:  "KPAFRELGTCHN", "LAMBDASER", "KAPLAMBRATIO" No results found for: "IGGSERUM", "IGA", "IGMSERUM" No results found for: "TOTALPROTELP", "ALBUMINELP", "A1GS", "A2GS", "BETS", "BETA2SER", "GAMS", "MSPIKE", "SPEI"   Chemistry      Component Value Date/Time   NA 138 05/17/2023 0941   K 5.0 05/17/2023 0941   CL 101 05/17/2023 0941   CO2 32 05/17/2023 0941   BUN 16 05/17/2023 0941   CREATININE 1.00 05/17/2023 0941   CREATININE 0.94 04/04/2023 1039      Component Value Date/Time   CALCIUM 9.5 05/17/2023 0941   ALKPHOS 55 05/17/2023 0941   AST 20 05/17/2023 0941   AST 19 04/04/2023 1039   ALT 7 05/17/2023 0941   ALT 5 04/04/2023 1039   BILITOT 0.7 05/17/2023 0941   BILITOT 0.7 04/04/2023 1039       Impression and Plan:  Mr. Graffam is a pleasant 63 yo caucasian gentleman with recent diagnosis of LLE DVT. Hyper coag panel revealed him to be heterozygous for the Factor V Leiden mutation.  He will continue his same regimen with Xarelto  20 mg PO daily.  He has his repeat LLE US  later today.  Follow-up in 6 months.   Kennard Pea, NP 5/19/20253:05 PM

## 2023-07-04 ENCOUNTER — Encounter: Payer: Self-pay | Admitting: *Deleted

## 2023-07-11 ENCOUNTER — Encounter: Payer: Self-pay | Admitting: Adult Health

## 2023-08-03 ENCOUNTER — Other Ambulatory Visit: Payer: Self-pay | Admitting: Neurology

## 2023-08-09 ENCOUNTER — Encounter: Payer: Self-pay | Admitting: Adult Health

## 2023-08-20 ENCOUNTER — Other Ambulatory Visit (HOSPITAL_BASED_OUTPATIENT_CLINIC_OR_DEPARTMENT_OTHER): Payer: Self-pay

## 2023-08-28 ENCOUNTER — Encounter: Payer: Self-pay | Admitting: Medical

## 2023-09-06 ENCOUNTER — Encounter: Payer: Self-pay | Admitting: Neurology

## 2023-09-07 ENCOUNTER — Encounter: Payer: Self-pay | Admitting: Medical

## 2023-09-07 NOTE — Telephone Encounter (Signed)
 Spoke with Joana at [facility] and he was given 45

## 2023-09-10 ENCOUNTER — Other Ambulatory Visit: Payer: Self-pay

## 2023-09-10 MED ORDER — LORAZEPAM 0.5 MG PO TABS: ORAL_TABLET | ORAL | 0 refills | Status: DC

## 2023-09-10 NOTE — Telephone Encounter (Signed)
 I spoke with [facility] on firday they stated the medication was sent in as a 45 supply and shanae spoke with them today and stated it was sent in as a 45 supply .SABRA Unsure but they stated insurance will cover 60 count but not 15 count to compensate for the error

## 2023-09-10 NOTE — Telephone Encounter (Signed)
 Pharmacy stated that pt picked up 45 tablet on 08/23/23 which was a January script because it had refills on it   The new script for quantity 60 which was made in April had/has no refills when sent in so the January was filled , can be refilled in 3 days

## 2023-09-10 NOTE — Addendum Note (Signed)
 Addended by: DORINA DALLAS HERO on: 09/10/2023 03:23 PM   Modules accepted: Orders

## 2023-09-12 DIAGNOSIS — Z0289 Encounter for other administrative examinations: Secondary | ICD-10-CM

## 2023-09-14 ENCOUNTER — Other Ambulatory Visit: Payer: Self-pay | Admitting: Neurology

## 2023-09-18 ENCOUNTER — Encounter: Payer: Self-pay | Admitting: Neurology

## 2023-09-18 DIAGNOSIS — G20A2 Parkinson's disease without dyskinesia, with fluctuations: Secondary | ICD-10-CM

## 2023-09-20 ENCOUNTER — Telehealth: Payer: Self-pay | Admitting: Adult Health

## 2023-09-20 NOTE — Telephone Encounter (Signed)
 Referral for Physical Therapy faxed to [facility]  Physical Therapy ([place] )   [facility]  Physical Therapy ([place] ) Phone [phone] Fax [phone]

## 2023-09-24 NOTE — Telephone Encounter (Signed)
 Patient has a f/u visit with Dr. Buck next week. We did not discuss FMLA at prior visit so request he discuss this with Dr. Buck next week. Thank you.

## 2023-09-24 NOTE — Telephone Encounter (Signed)
 Form has been completed and placed in Dr Obie bin for them to review and her to sign if she decides to do so.

## 2023-10-01 ENCOUNTER — Ambulatory Visit: Admitting: Neurology

## 2023-10-01 ENCOUNTER — Encounter: Payer: Self-pay | Admitting: Neurology

## 2023-10-01 VITALS — BP 112/74 | HR 68 | Ht 72.0 in | Wt 166.0 lb

## 2023-10-01 DIAGNOSIS — F419 Anxiety disorder, unspecified: Secondary | ICD-10-CM

## 2023-10-01 DIAGNOSIS — R498 Other voice and resonance disorders: Secondary | ICD-10-CM

## 2023-10-01 DIAGNOSIS — K5909 Other constipation: Secondary | ICD-10-CM

## 2023-10-01 DIAGNOSIS — G2581 Restless legs syndrome: Secondary | ICD-10-CM | POA: Diagnosis not present

## 2023-10-01 DIAGNOSIS — G479 Sleep disorder, unspecified: Secondary | ICD-10-CM

## 2023-10-01 DIAGNOSIS — G20A2 Parkinson's disease without dyskinesia, with fluctuations: Secondary | ICD-10-CM | POA: Diagnosis not present

## 2023-10-01 NOTE — Patient Instructions (Signed)
 I have signed off on your FMLA paperwork. We placed a referral to PT as requested on 09/18/23. Please call the PT place an inquire as to the status of the referral and verify the receipt of it.  Keep your upcoming appointment with Dr. Rosalia.  Keep your medications the same for now.  Follow up to see Harlene in about 6 months after seeing Dr. Rosalia.

## 2023-10-01 NOTE — Progress Notes (Signed)
 Subjective:    Patient ID: Mark [name] is a 63 y.o. male.  HPI    Interim history:   Mark [name] is a 63 year old right-handed gentleman with an underlying medical history of neck pain, reflux disease, chronic tinnitus, anxiety, and allergic rhinitis, who presents for follow-up consultation of his left-sided predominant Parkinson's disease with akinetic-rigid presentation, complicated by restless leg syndrome, sleep disturbance, and anxiety.  The patient is accompanied by his wife today.  He was last seen in our clinic in April 2025 by Harlene Bogaert, NP, at which time he was advised to adjust the timing of his levodopa  therapy by taking the immediate release 4 times a day starting at 7 AM, 3 hourly and Sinemet  CR around 7 or 8 PM.  He was advised to decrease amantadine to 100 mg once daily in the morning and continue with ropinirole  4 mg at night for his RLS.  He was advised to continue with gabapentin  600 mg nightly.  A referral to an academic center was discussed and initiated.  He is scheduled to see Dr. Rosalia next month.    Today, 10/01/2023: He reports feeling more or less stable, tremor fluctuates.  At the end of the day he has more fatigue, and more stiffness and tends to shuffle more, tends to speak more softly and slur his words sometimes.  He has not had much in the way of drooling except occasional.  He has some constipation and MiraLAX did not help very much, he is now on Dulcolax every other day.  He phased out of taking ropinirole  and has failed no major repercussions after being off of it.  He has not heard anything about the physical therapy referral but it was initiated about 2 weeks ago.  He has requested FMLA paperwork which I filled out today.  He is scheduled to see Dr. Rosalia in September 2025.  He had previously seen him a few years ago for DBS evaluation at the time.   He is requesting FMLA paperwork to be filled out so he can have some time off for his PD symptoms when  they flareup.  He often has worsening symptoms when he has not slept well.  His insomnia overall is better but still flares up from time to time.  He is up-to-date with his eye examination.  He has not had any recent falls.  The patient's allergies, current medications, family history, past medical history, past social history, past surgical history and problem list were reviewed and updated as appropriate.    Previously    05/21/23 [name], NP): << Patient returns for follow-up visit accompanied by his wife.  He reports gradual decline in gait with increased shuffling, generalized stiffness, decreased activity tolerance, dysarthria, sialorrhea and insomnia.  Currently on Sinemet  IR 4 times daily, currently takes at 8 AM, 12 PM, 4 PM and 8 PM.  He does notice some worsening of symptoms just prior to next dose.  Also on Sinemet  CR 1 tab usually around 9:30 PM to 10 PM, about 30 minutes prior to bedtime.  He does feel internal tremors previously interfering with sleep have slightly improved since initiating CR but continues to to have difficulty falling asleep.  He continues to work and tries to be active with routine exercise but can have worsening symptoms after increased exertion.  He also remains on ropinirole  4 mg nightly, gabapentin  600 mg nightly and amantadine 100 mg at 8 AM and 2 PM.  Ambulates without AD, no recent falls.  Denies any swallowing difficulties. >>   10/26/2022 [name], NP): <<Patient returns for follow-up visit regarding left sided predominant Parkinson's disease.  He is accompanied by his wife. At prior visit, Dr. Buck increased ropinirole  to 3 mg nightly for RLS and restarted gabapentin  300 mg nightly for intermittent left-sided paresthesias.  Continued on Sinemet  1 pill 4 times daily. During the interval time, ropinirole  dosage increased to 4 mg and increased gabapentin  to 600 mg nightly   Reports some worsening of L>R hand dexterity and fine motor skills, can have  difficulty performing certain tasks/activities, difficulty writing. Gait has been stable, no recent falls. Continues to work full time and maintains routine exercise.  Continues on Sinemet  1 tab 4 times daily, usually around 8 AM, 12 PM, 4 PM and 8 PM although sometimes will forget evening dose. Is concerned regarding sleeping difficulty at times, feels internal tremor on left side, denies increased anxiety at that time or mind wandering, feels sleepy but unable to fall asleep. He does use Ativan  as needed for anxiety, usually just once per day. Continues on Requip  4 mg nightly, working okay but still has some issues.  On gabapentin  600 mg nightly with some benefit of left-sided paresthesias.>>    04/20/2022 (SA): He reports feeling well.  His wife has noticed worsening of his speech difficulty first thing in the morning, he tends to mumble.  He has not fallen recently.  He exercises regularly and tries to stay active.  Constipation is under reasonable control with as needed use of MiraLAX.  He has increased his ropinirole  a couple weeks ago to 3 pills at night which helps his leg twitching and restless leg symptoms.  He increased the ropinirole  on his own.  Anxiety is under good control, he takes Lexapro  daily and Ativan  as needed.  He saw orthopedics for his trigger finger.  He tried a splint but no other intervention was done.   I saw him on 10/20/2021, at which time he reported feeling quite well.  He felt that the ropinirole  was helping his restless legs.       I saw him on 04/14/2021, at which time he was advised to continue with generic Sinemet  1 pill 3 times daily, and amantadine 1 pill twice daily, ropinirole  2 mg at bedtime and Lexapro  10 mg in the morning.  He was advised to start a trial of gabapentin  for paresthesias, 300 mg at bedtime.  He emailed in the interim in April 2023 reporting that he was too drowsy from the gabapentin  at 300 mg strength and we switched it back to 100 mg strength.  We  increased it in the interim in August to 200 mg daily.  He requested a referral in the interim to physical therapy outpatient.     I saw him on 08/18/2020, at which time he felt fairly stable but his wife felt that he had become slower.  He had limited his driving to mostly just going to work.  He is taking ropinirole  3 times a day and exercising regularly.  He was on low-dose Lexapro  5 mg strength and on a slightly reduced dose of Ativan  per PCP.  He was advised to start Sinemet  at the time.  We talked about a referral to Dr. Rosalia for DBS evaluation.   He had an appointment with Dr. Rosalia at [facility] and I reviewed the office note from 12/15/2020.  He was felt to be a good candidate for DBS but since he was doing fairly  well on medication regimen, further evaluation was held for surgical treatment of his Parkinson's disease.  He was advised to start amantadine 100 mg twice daily.     We had several email interactions in the interim.  In October 2022 we increased his Lexapro  from 5 mg daily to 10 mg daily.  He also reported in the interim that he had more sleepiness from the ropinirole  and had reduced it from 1 mg 3 times daily to 2 mg at bedtime only.     In February 2023, he reported having difficulty sleeping.  He was advised to take amantadine in the morning and midday and try melatonin at night.    I saw him on 02/19/2020, at which time he felt fairly well.  He had completed therapy through neuro rehab and had benefited from therapy.  He did not tolerate the once daily long-acting ropinirole .  He was on Lexapro  5 mg once daily with good tolerance.  He was on ropinirole  1 mg strength 1-1/2 pills 3 times a day.  He felt it was helpful.  He was complaining of fatigue and exhaustion.  He is trying to stay active physically.  He was advised to gradually increase the ropinirole  to eventually take 2 pills 3 times daily.       He missed an appointment on 02/10/20. I saw him on 11/12/2019, at which  time he reported feeling better with regards to his Covid symptoms.  We mutually agreed to taper his Neupro  patch, as he did not have much in the way of benefit from it.  For anxiety, he was advised to start Lexapro  generic low-dose 5 mg strength once daily.  He was advised to start ropinirole  immediate release.  He emailed back with the side effects on the ropinirole  particularly worsening dizziness and sleepiness.  He was advised to change from immediate release ropinirole  to long-acting ropinirole  once daily 6 mg strength.     I saw him on 05/22/2019, at which time he reported that his dizziness had improved after he reduced the Neupro  back to 6 mg daily from 8 mg daily.  He has had intermittent issues with anxiety.  Back stiffness was fairly stable, Flexeril  as needed was helpful.  His wife was worried about his extensive exercise and that he would tire himself out.  We talked about moderation when he came to work schedule and exercising.  He reported some tingling on the left side and feeling a cold sensation in the left arm and hand intermittently.  He did not have any significant constipation or sleep issues.  He and his wife were worried about his EEG.  He had tried Viagra  but had side effects.  He had a prescription for Cialis  but had not tried it.  I encouraged him to consider seeing a urologist and talk to his primary care provider about a referral, if the Cialis  was not helpful. He was advised to continue with the Neupro  patch 6 mg once daily.       I saw him on 01/21/2019, at which time he reported feeling better with regards to his restless leg symptoms.  He was taking melatonin at night, 5 mg strength.  He was noticing more stiffness and slowness.  I suggested we increase his Neupro  to 8 mg daily.  He also reported back stiffness and back pain in the mid back area, was taking ibuprofen as needed and I suggested a trial of Flexeril .  He emailed in the interim in January 2021,  reporting some bouts  of constipation.  He and his wife also emailed through [employer] last month reporting that he felt dizzy and lightheaded.  Eventually, we decided to reduce his Neupro  patch back to 6 mg daily.     I saw him on 10/01/2018, at which time he reported feeling better after starting his medication.  He also was not using his Ativan  as much.  He was working full-time, very active overall, walking a lot.  He had noticed some intermittent sensory symptoms, nothing sustained.  He was advised to continue with the Neupro  patch 6 mg strength.   He emailed in the interim with increase in restless leg symptoms, he was advised to try gabapentin  in September 2020.     I saw him on 07/02/2018 in virtual visit, at which time we mutually agreed to start him on Neupro  patch.  We increased it in the recent past to 6 mg daily.  He emailed recently regarding some swelling he had noticed in the left upper and lower extremities.  He had an ultrasound of the left upper extremity which was negative for a blood clot.       I first met him on 04/03/2018 at the request of Dr. Cleatrice, at which time the patient reported a several month history, probably a 66-month history of left hand tremor, neck stiffness, difficulty with fine motor control with the left upper extremity, and hand stiffness.  His history and physical examination were in keeping with parkinsonism on the left side.  We talked about symptomatic treatment options to be utilized soon, I suggested we proceed with a nuclear medicine DaTscan .  Due to the COVID-19 pandemic, his scan has not yet been done, he was approved for it though.   The patient sent a MyChart message in the interim in late March 2020 indicating that he has noticed more symptoms including some difficulty with his speech, including slurring, increased salivation, causing drooling at times and some symptoms in his right hand.     04/03/2018: (He) reports a left hand tremor for the past several months, likely  6-9 months. In addition, he has noted difficulty with fine motor control, he has noticed neck stiffness and left hand stiffness and difficulty performing fine motor skills with the left hand. I reviewed your office note from 01/17/2018. He has had radiating neck pain to the left arm. He has had physical therapy for this. His neck pain has improved. He fell in 2017 and had neck pain. He had a X ray neck on 03/26/17: IMPRESSION: No acute findings.   Mild spondylosis of the cervical spine with moderate to severe multilevel bilateral neural foraminal narrowing as described.   He denies a family history of Parkinson's disease or tremor. He denies any recent falls. He denies any constipation. Mood is fairly stable but he does admit to having a longer standing history of anxiety. He takes Ativan  as needed for this. He also reports claustrophobia and having had difficulty going through an MRI in the past.   He has had some issues with becoming slower in his mobility. His brother recently commented on this. He is the youngest of 4 siblings, he has an older brother, then his sister, then another brother. He lives with his family which includes his wife and 87 year old daughter and 52 year old son. He works as a Insurance claims handler. He is a nonsmoker and drinks alcohol in the form of wine, maybe 3 glasses per week on average, caffeine in the form of  coffee, 3 cups per day on average. He tries to hydrate well. He exercises regularly.  His Past Medical History Is Significant For: Past Medical History:  Diagnosis Date   Family history of prostate cancer     DRE WNL; PSA never > 0.38   Fasting hyperglycemia     PMH of   GERD (gastroesophageal reflux disease)    Parkinson's disease (HCC)    Tinnitus    w/o hearing loss    His Past Surgical History Is Significant For: Past Surgical History:  Procedure Laterality Date   COLONOSCOPY  05/2013   Neg, Dr Abran   KNEE SURGERY Right    TONSILLECTOMY AND  ADENOIDECTOMY     VASECTOMY      His Family History Is Significant For: Family History  Problem Relation Age of Onset   COPD Mother        smoker   Prostate cancer Father 81       no longer with prostate cancer   Breast cancer Sister    COPD Paternal Aunt        smoker   Stroke Maternal Grandfather         late 16s   Heart disease Neg Hx    Colon cancer Neg Hx    Esophageal cancer Neg Hx    Pancreatic cancer Neg Hx    Rectal cancer Neg Hx    Stomach cancer Neg Hx    Diabetes Neg Hx    Other Neg Hx        low testosterone    Parkinson's disease Neg Hx     His Social History Is Significant For: Social History   Socioeconomic History   Marital status: Married    Spouse name: Not on file   Number of children: Not on file   Years of education: Not on file   Highest education level: Associate degree: academic program  Occupational History   Not on file  Tobacco Use   Smoking status: Never   Smokeless tobacco: Never  Substance and Sexual Activity   Alcohol use: Yes    Comment: very rare wine , < one per month per pt.   Drug use: No   Sexual activity: Yes  Other Topics Concern   Not on file  Social History Narrative   Caffeine: 2-3 cups coffee/day   Social Drivers of Health   Financial Resource Strain: Low Risk  (05/16/2022)   Overall Financial Resource Strain (CARDIA)    Difficulty of Paying Living Expenses: Not hard at all  Food Insecurity: Low Risk  (04/20/2023)   Received from [facility]   Hunger Vital Sign    Within the past 12 months, you worried that your food would run out before you got money to buy more: Never true    Within the past 12 months, the food you bought just didn't last and you didn't have money to get more. : Never true  Transportation Needs: No Transportation Needs (04/20/2023)   Received from [facility]    In the past 12 months, has lack of reliable transportation kept you from medical appointments, meetings, work or  from getting things needed for daily living? : No  Physical Activity: Sufficiently Active (05/16/2022)   Exercise Vital Sign    Days of Exercise per Week: 5 days    Minutes of Exercise per Session: 40 min  Stress: No Stress Concern Present (05/16/2022)   [employer] of Occupational Health - Occupational Stress Questionnaire  Feeling of Stress : Not at all  Social Connections: Moderately Integrated (05/16/2022)   Social Connection and Isolation Panel    Frequency of Communication with Friends and Family: Once a week    Frequency of Social Gatherings with Friends and Family: Once a week    Attends Religious Services: More than 4 times per year    Active Member of [employer] or Organizations: Yes    Attends Engineer, structural: More than 4 times per year    Marital Status: Married    His Allergies Are:  Allergies  Allergen Reactions   Penicillins     ? Reaction @ age 69  :   His Current Medications Are:  Outpatient Encounter Medications as of 10/01/2023  Medication Sig   amantadine (SYMMETREL) 100 MG capsule Take 100 mg by mouth daily.   carbidopa -levodopa  (SINEMET  CR) 50-200 MG tablet Take 1 tablet by mouth at bedtime.   carbidopa -levodopa  (SINEMET  IR) 25-100 MG tablet Take 1 tablet by mouth 4 (four) times daily.   chlorhexidine (PERIDEX) 0.12 % solution Use as directed in the mouth or throat daily at 6 (six) AM.   escitalopram  (LEXAPRO ) 10 MG tablet TAKE 1.5 TABLET BY MOUTH DAILY   gabapentin  (NEURONTIN ) 300 MG capsule TAKE 1 CAPSULE BY MOUTH 2 TIMES A DAY   Iron , Ferrous Sulfate , 325 (65 Fe) MG TABS Take 325 mg by mouth daily.   LORazepam  (ATIVAN ) 0.5 MG tablet TAKE ONE TABLET BY MOUTH TWICE A DAY AS NEEDED FOR ANXIETY   rivaroxaban  (XARELTO ) 20 MG TABS tablet Take 1 tablet (20 mg total) by mouth daily with supper.   SODIUM FLUORIDE 5000 SENSITIVE 1.1-5 % GEL daily at 6 (six) AM.   Cholecalciferol (VITAMIN D3 MAXIMUM STRENGTH) 125 MCG (5000 UT) capsule Take 5,000 Units by  mouth daily. (Patient not taking: Reported on 10/01/2023)   coconut oil OIL Apply 1 Application topically as needed. (Patient not taking: Reported on 10/01/2023)   Docosahexaenoic Acid (DHA PO) Take 1 tablet by mouth daily at 6 (six) AM. Unsure dose (Patient not taking: Reported on 10/01/2023)   rOPINIRole  (REQUIP ) 4 MG tablet Take 1 tablet (4 mg total) by mouth at bedtime. (Patient not taking: Reported on 10/01/2023)   Turmeric (QC TUMERIC COMPLEX PO) Take 1 tablet by mouth daily at 6 (six) AM. Unsure dose (Patient not taking: Reported on 10/01/2023)   No facility-administered encounter medications on file as of 10/01/2023.  :  Review of Systems:  Out of a complete 14 point review of systems, all are reviewed and negative with the exception of these symptoms as listed below:  Review of Systems  Neurological:        Patient is here with wife [name] for follow-up of PD. He reports slurred speech and stiffness in shoulders. He states the usual. His wife mentions he has slumping of the shoulders. He takes a lot of small steps like in the evenings when he gets home. He has bouts of insomnia and RLS which have improved but are still present.     Objective:  Neurological Exam  Physical Exam Physical Examination:   Vitals:   10/01/23 1314  BP: 112/74  Pulse: 68    General Examination: The patient is a very pleasant 63 y.o. male in no acute distress. He appears well-developed and well-nourished and well groomed.   HEENT: Normocephalic, atraumatic, pupils are equal, round and reactive to light and accommodation. He has moderate facial masking and decreased eye blink rate.  Tracking mildly  slow but no nystagmus is noted.  Hearing is grossly intact. Face is otherwise symmetric, no dysarthria, mild to moderate hypophonia. He has moderate nuchal rigidity. No carotid bruits. Airway examination reveals no significant mouth dryness, otherwise tongue protrudes centrally and palate elevates symmetrically, and  no lip, neck or jaw tremor. No sialorrhea.   Chest: Clear to auscultation without wheezing, rhonchi or crackles noted.   Heart: S1+S2+0, regular and normal without murmurs, rubs or gallops noted.   Abdomen: Soft, non-tender and non-distended.   Extremities: There is no pitting edema in the distal lower extremities bilaterally.   Skin: Warm and dry without trophic changes noted.   Musculoskeletal: exam reveals increase in stiffness in the left upper extremity with trigger finger left ring finger, stable.   Neurologically: Mental status: The patient is awake, alert and oriented in all 4 spheres. His immediate and remote memory, attention, language skills and fund of knowledge are appropriate. There is some evidence of bradyphrenia.    (On 04/03/2018: on Archimedes spiral drawing he has no significant trembling with either hand, handwriting with the right hand is legible, not tremulous, slightly micrographic.)   He has no obvious resting tremor, perhaps slight and intermittent in the left upper extremity.  No significant postural or action tremor. Cranial nerves II - XII are as described above under HEENT exam. At baseline, mildly elevated right shoulder noted, unchanged.  Motor exam: Normal bulk, and strength, tone is increased in both upper extremities, more noticeable on the left upper extremity compared to the right.  He has no obvious resting tremor, slight postural tremor, fine motor skills are moderately impaired on the left side, slightly better on the right. Cerebellar testing: No dysmetria or intention tremor. There is no truncal or gait ataxia. Sensory exam: intact to light touch in the upper and lower extremities. Gait, station and balance: He stands without difficulty, posture is mild to moderately stooped for age. Gait shows fairly good pace and stride length but near-absence of arm swing on the left, decreased arm swing on the right. Balance appears preserved but turns are  slightly challenging for him.  No walking aid.   Assessment and plan:    In summary, Mark [name] is a 63 year old right-handed gentleman with an underlying medical history of neck pain, reflux disease, chronic tinnitus, anxiety, and allergic rhinitis, who presents for follow-up consultation of his left-sided predominant Parkinson's disease with akinetic-rigid presentation, complicated by restless leg syndrome, sleep disturbance, and anxiety.  His DaT scan from 08/07/18 showed near absent activity in the putamen and asymmetric decreased activity in the head of the RIGHT caudate nucleus.   He was on Neupro , which we started in May 2020, we increased this to 8 mg strength once daily in December 2020 but he started having lightheadedness and more dizziness.    He works full-time and has to walk and stand a lot at work.    He also exercises on a regular basis.    He had Covid infection in July 2021. He was partially vaccinated in April 2021.   We eventually decided to stop the Neupro  and started immediate release Requip . We tried him on long-acting ropinirole  in the interim which he did not tolerate as well.    He on ropinirole  for his restless leg symptoms up to 4 mg at bedtime but is currently off of it for the past few months.    Melatonin did not help his sleep.    He has had intermittent paresthesias on the  left side.  He tried gabapentin  briefly in September 2020, I suggested we retry gabapentin  at 300 mg at bedtime.  He tried it at 200 mg strength and then we increased it to 300 mg at bedtime back in November 2023.  He is now on 600 mg at bedtime.  We increased his Sinemet  from 1 pill 3 times daily to 1 pill 4 times daily in September 2023.  He was started on amantadine by Dr. Rosalia a few years ago, currently on 100 mg in the late morning once daily.  We talked about the importance of maintaining a healthy lifestyle and good exercise regimen.    We will reinitiate physical therapy.     He has seen a hand surgeon for his trigger finger but did not proceed with any surgery.    He is advised to follow-up routinely in about 6-8 months to see one of our nurse practitioners, we will maintain his current medication regimen.   I answered all their questions today and the patient and his wife were in agreement.  I spent 45 minutes in total face-to-face time and in reviewing records during pre-charting, more than 50% of which was spent in counseling and coordination of care, reviewing test results, reviewing medications and treatment regimen and/or in discussing or reviewing the diagnosis of PD, the prognosis and treatment options. Pertinent laboratory and imaging test results that were available during this visit with the patient were reviewed by me and considered in my medical decision making (see chart for details).

## 2023-10-02 ENCOUNTER — Encounter: Payer: Self-pay | Admitting: Family

## 2023-10-02 ENCOUNTER — Telehealth: Payer: Self-pay | Admitting: *Deleted

## 2023-10-02 NOTE — Telephone Encounter (Signed)
 Pt form ready for p/u

## 2023-10-09 ENCOUNTER — Other Ambulatory Visit: Payer: Self-pay | Admitting: Medical

## 2023-10-09 NOTE — Telephone Encounter (Signed)
 Requesting: ativan  Contract:09/10/23 UDS:11/24/2022 Last Visit:05/17/23 Next Visit:n//a Last Refill:09/10/23  Please Advise

## 2023-10-10 NOTE — Telephone Encounter (Signed)
 Rx refill sent. Will you make sure he get uds within next month as nurse visit. He will be due in October.

## 2023-10-11 ENCOUNTER — Other Ambulatory Visit

## 2023-10-11 ENCOUNTER — Telehealth: Payer: Self-pay

## 2023-10-11 DIAGNOSIS — Z79899 Other long term (current) drug therapy: Secondary | ICD-10-CM

## 2023-10-11 NOTE — Telephone Encounter (Signed)
 Pt notified via mychart to schedule lab

## 2023-10-11 NOTE — Telephone Encounter (Signed)
 UDS placed - pt sent mychart message to schedule lab appt

## 2023-10-13 LAB — DRUG MONITORING PANEL 376104, URINE
Amphetamines: NEGATIVE ng/mL (ref <500)
Barbiturates: NEGATIVE ng/mL (ref <300)
Benzodiazepines: NEGATIVE ng/mL (ref <100)
Cocaine Metabolite: NEGATIVE ng/mL (ref <150)
Desmethyltramadol: NEGATIVE ng/mL (ref <100)
Opiates: NEGATIVE ng/mL (ref <100)
Oxycodone: NEGATIVE ng/mL (ref <100)
Tramadol: NEGATIVE ng/mL (ref <100)

## 2023-10-13 LAB — DM TEMPLATE

## 2023-10-16 ENCOUNTER — Other Ambulatory Visit (HOSPITAL_BASED_OUTPATIENT_CLINIC_OR_DEPARTMENT_OTHER): Payer: Self-pay

## 2023-10-16 ENCOUNTER — Other Ambulatory Visit: Payer: Self-pay | Admitting: Family

## 2023-10-16 MED ORDER — RIVAROXABAN 20 MG PO TABS
20.0000 mg | ORAL_TABLET | Freq: Every day | ORAL | 5 refills | Status: AC
  Filled 2023-10-16: qty 30, 30d supply, fill #0
  Filled 2023-11-16: qty 30, 30d supply, fill #1
  Filled 2023-12-18: qty 30, 30d supply, fill #2
  Filled 2024-01-21: qty 30, 30d supply, fill #3
  Filled 2024-02-21: qty 30, 30d supply, fill #4
  Filled 2024-03-21: qty 30, 30d supply, fill #5

## 2023-10-18 ENCOUNTER — Other Ambulatory Visit (HOSPITAL_BASED_OUTPATIENT_CLINIC_OR_DEPARTMENT_OTHER): Payer: Self-pay

## 2023-10-26 ENCOUNTER — Encounter: Payer: Self-pay | Admitting: Family

## 2023-10-30 ENCOUNTER — Other Ambulatory Visit: Payer: Self-pay | Admitting: Adult Health

## 2023-10-30 DIAGNOSIS — G20A2 Parkinson's disease without dyskinesia, with fluctuations: Secondary | ICD-10-CM

## 2023-11-01 ENCOUNTER — Other Ambulatory Visit: Payer: Self-pay | Admitting: Adult Health

## 2023-11-01 DIAGNOSIS — G20A2 Parkinson's disease without dyskinesia, with fluctuations: Secondary | ICD-10-CM

## 2023-11-09 ENCOUNTER — Other Ambulatory Visit: Payer: Self-pay | Admitting: Neurology

## 2023-11-09 ENCOUNTER — Encounter: Payer: Self-pay | Admitting: Internal Medicine

## 2023-11-10 ENCOUNTER — Other Ambulatory Visit: Payer: Self-pay | Admitting: Neurology

## 2023-12-06 ENCOUNTER — Other Ambulatory Visit: Payer: Self-pay | Admitting: Medical

## 2023-12-06 NOTE — Telephone Encounter (Signed)
 Requesting: ativan  Contract:12/18/22 UDS:12/18/22 Last Visit:05/17/23 Next Visit:n/a Last Refill:10/10/23  Please Advise

## 2023-12-07 NOTE — Telephone Encounter (Signed)
Rx refill sent to pt pharmacy 

## 2023-12-09 ENCOUNTER — Encounter: Payer: Self-pay | Admitting: Medical

## 2023-12-09 ENCOUNTER — Other Ambulatory Visit: Payer: Self-pay | Admitting: Medical

## 2023-12-14 ENCOUNTER — Ambulatory Visit: Admitting: Medical

## 2023-12-14 VITALS — BP 122/80 | HR 59 | Temp 97.9°F | Resp 15 | Ht 72.0 in | Wt 169.2 lb

## 2023-12-14 DIAGNOSIS — Z1211 Encounter for screening for malignant neoplasm of colon: Secondary | ICD-10-CM

## 2023-12-14 DIAGNOSIS — Z79899 Other long term (current) drug therapy: Secondary | ICD-10-CM | POA: Diagnosis not present

## 2023-12-14 DIAGNOSIS — Z23 Encounter for immunization: Secondary | ICD-10-CM | POA: Diagnosis not present

## 2023-12-14 DIAGNOSIS — F419 Anxiety disorder, unspecified: Secondary | ICD-10-CM | POA: Diagnosis not present

## 2023-12-14 DIAGNOSIS — G20A1 Parkinson's disease without dyskinesia, without mention of fluctuations: Secondary | ICD-10-CM

## 2023-12-14 NOTE — Patient Instructions (Signed)
 Anxiety with some depressed mood. Managed with Ativan  and Lexapro . Reports improvement in anxiety and mood, partly due to Parkinson's and work stress. Inconsistent support at work. - Continue Ativan  0.5 mg twice daily. - Continue Lexapro  10 mg daily.  Parkinson's disease Managed with Sinemet . Restless leg symptoms resolved after stopping amantadine and gabapentin . Transitioning care to Dr. Rosalia. - Continue Sinemet  four times daily. - Transition care to Dr. Rosalia for neurology follow-up. - Request neurology notes to be sent to primary care.  History of deep vein thrombosis on anticoagulation On Xarelto  with no leg pain or discomfort, - Continue Xarelto  for anticoagulation.  Immunization: Influenza vaccine Received influenza vaccine during this visit. - Ensure annual influenza vaccination.  General Health Maintenance Due for wellness exam and colonoscopy, last colonoscopy in 2015. - Schedule wellness exam. - Schedule colonoscopy.  Follow-Up Wellness exam and control med visit planned for April or May if stable. - Plan wellness exam and control med visit for April or May.

## 2023-12-14 NOTE — Progress Notes (Signed)
 Subjective:    Patient ID: Dicky Boer, male    DOB: 17-Nov-1960, 64 y.o.   MRN: [id number]  HPI  Johndavid Geralds is a 63 year old male with Parkinson's disease and anxiety who presents for a control medication visit and flu vaccination.  He is currently taking Ativan  0.5 mg twice daily and Lexapro  10 mg daily for anxiety and mood stabilization. He attributes some anxiety to Parkinson's disease and work-related stress.  His Parkinson's disease is managed with Sinemet  four times a day. Previously, he was on amantadine and gabapentin , but these have been discontinued as his restless leg symptoms have subsided. He is transitioning care to a new neurologist, Dr. Rosalia, for annual follow-ups.  He has a history of deep vein thrombosis and is on Xarelto . He reports no pain or discomfort in his leg.  He received a flu vaccine today as part of his general health maintenance. He has been working at his current job for 17 years and sometimes receives inconsistent help.        Review of Systems  Constitutional:  Negative for chills, fatigue and fever.  HENT:  Negative for congestion and ear pain.   Respiratory:  Negative for cough, chest tightness and wheezing.   Cardiovascular:  Negative for chest pain.  Gastrointestinal:  Negative for abdominal pain, nausea and vomiting.  Genitourinary:  Negative for dysuria and flank pain.  Musculoskeletal:  Negative for back pain and joint swelling.  Neurological:  Negative for dizziness, numbness and headaches.  Hematological:  Negative for adenopathy.  Psychiatric/Behavioral:  Negative for behavioral problems, decreased concentration and hallucinations.        Past Medical History:  Diagnosis Date   Family history of prostate cancer     DRE WNL; PSA never > 0.38   Fasting hyperglycemia     PMH of   GERD (gastroesophageal reflux disease)    Parkinson's disease (HCC)    Tinnitus    w/o hearing loss     Social History    Socioeconomic History   Marital status: Married    Spouse name: Not on file   Number of children: Not on file   Years of education: Not on file   Highest education level: Associate degree: academic program  Occupational History   Not on file  Tobacco Use   Smoking status: Never   Smokeless tobacco: Never  Substance and Sexual Activity   Alcohol use: Yes    Comment: very rare wine , < one per month per pt.   Drug use: No   Sexual activity: Yes  Other Topics Concern   Not on file  Social History Narrative   Caffeine: 2-3 cups coffee/day   Social Drivers of Health   Financial Resource Strain: Low Risk  (05/16/2022)   Overall Financial Resource Strain (CARDIA)    Difficulty of Paying Living Expenses: Not hard at all  Food Insecurity: Low Risk  (04/20/2023)   Received from [facility]   Hunger Vital Sign    Within the past 12 months, you worried that your food would run out before you got money to buy more: Never true    Within the past 12 months, the food you bought just didn't last and you didn't have money to get more. : Never true  Transportation Needs: No Transportation Needs (04/20/2023)   Received from [facility]    In the past 12 months, has lack of reliable transportation kept you from medical appointments, meetings, work  or from getting things needed for daily living? : No  Physical Activity: Sufficiently Active (05/16/2022)   Exercise Vital Sign    Days of Exercise per Week: 5 days    Minutes of Exercise per Session: 40 min  Stress: No Stress Concern Present (05/16/2022)   [employer] of Occupational Health - Occupational Stress Questionnaire    Feeling of Stress : Not at all  Social Connections: Moderately Integrated (05/16/2022)   Social Connection and Isolation Panel    Frequency of Communication with Friends and Family: Once a week    Frequency of Social Gatherings with Friends and Family: Once a week    Attends Religious Services: More than  4 times per year    Active Member of [employer] or Organizations: Yes    Attends Banker Meetings: More than 4 times per year    Marital Status: Married  Catering Manager Violence: Not At Risk (04/04/2023)   Humiliation, Afraid, Rape, and Kick questionnaire    Fear of Current or Ex-Partner: No    Emotionally Abused: No    Physically Abused: No    Sexually Abused: No    Past Surgical History:  Procedure Laterality Date   COLONOSCOPY  05/2013   Neg, Dr Abran   KNEE SURGERY Right    TONSILLECTOMY AND ADENOIDECTOMY     VASECTOMY      Family History  Problem Relation Age of Onset   COPD Mother        smoker   Prostate cancer Father 55       no longer with prostate cancer   Breast cancer Sister    COPD Paternal Aunt        smoker   Stroke Maternal Grandfather         late 66s   Heart disease Neg Hx    Colon cancer Neg Hx    Esophageal cancer Neg Hx    Pancreatic cancer Neg Hx    Rectal cancer Neg Hx    Stomach cancer Neg Hx    Diabetes Neg Hx    Other Neg Hx        low testosterone    Parkinson's disease Neg Hx     Allergies  Allergen Reactions   Penicillins     ? Reaction @ age 65    Current Outpatient Medications on File Prior to Visit  Medication Sig Dispense Refill   amantadine (SYMMETREL) 100 MG capsule Take 100 mg by mouth daily.     carbidopa -levodopa  (SINEMET  CR) 50-200 MG tablet Take 1 tablet by mouth at bedtime. 30 tablet 11   carbidopa -levodopa  (SINEMET  IR) 25-100 MG tablet TAKE 1 TABLET BY MOUTH FOUR TIMES A DAY 120 tablet 11   chlorhexidine (PERIDEX) 0.12 % solution Use as directed in the mouth or throat daily at 6 (six) AM.     escitalopram  (LEXAPRO ) 10 MG tablet TAKE 1.5 TABLET BY MOUTH DAILY 45 tablet 5   gabapentin  (NEURONTIN ) 300 MG capsule TAKE 1 CAPSULE BY MOUTH 2 TIMES A DAY 60 capsule 5   Iron , Ferrous Sulfate , 325 (65 Fe) MG TABS Take 325 mg by mouth daily. 30 tablet 1   LORazepam  (ATIVAN ) 0.5 MG tablet TAKE 1 TABLET BY MOUTH 2 TIMES A  DAY AS NEEDED FOR ANXIETY 60 tablet 0   rivaroxaban  (XARELTO ) 20 MG TABS tablet Take 1 tablet (20 mg total) by mouth daily with supper. 30 tablet 5   SODIUM FLUORIDE 5000 SENSITIVE 1.1-5 % GEL daily at 6 (six) AM.  Cholecalciferol (VITAMIN D3 MAXIMUM STRENGTH) 125 MCG (5000 UT) capsule Take 5,000 Units by mouth daily. (Patient not taking: Reported on 12/14/2023)     coconut oil OIL Apply 1 Application topically as needed. (Patient not taking: Reported on 12/14/2023)     Docosahexaenoic Acid (DHA PO) Take 1 tablet by mouth daily at 6 (six) AM. Unsure dose (Patient not taking: Reported on 12/14/2023)     rOPINIRole  (REQUIP ) 4 MG tablet Take 1 tablet (4 mg total) by mouth at bedtime. (Patient not taking: Reported on 12/14/2023) 30 tablet 11   Turmeric (QC TUMERIC COMPLEX PO) Take 1 tablet by mouth daily at 6 (six) AM. Unsure dose (Patient not taking: Reported on 12/14/2023)     No current facility-administered medications on file prior to visit.    BP 122/80   Pulse (!) 59   Temp 97.9 F (36.6 C) (Oral)   Resp 15   Ht 6' (1.829 m)   Wt 169 lb 3.2 oz (76.7 kg)   SpO2 99%   BMI 22.95 kg/m          Objective:   Physical Exam  General Mental Status- Alert. General Appearance- Not in acute distress.   Skin General: Color- Normal Color. Moisture- Normal Moisture.  Neck No JVD.  Chest and Lung Exam Auscultation: Breath Sounds:-CTA  Cardiovascular Auscultation:Rythm- RRR Murmurs & Other Heart Sounds:Auscultation of the heart reveals- No Murmurs.  Abdomen Inspection:-Inspeection Normal. Palpation/Percussion:Note:No mass. Palpation and Percussion of the abdomen reveal- Non Tender, Non Distended + BS, no rebound or guarding.   Neurologic Cranial Nerve exam:- CN III-XII intact(No nystagmus), symmetric smile. Strength:- 5/5 equal and symmetric strength both upper and lower extremities.       Assessment & Plan:   Anxiety with some depressed mood. Managed with Ativan  and  Lexapro . Reports improvement in anxiety and mood, partly due to Parkinson's and work stress. Inconsistent support at work. - Continue Ativan  0.5 mg twice daily. - Continue Lexapro  10 mg daily.  Parkinson's disease Managed with Sinemet . Restless leg symptoms resolved after stopping amantadine and gabapentin . Transitioning care to Dr. Rosalia. - Continue Sinemet  four times daily. - Transition care to Dr. Rosalia for neurology follow-up. - Request neurology notes to be sent to primary care.  History of deep vein thrombosis on anticoagulation On Xarelto  with no leg pain or discomfort, - Continue Xarelto  for anticoagulation.  Immunization: Influenza vaccine Received influenza vaccine during this visit. - Ensure annual influenza vaccination.  General Health Maintenance Due for wellness exam and colonoscopy, last colonoscopy in 2015. - Schedule wellness exam. - Schedule colonoscopy.  Follow-Up Wellness exam and control med visit planned for April or May if stable. - Plan wellness exam and control med visit for April or May.  Shahiem Bedwell, PA-C

## 2023-12-26 ENCOUNTER — Encounter: Payer: Self-pay | Admitting: Medical

## 2023-12-26 ENCOUNTER — Ambulatory Visit: Payer: Self-pay

## 2023-12-26 NOTE — Telephone Encounter (Signed)
 FYI Only or Action Required?: FYI only for provider: appointment scheduled on 11.14.25.  Patient was last seen in primary care on 12/14/2023 by Dorina Loving, PA-C.  Called Nurse Triage reporting Back Pain.  Symptoms began a week ago.  Interventions attempted: OTC medications: ibuprofen, Rest, hydration, or home remedies, Ice/heat application, and Other: biofreeze, pain patches.  Symptoms are: unchanged.  Triage Disposition: See PCP When Office is Open (Within 3 Days)  Patient/caregiver understands and will follow disposition?: YES  Copied from CRM [id number]. Topic: Clinical - Red Word Triage >> Dec 26, 2023 11:52 AM Mesmerise C wrote: [facility] that prompted transfer to Nurse Triage: Patient had a back injury 10 days ago and has been experiencing back pain in his lower back every day Reason for Disposition  [1] MODERATE back pain (e.g., interferes with normal activities) AND [2] present > 3 days  Answer Assessment - Initial Assessment Questions PT states that his MIL fell and he picked her up and felt a pop. He states this is the second time he has felt this pop in the last 5-6 months.      1. ONSET: When did the pain begin? (e.g., minutes, hours, days)     10/ days 2. LOCATION: Where does it hurt? (upper, mid or lower back)     Right lower back 3. SEVERITY: How bad is the pain?  (e.g., Scale 1-10; mild, moderate, or severe)     10 at times 7/8 most of the time 4. PATTERN: Is the pain constant? (e.g., yes, no; constant, intermittent)      Pretty constant 7/8 and then shoots up to 10 5. RADIATION: Does the pain shoot into your legs or somewhere else?     no 6. CAUSE:  What do you think is causing the back pain?      Lift MIL 7. BACK OVERUSE:  Any recent lifting of heavy objects, strenuous work or exercise?     Lifting MIL 8. MEDICINES: What have you taken so far for the pain? (e.g., nothing, acetaminophen, NSAIDS)     Biofreeze, patches, ibuprofen 9.  NEUROLOGIC SYMPTOMS: Do you have any weakness, numbness, or problems with bowel/bladder control?     no 10. OTHER SYMPTOMS: Do you have any other symptoms? (e.g., fever, abdomen pain, burning with urination, blood in urine)       no  Protocols used: Back Pain-A-AH

## 2023-12-28 ENCOUNTER — Ambulatory Visit: Admitting: Medical

## 2023-12-28 VITALS — BP 120/78 | HR 62 | Temp 97.8°F | Resp 16 | Ht 72.0 in | Wt 165.0 lb

## 2023-12-28 DIAGNOSIS — M545 Low back pain, unspecified: Secondary | ICD-10-CM

## 2023-12-28 DIAGNOSIS — G8929 Other chronic pain: Secondary | ICD-10-CM

## 2023-12-28 MED ORDER — METHYLPREDNISOLONE 4 MG PO TABS: ORAL_TABLET | ORAL | 0 refills | Status: AC

## 2023-12-28 NOTE — Progress Notes (Signed)
   Subjective:    Patient ID: Mark [name], male    DOB: 1960-07-21, 63 y.o.   MRN: [id number]  HPI  Randal Goens is a 63 year old male with a history of DVT on Xarelto  who presents with acute exacerbation of chronic back pain.  2 months ago, he experienced severe back pain after lifting a sofa, which initially subsided but recurred a week and a half ago after lifting his mother-in-law. The pain persists with some improvement but not complete resolution. It is primarily on the right side of the lower back, occasionally radiating across the entire lower back. The pain is severe, especially when getting out of bed, and is rated 7 or 8 out of 10 when bending over. He cannot sleep on his right side due to the pain. There is no radiation of pain down his legs.  He takes ibuprofen 600 mg with minimal relief and uses Biofreeze for additional relief. 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     Review of Systems See hpi    Objective:   Physical Exam   General Appearance- Not in acute distress.    Chest and Lung Exam Auscultation: Breath sounds:-Normal. Clear even and unlabored. Adventitious sounds:- No Adventitious sounds.  Cardiovascular Auscultation:Rythm - Regular, rate and rythm. Heart Sounds -Normal heart sounds.  Abdomen Inspection:-Inspection Normal.  Palpation/Perucssion: Palpation and Percussion of the abdomen reveal- Non Tender, No Rebound tenderness, No rigidity(Guarding) and No Palpable abdominal masses.  Liver:-Normal.  Spleen:- Normal.   Back Rt side lumbar spine tenderness to palpation. Pain on left leg straight leg lift. Pain severe on changing position supine to sitting.  Lower ext neurologic  L5-S1 sensation intact bilaterally. Normal patellar reflexes bilaterally. No foot drop bilaterally.       Assessment & Plan:   Low back pain, right-sided predominant, without  radiculopathy Chronic right-sided low back pain exacerbated by activity. Severe pain rated 7-8/10, worst upon rising. Brief 10 level pain on supine to sitting. No radiculopathy. Ibuprofen ineffective and not indicated due to Xarelto  interaction. Medrol  considered for inflammation. Muscle relaxants deferred due to Parkinson's. - Prescribed Medrol  dose pack: 6 tablets on day 1, decreasing by one tablet each day until completion. - Advised taking Medrol  with food in the morning. - Provided basic stretching exercises to be done as tolerated. - Advised coworkers to assist with lifting tasks. - Will consider lumbar x-ray if pain persists.  Follow up in 7-10 days or sooner if needed

## 2023-12-28 NOTE — Patient Instructions (Addendum)
 Low back pain, right-sided predominant, without radiculopathy Chronic right-sided low back pain exacerbated by activity. Severe pain rated 7-8/10, worst upon rising. Brief 10 level pain on supine to sitting. No radiculopathy. Ibuprofen ineffective and not indicated due to Xarelto  interaction. Medrol  considered for inflammation. Muscle relaxants deferred due to Parkinson's. - Prescribed Medrol  dose pack: 6 tablets on day 1, decreasing by one tablet each day until completion. - Advised taking Medrol  with food in the morning. - Provided basic stretching exercises to be done as tolerated. - Advised coworkers to assist with lifting tasks. - Will consider lumbar x-ray if pain persists.  Follow up in 7-10 days or sooner if needed   Back Exercises These exercises help to make your trunk and back strong. They also help to keep the lower back flexible. Doing these exercises can help to prevent or lessen pain in your lower back. If you have back pain, try to do these exercises 2-3 times each day or as told by your doctor. As you get better, do the exercises once each day. Repeat the exercises more often as told by your doctor. To stop back pain from coming back, do the exercises once each day, or as told by your doctor. Do exercises exactly as told by your doctor. Stop right away if you feel sudden pain or your pain gets worse. Exercises Single knee to chest Do these steps 3-5 times in a row for each leg: Lie on your back on a firm bed or the floor with your legs stretched out. Bring one knee to your chest. Grab your knee or thigh with both hands and hold it in place. Pull on your knee until you feel a gentle stretch in your lower back or butt. Keep doing the stretch for 10-30 seconds. Slowly let go of your leg and straighten it. Pelvic tilt Do these steps 5-10 times in a row: Lie on your back on a firm bed or the floor with your legs stretched out. Bend your knees so they point up to the ceiling.  Your feet should be flat on the floor. Tighten your lower belly (abdomen) muscles to press your lower back against the floor. This will make your tailbone point up to the ceiling instead of pointing down to your feet or the floor. Stay in this position for 5-10 seconds while you gently tighten your muscles and breathe evenly. Cat-cow Do these steps until your lower back bends more easily: Get on your hands and knees on a firm bed or the floor. Keep your hands under your shoulders, and keep your knees under your hips. You may put padding under your knees. Let your head hang down toward your chest. Tighten (contract) the muscles in your belly. Point your tailbone toward the floor so your lower back becomes rounded like the back of a cat. Stay in this position for 5 seconds. Slowly lift your head. Let the muscles of your belly relax. Point your tailbone up toward the ceiling so your back forms a sagging arch like the back of a cow. Stay in this position for 5 seconds.  Press-ups Do these steps 5-10 times in a row: Lie on your belly (face-down) on a firm bed or the floor. Place your hands near your head, about shoulder-width apart. While you keep your back relaxed and keep your hips on the floor, slowly straighten your arms to raise the top half of your body and lift your shoulders. Do not use your back muscles. You may change  where you place your hands to make yourself more comfortable. Stay in this position for 5 seconds. Keep your back relaxed. Slowly return to lying flat on the floor.  Bridges Do these steps 10 times in a row: Lie on your back on a firm bed or the floor. Bend your knees so they point up to the ceiling. Your feet should be flat on the floor. Your arms should be flat at your sides, next to your body. Tighten your butt muscles and lift your butt off the floor until your waist is almost as high as your knees. If you do not feel the muscles working in your butt and the back of your  thighs, slide your feet 1-2 inches (2.5-5 cm) farther away from your butt. Stay in this position for 3-5 seconds. Slowly lower your butt to the floor, and let your butt muscles relax. If this exercise is too easy, try doing it with your arms crossed over your chest. Belly crunches Do these steps 5-10 times in a row: Lie on your back on a firm bed or the floor with your legs stretched out. Bend your knees so they point up to the ceiling. Your feet should be flat on the floor. Cross your arms over your chest. Tip your chin a little bit toward your chest, but do not bend your neck. Tighten your belly muscles and slowly raise your chest just enough to lift your shoulder blades a tiny bit off the floor. Avoid raising your body higher than that because it can put too much stress on your lower back. Slowly lower your chest and your head to the floor. Back lifts Do these steps 5-10 times in a row: Lie on your belly (face-down) with your arms at your sides, and rest your forehead on the floor. Tighten the muscles in your legs and your butt. Slowly lift your chest off the floor while you keep your hips on the floor. Keep the back of your head in line with the curve in your back. Look at the floor while you do this. Stay in this position for 3-5 seconds. Slowly lower your chest and your face to the floor. Contact a doctor if: Your back pain gets a lot worse when you do an exercise. Your back pain does not get better within 2 hours after you exercise. If you have any of these problems, stop doing the exercises. Do not do them again unless your doctor says it is okay. Get help right away if: You have sudden, very bad back pain. If this happens, stop doing the exercises. Do not do them again unless your doctor says it is okay. This information is not intended to replace advice given to you by your health care provider. Make sure you discuss any questions you have with your health care provider. Document  Revised: 04/14/2020 Document Reviewed: 04/14/2020 Elsevier Patient Education  2024 [employer].

## 2024-01-02 ENCOUNTER — Inpatient Hospital Stay: Attending: Hematology & Oncology

## 2024-01-02 ENCOUNTER — Encounter: Payer: Self-pay | Admitting: Family

## 2024-01-02 ENCOUNTER — Inpatient Hospital Stay: Admitting: Family

## 2024-01-02 VITALS — BP 120/73 | HR 68 | Temp 98.0°F | Resp 17 | Wt 164.0 lb

## 2024-01-02 DIAGNOSIS — I82462 Acute embolism and thrombosis of left calf muscular vein: Secondary | ICD-10-CM | POA: Diagnosis not present

## 2024-01-02 LAB — CMP (CANCER CENTER ONLY)
ALT: <5 U/L (ref 0–44)
AST: 18 U/L (ref 15–41)
Albumin: 4.4 g/dL (ref 3.5–5.0)
Alkaline Phosphatase: 96 U/L (ref 38–126)
Anion gap: 12 (ref 5–15)
BUN: 13 mg/dL (ref 8–23)
CO2: 26 mmol/L (ref 22–32)
Calcium: 9.3 mg/dL (ref 8.9–10.3)
Chloride: 101 mmol/L (ref 98–111)
Creatinine: 0.82 mg/dL (ref 0.61–1.24)
GFR, Estimated: >60 mL/min (ref >60)
Glucose, Bld: 91 mg/dL (ref 70–99)
Potassium: 4 mmol/L (ref 3.5–5.1)
Sodium: 138 mmol/L (ref 135–145)
Total Bilirubin: 0.6 mg/dL (ref 0.0–1.2)
Total Protein: 7.3 g/dL (ref 6.5–8.1)

## 2024-01-02 LAB — CBC WITH DIFFERENTIAL (CANCER CENTER ONLY)
Abs Immature Granulocytes: 0.02 K/uL (ref 0.00–0.07)
Basophils Absolute: 0 K/uL (ref 0.0–0.1)
Basophils Relative: 1 %
Eosinophils Absolute: 0 K/uL (ref 0.0–0.5)
Eosinophils Relative: 0 %
HCT: 41.3 % (ref 39.0–52.0)
Hemoglobin: 13.7 g/dL (ref 13.0–17.0)
Immature Granulocytes: 0 %
Lymphocytes Relative: 25 %
Lymphs Abs: 1.7 K/uL (ref 0.7–4.0)
MCH: 31.9 pg (ref 26.0–34.0)
MCHC: 33.2 g/dL (ref 30.0–36.0)
MCV: 96.3 fL (ref 80.0–100.0)
Monocytes Absolute: 0.7 K/uL (ref 0.1–1.0)
Monocytes Relative: 10 %
Neutro Abs: 4.3 K/uL (ref 1.7–7.7)
Neutrophils Relative %: 64 %
Platelet Count: 179 K/uL (ref 150–400)
RBC: 4.29 MIL/uL (ref 4.22–5.81)
RDW: 11.8 % (ref 11.5–15.5)
WBC Count: 6.7 K/uL (ref 4.0–10.5)
nRBC: 0 % (ref 0.0–0.2)

## 2024-01-02 NOTE — Progress Notes (Signed)
 Hematology and Oncology Follow Up Visit  Mark [name] [id number] [birth year] 63 y.o. 01/02/2024   Principle Diagnosis:  LLE DVT Factor V Leiden, heterozygous    Current Therapy:        Xarelto  20 mg PO Daily   Interim History:  Mark [name] is here today for follow-up. He is doing well and has no complaints at this time.  He is tolerating his Xarelto  nicely and taking as prescribed.  No blood loss, abnormal bruising or petechiae.  No fever, chills, n/v, cough, rash, dizziness, SOB, chest pain, palpitations, abdominal pain or changes in bowel or bladder habits.  No swelling, tenderness, numbness or tingling in his extremities at this time.  No falls or syncope reported.  Appetite and hydration are good. Weight is stable at 164 lbs.   ECOG Performance Status: 0 - Asymptomatic  Medications:  Allergies as of 01/02/2024       Reactions   Penicillins    ? Reaction @ age 49        Medication List        Accurate as of January 02, 2024  3:13 PM. If you have any questions, ask your nurse or doctor.          amantadine 100 MG capsule Commonly known as: SYMMETREL Take 100 mg by mouth daily.   carbidopa -levodopa  50-200 MG tablet Commonly known as: SINEMET  CR Take 1 tablet by mouth at bedtime.   carbidopa -levodopa  25-100 MG tablet Commonly known as: SINEMET  IR TAKE 1 TABLET BY MOUTH FOUR TIMES A DAY   chlorhexidine 0.12 % solution Commonly known as: PERIDEX Use as directed in the mouth or throat daily at 6 (six) AM.   coconut oil Oil Apply 1 Application topically as needed.   DHA PO Take 1 tablet by mouth daily at 6 (six) AM. Unsure dose   escitalopram  10 MG tablet Commonly known as: LEXAPRO  TAKE 1.5 TABLET BY MOUTH DAILY   gabapentin  300 MG capsule Commonly known as: NEURONTIN  TAKE 1 CAPSULE BY MOUTH 2 TIMES A DAY   Iron  (Ferrous Sulfate ) 325 (65 Fe) MG Tabs Take 325 mg by mouth daily.   LORazepam  0.5 MG tablet Commonly known as: ATIVAN  TAKE 1  TABLET BY MOUTH 2 TIMES A DAY AS NEEDED FOR ANXIETY   methylPREDNISolone  4 MG tablet Commonly known as: Medrol  Standard 6 day taper dose pack   QC TUMERIC COMPLEX PO Take 1 tablet by mouth daily at 6 (six) AM. Unsure dose   rOPINIRole  4 MG tablet Commonly known as: REQUIP  Take 1 tablet (4 mg total) by mouth at bedtime.   Sodium Fluoride 5000 Sensitive 1.1-5 % Gel Generic drug: Sod Fluoride-Potassium Nitrate daily at 6 (six) AM.   Vitamin D3 Maximum Strength 125 MCG (5000 UT) capsule Generic drug: Cholecalciferol Take 5,000 Units by mouth daily.   Xarelto  20 MG Tabs tablet Generic drug: rivaroxaban  Take 1 tablet (20 mg total) by mouth daily with supper.        Allergies:  Allergies  Allergen Reactions   Penicillins     ? Reaction @ age 32    Past Medical History, Surgical history, Social history, and Family History were reviewed and updated.  Review of Systems: All other 10 point review of systems is negative.   Physical Exam:  weight is 164 lb (74.4 kg). His oral temperature is 98 F (36.7 C). His blood pressure is 120/73 and his pulse is 68. His respiration is 17 and oxygen saturation is 100%.   Wt  Readings from Last 3 Encounters:  01/02/24 164 lb (74.4 kg)  12/28/23 165 lb (74.8 kg)  12/14/23 169 lb 3.2 oz (76.7 kg)    Ocular: Sclerae unicteric, pupils equal, round and reactive to light Ear-nose-throat: Oropharynx clear, dentition fair Lymphatic: No cervical or supraclavicular adenopathy Lungs no rales or rhonchi, good excursion bilaterally Heart regular rate and rhythm, no murmur appreciated Abd soft, nontender, positive bowel sounds MSK no focal spinal tenderness, no joint edema Neuro: non-focal, well-oriented, appropriate affect Breasts: Deferred   Lab Results  Component Value Date   WBC 6.7 01/02/2024   HGB 13.7 01/02/2024   HCT 41.3 01/02/2024   MCV 96.3 01/02/2024   PLT 179 01/02/2024   Lab Results  Component Value Date   FERRITIN 28  05/17/2023   IRON  81 05/17/2023   TIBC 353 05/17/2023   IRONPCTSAT 23 05/17/2023   Lab Results  Component Value Date   RBC 4.29 01/02/2024   No results found for: KPAFRELGTCHN, LAMBDASER, KAPLAMBRATIO No results found for: IGGSERUM, IGA, IGMSERUM No results found for: STEPHANY CARLOTA BENSON MARKEL EARLA JOANNIE DOC VICK, SPEI   Chemistry      Component Value Date/Time   NA 138 07/02/2023 1458   K 3.8 07/02/2023 1458   CL 103 07/02/2023 1458   CO2 28 07/02/2023 1458   BUN 11 07/02/2023 1458   CREATININE 0.92 07/02/2023 1458      Component Value Date/Time   CALCIUM 9.1 07/02/2023 1458   ALKPHOS 45 07/02/2023 1458   AST 20 07/02/2023 1458   ALT 7 07/02/2023 1458   BILITOT 0.5 07/02/2023 1458       Impression and Plan: Mark [name] is a pleasant 63 yo caucasian gentleman with recent diagnosis of LLE DVT. Hyper coag panel revealed him to be heterozygous for the Factor V Leiden mutation.  He will continue his same regimen with Xarelto  20 mg PO daily.  We will repeat US  of LLE to reassess his response to anticoagulation.  Follow-up in 6 months.   Lauraine Pepper, NP 11/19/20253:13 PM

## 2024-01-06 ENCOUNTER — Other Ambulatory Visit: Payer: Self-pay | Admitting: Medical

## 2024-01-06 ENCOUNTER — Encounter: Payer: Self-pay | Admitting: Medical

## 2024-01-07 MED ORDER — LORAZEPAM 0.5 MG PO TABS: 0.5000 mg | ORAL_TABLET | Freq: Two times a day (BID) | ORAL | 0 refills | Status: DC | PRN

## 2024-01-07 NOTE — Telephone Encounter (Signed)
 Requesting: lorazepam  0.5mg  Contract:12/18/22 UDS: 10/11/23 Last Visit: 12/28/23 Next Visit: None Last Refill: 12/07/23 #60 and 0RF   Please Advise

## 2024-01-07 NOTE — Addendum Note (Signed)
 Addended by: DORINA DALLAS HERO on: 01/07/2024 08:39 PM   Modules accepted: Orders

## 2024-01-16 ENCOUNTER — Ambulatory Visit: Payer: Self-pay | Admitting: *Deleted

## 2024-01-16 ENCOUNTER — Encounter: Payer: Self-pay | Admitting: Student

## 2024-01-16 ENCOUNTER — Ambulatory Visit: Admitting: Student

## 2024-01-16 VITALS — BP 102/65 | HR 63 | Temp 97.8°F | Ht 72.0 in | Wt 163.4 lb

## 2024-01-16 DIAGNOSIS — J069 Acute upper respiratory infection, unspecified: Secondary | ICD-10-CM | POA: Insufficient documentation

## 2024-01-16 DIAGNOSIS — R051 Acute cough: Secondary | ICD-10-CM | POA: Insufficient documentation

## 2024-01-16 MED ORDER — BENZONATATE 100 MG PO CAPS: 100.0000 mg | ORAL_CAPSULE | Freq: Three times a day (TID) | ORAL | 0 refills | Status: AC | PRN

## 2024-01-16 NOTE — Progress Notes (Signed)
 No chief complaint on file.   Mark [name] here for URI complaints. Sunday  History of Present Illness Mark [name] is a 63 year old male who presents with upper respiratory symptoms.  Symptoms began Sunday afternoon with a sore throat that has persisted and worsened, described as intermittent. He also has a productive coug worse at night and in the morning, without shortness of breath. He reports nasal congestion without sinus pain or pressure. He has had no sick contacts and no allergies. COVID-19 and influenza tests at home were negative.   Duration: 4 days  Associated symptoms: sinus congestion, sore throat, and productive cough- yellow/ brown mucus,  Denies: subjective fever, sinus pain, ear fullness, SOB Treatment to date: n/a Sick contacts: uncertian  Patient denies fever, chills, SOB, CP, palpitations, dyspnea, edema, HA, vision changes, N/V/D, abdominal pain, urinary symptoms, rash, weight changes,   Past Medical History:  Diagnosis Date   Family history of prostate cancer     DRE WNL; PSA never > 0.38   Fasting hyperglycemia     PMH of   GERD (gastroesophageal reflux disease)    Parkinson's disease (HCC)    Tinnitus    w/o hearing loss    Objective BP 102/65   Pulse 63   Temp 97.8 F (36.6 C)   Ht 6' (1.829 m)   Wt 163 lb 6.4 oz (74.1 kg)   SpO2 100%   BMI 22.16 kg/m  General: Awake, alert, appears stated age HEENT: AT, [place], ears patent b/l and TM's neg, nares patent w/o discharge, pharynx pink and without exudates, MMM Neck: No masses or asymmetry Heart: RRR Lungs: CTAB, no accessory muscle use Psych: Age appropriate judgment and insight, normal mood and affect  Viral upper respiratory tract infection  Acute cough - Plan: benzonatate  (TESSALON ) 100 MG capsule  Likely viral etiology. COVID and flu tests negative.  - Prescribed Tessalon  Perles for cough, up to three times a day. - Advised use of Mucinex if congestion worsens. - Encouraged  increased hydration. - Instructed to monitor symptoms and report if symptoms persist beyond next week, worsen, or if shortness of breath or fever develops.  Continue to push fluids, practice good hand hygiene, cover mouth when coughing. F/u prn. If starting to experience fevers, shaking, or shortness of breath, seek immediate care. Pt voiced understanding and agreement to the plan.  Harlene LITTIE Jolly, DNP, AGNP-C 01/16/24 11:45 AM

## 2024-01-16 NOTE — Telephone Encounter (Signed)
 FYI Only or Action Required?: FYI only for provider: appointment scheduled on 01/16/24.  Patient was last seen in primary care on 12/28/2023 by Dorina Loving, PA-C.  Called Nurse Triage reporting Cough. Body aches, runny nose. Productive cough - brown mucus  Symptoms began several days ago. Sunday   Interventions attempted: OTC medications: allegra, sudafed, cough drops.  Symptoms are: gradually worsening.  Triage Disposition: See Physician Within 24 Hours  Patient/caregiver understands and will follow disposition?: Yes                Copied from CRM [id number]. Topic: Clinical - Red Word Triage >> Jan 16, 2024  7:36 AM Robinson H wrote: [facility] that prompted transfer to Nurse Triage: Coughing up brown mucus, body aches, congestion, runny nose, patient has Parkinson's Reason for Disposition  Coughing up rusty-colored (reddish-brown) sputum  Answer Assessment - Initial Assessment Questions Appt scheduled today per wife request with other provider. None available with PCP until Friday . Patient hx Parkinsons. Wife reports pt took at home covid, flu A&B test and was negative on Monday.    1. ONSET: When did the cough begin?      Sunday  2. SEVERITY: How bad is the cough today?      Coughing spell at times 3. SPUTUM: Describe the color of your sputum (e.g., none, dry cough; clear, white, yellow, green)     Brown  4. HEMOPTYSIS: Are you coughing up any blood? If Yes, ask: How much? (e.g., flecks, streaks, tablespoons, etc.)     na 5. DIFFICULTY BREATHING: Are you having difficulty breathing? If Yes, ask: How bad is it? (e.g., mild, moderate, severe)      no 6. FEVER: Do you have a fever? If Yes, ask: What is your temperature, how was it measured, and when did it start?     No  7. CARDIAC HISTORY: Do you have any history of heart disease? (e.g., heart attack, congestive heart failure)      Hx parkinsons 8. LUNG HISTORY: Do you have any history of  lung disease?  (e.g., pulmonary embolus, asthma, emphysema)     na 9. PE RISK FACTORS: Do you have a history of blood clots? (or: recent major surgery, recent prolonged travel, bedridden)     na 10. OTHER SYMPTOMS: Do you have any other symptoms? (e.g., runny nose, wheezing, chest pain)       Sore throat , cough brown phlegm , body aches ,runny nose 11. PREGNANCY: Is there any chance you are pregnant? When was your last menstrual period?       na 12. TRAVEL: Have you traveled out of the country in the last month? (e.g., travel history, exposures)       na  Protocols used: Cough - Acute Productive-A-AH

## 2024-01-16 NOTE — Telephone Encounter (Signed)
 Pt has scheduled OV 01/16/2024.

## 2024-01-22 ENCOUNTER — Ambulatory Visit (HOSPITAL_BASED_OUTPATIENT_CLINIC_OR_DEPARTMENT_OTHER): Admission: RE | Admit: 2024-01-22 | Discharge: 2024-01-22 | Attending: Family

## 2024-01-22 DIAGNOSIS — I82462 Acute embolism and thrombosis of left calf muscular vein: Secondary | ICD-10-CM

## 2024-01-24 ENCOUNTER — Other Ambulatory Visit (HOSPITAL_BASED_OUTPATIENT_CLINIC_OR_DEPARTMENT_OTHER): Payer: Self-pay

## 2024-01-25 ENCOUNTER — Encounter: Payer: Self-pay | Admitting: Family

## 2024-01-28 ENCOUNTER — Encounter: Payer: Self-pay | Admitting: Neurology

## 2024-01-30 ENCOUNTER — Other Ambulatory Visit: Payer: Self-pay | Admitting: Neurology

## 2024-02-01 ENCOUNTER — Other Ambulatory Visit: Payer: Self-pay | Admitting: Neurology

## 2024-02-01 ENCOUNTER — Other Ambulatory Visit: Payer: Self-pay | Admitting: Medical

## 2024-02-02 NOTE — Telephone Encounter (Signed)
 Refilled lorazepam  but pt need to update contract. I think most recent should be in media? If you can't find then have him update contract within a week.

## 2024-02-04 ENCOUNTER — Other Ambulatory Visit: Payer: Self-pay | Admitting: Medical

## 2024-02-04 NOTE — Telephone Encounter (Signed)
 Called pt left detailed vm and for him to call back

## 2024-02-04 NOTE — Telephone Encounter (Signed)
 Called pt for 2nd time still no answer

## 2024-02-06 ENCOUNTER — Encounter: Payer: Self-pay | Admitting: Medical

## 2024-02-06 MED ORDER — LORAZEPAM 0.5 MG PO TABS: ORAL_TABLET | ORAL | 0 refills | Status: AC

## 2024-02-06 NOTE — Telephone Encounter (Signed)
 Requesting: Ativan  0.5 MG Contract: 11/24/2022 UDS: 10/11/2023 Last Visit: 12/28/2023 Next Visit: None Last Refill: 12/07/2023  Please Advise

## 2024-02-22 ENCOUNTER — Ambulatory Visit (INDEPENDENT_AMBULATORY_CARE_PROVIDER_SITE_OTHER): Admitting: Medical

## 2024-02-22 VITALS — BP 112/82 | HR 66 | Temp 97.7°F | Resp 15 | Ht 72.0 in | Wt 162.6 lb

## 2024-02-22 DIAGNOSIS — Z79899 Other long term (current) drug therapy: Secondary | ICD-10-CM

## 2024-02-22 DIAGNOSIS — Z1159 Encounter for screening for other viral diseases: Secondary | ICD-10-CM

## 2024-02-22 DIAGNOSIS — F419 Anxiety disorder, unspecified: Secondary | ICD-10-CM

## 2024-02-22 DIAGNOSIS — Z0000 Encounter for general adult medical examination without abnormal findings: Secondary | ICD-10-CM

## 2024-02-22 DIAGNOSIS — Z125 Encounter for screening for malignant neoplasm of prostate: Secondary | ICD-10-CM

## 2024-02-22 NOTE — Patient Instructions (Addendum)
 For you wellness exam today I have ordered cbc, cmp, psa, hep c antibody and lipid panel. Future fasting.  Vaccines up to date.  Call gi office for colonoscopy  Recommend exercise and healthy diet.  We will let you know lab results as they come in.  Follow up date appointment will be determined after lab review.    For anxiety continue ativan . Refill when needed. Update contract and uds      Preventive Care 11-62 Years Old, Male Preventive care refers to lifestyle choices and visits with your health care provider that can promote health and wellness. Preventive care visits are also called wellness exams. What can I expect for my preventive care visit? Counseling During your preventive care visit, your health care provider may ask about your: Medical history, including: Past medical problems. Family medical history. Current health, including: Emotional well-being. Home life and relationship well-being. Sexual activity. Lifestyle, including: Alcohol, nicotine or tobacco, and drug use. Access to firearms. Diet, exercise, and sleep habits. Safety issues such as seatbelt and bike helmet use. Sunscreen use. Work and work astronomer. Physical exam Your health care provider will check your: Height and weight. These may be used to calculate your BMI (body mass index). BMI is a measurement that tells if you are at a healthy weight. Waist circumference. This measures the distance around your waistline. This measurement also tells if you are at a healthy weight and may help predict your risk of certain diseases, such as type 2 diabetes and high blood pressure. Heart rate and blood pressure. Body temperature. Skin for abnormal spots. What immunizations do I need?  Vaccines are usually given at various ages, according to a schedule. Your health care provider will recommend vaccines for you based on your age, medical history, and lifestyle or other factors, such as travel or where you  work. What tests do I need? Screening Your health care provider may recommend screening tests for certain conditions. This may include: Lipid and cholesterol levels. Diabetes screening. This is done by checking your blood sugar (glucose) after you have not eaten for a while (fasting). Hepatitis B test. Hepatitis C test. HIV (human immunodeficiency virus) test. STI (sexually transmitted infection) testing, if you are at risk. Lung cancer screening. Prostate cancer screening. Colorectal cancer screening. Talk with your health care provider about your test results, treatment options, and if necessary, the need for more tests. Follow these instructions at home: Eating and drinking  Eat a diet that includes fresh fruits and vegetables, whole grains, lean protein, and low-fat dairy products. Take vitamin and mineral supplements as recommended by your health care provider. Do not drink alcohol if your health care provider tells you not to drink. If you drink alcohol: Limit how much you have to 0-2 drinks a day. Know how much alcohol is in your drink. In the [place] one drink equals one 12 oz bottle of beer (355 mL), one 5 oz glass of wine (148 mL), or one 1 oz glass of hard liquor (44 mL). Lifestyle Brush your teeth every morning and night with fluoride toothpaste. Floss one time each day. Exercise for at least 30 minutes 5 or more days each week. Do not use any products that contain nicotine or tobacco. These products include cigarettes, chewing tobacco, and vaping devices, such as e-cigarettes. If you need help quitting, ask your health care provider. Do not use drugs. If you are sexually active, practice safe sex. Use a condom or other form of protection to prevent STIs.  Take aspirin only as told by your health care provider. Make sure that you understand how much to take and what form to take. Work with your health care provider to find out whether it is safe and beneficial for you to take  aspirin daily. Find healthy ways to manage stress, such as: Meditation, yoga, or listening to music. Journaling. Talking to a trusted person. Spending time with friends and family. Minimize exposure to UV radiation to reduce your risk of skin cancer. Safety Always wear your seat belt while driving or riding in a vehicle. Do not drive: If you have been drinking alcohol. Do not ride with someone who has been drinking. When you are tired or distracted. While texting. If you have been using any mind-altering substances or drugs. Wear a helmet and other protective equipment during sports activities. If you have firearms in your house, make sure you follow all gun safety procedures. What's next? Go to your health care provider once a year for an annual wellness visit. Ask your health care provider how often you should have your eyes and teeth checked. Stay up to date on all vaccines. This information is not intended to replace advice given to you by your health care provider. Make sure you discuss any questions you have with your health care provider. Document Revised: 07/28/2020 Document Reviewed: 07/28/2020 Elsevier Patient Education  2024 [employer].                      Referring To Provider Information [facility] [address] [place] [id number] [phone]

## 2024-02-22 NOTE — Progress Notes (Signed)
 "  Subjective:    Patient ID: Mark [name], male    DOB: 05/26/1960, 64 y.o.   MRN: [id number]  HPI  n for wellness exam. He is fasting.  Pt manages show room sales, Pt walking/jog 3-4 times a weeks.(3-4 miles).  Some boxing and cardio. Pt does drink 2 cups of coffee a day. Married- 2 children    Family history of prostate cancer. Dx at 70 years.    Due for colonoscopy next April 2025. Gave info to call and get scheduled.   Vaccines up to date.   Anxiety hx- on ativan . No reported adverse side effects. Due for update controlled med contract and uds. Last rx 60 tabs a month.      Review of Systems  Constitutional:  Negative for chills, fatigue and fever.  HENT:  Negative for congestion, drooling and ear discharge.   Respiratory:  Negative for cough, chest tightness, shortness of breath and wheezing.   Cardiovascular:  Negative for chest pain and palpitations.  Gastrointestinal:  Negative for abdominal pain, blood in stool and constipation.  Genitourinary:  Negative for dysuria and frequency.  Musculoskeletal:  Negative for back pain, myalgias and neck stiffness.  Skin:  Negative for rash.  Neurological:  Negative for dizziness, seizures and headaches.  Hematological:  Negative for adenopathy. Does not bruise/bleed easily.  Psychiatric/Behavioral:  Negative for behavioral problems and decreased concentration.     Past Medical History:  Diagnosis Date   Family history of prostate cancer     DRE WNL; PSA never > 0.38   Fasting hyperglycemia     PMH of   GERD (gastroesophageal reflux disease)    Parkinson's disease (HCC)    Tinnitus    w/o hearing loss     Social History   Socioeconomic History   Marital status: Married    Spouse name: Not on file   Number of children: Not on file   Years of education: Not on file   Highest education level: Associate degree: academic program  Occupational History   Not on file  Tobacco Use   Smoking status: Never   Smokeless  tobacco: Never  Substance and Sexual Activity   Alcohol use: Yes    Comment: very rare wine , < one per month per pt.   Drug use: No   Sexual activity: Yes  Other Topics Concern   Not on file  Social History Narrative   Caffeine: 2-3 cups coffee/day   Social Drivers of Health   Tobacco Use: Low Risk (01/16/2024)   Patient History    Smoking Tobacco Use: Never    Smokeless Tobacco Use: Never    Passive Exposure: Not on file  Financial Resource Strain: Low Risk (05/16/2022)   Overall Financial Resource Strain (CARDIA)    Difficulty of Paying Living Expenses: Not hard at all  Food Insecurity: Low Risk (04/20/2023)   Received from [facility]   Epic    Within the past 12 months, you worried that your food would run out before you got money to buy more: Never true    Within the past 12 months, the food you bought just didn't last and you didn't have money to get more. : Never true  Transportation Needs: No Transportation Needs (04/20/2023)   Received from [facility]    In the past 12 months, has lack of reliable transportation kept you from medical appointments, meetings, work or from getting things needed for daily living? : No  Physical Activity:  Sufficiently Active (05/16/2022)   Exercise Vital Sign    Days of Exercise per Week: 5 days    Minutes of Exercise per Session: 40 min  Stress: No Stress Concern Present (05/16/2022)   [employer] of Occupational Health - Occupational Stress Questionnaire    Feeling of Stress : Not at all  Social Connections: Moderately Integrated (05/16/2022)   Social Connection and Isolation Panel    Frequency of Communication with Friends and Family: Once a week    Frequency of Social Gatherings with Friends and Family: Once a week    Attends Religious Services: More than 4 times per year    Active Member of [employer] or Organizations: Yes    Attends Banker Meetings: More than 4 times per year    Marital Status: Married   Catering Manager Violence: Not At Risk (04/04/2023)   Humiliation, Afraid, Rape, and Kick questionnaire    Fear of Current or Ex-Partner: No    Emotionally Abused: No    Physically Abused: No    Sexually Abused: No  Depression (PHQ2-9): Low Risk (01/16/2024)   Depression (PHQ2-9)    PHQ-2 Score: 0  Alcohol Screen: Low Risk (05/16/2022)   Alcohol Screen    Last Alcohol Screening Score (AUDIT): 1  Housing: Low Risk (04/20/2023)   Received from [facility]   Epic    What is your living situation today?: I have a steady place to live    Think about the place you live. Do you have problems with any of the following? Choose all that apply:: None/None on this list  Utilities: Low Risk (04/20/2023)   Received from [facility]   Utilities    In the past 12 months has the electric, gas, oil, or water company threatened to shut off services in your home? : No  Health Literacy: Not on file    Past Surgical History:  Procedure Laterality Date   COLONOSCOPY  05/2013   Neg, Dr Abran   KNEE SURGERY Right    TONSILLECTOMY AND ADENOIDECTOMY     VASECTOMY      Family History  Problem Relation Age of Onset   COPD Mother        smoker   Prostate cancer Father 61       no longer with prostate cancer   Breast cancer Sister    COPD Paternal Aunt        smoker   Stroke Maternal Grandfather         late 75s   Heart disease Neg Hx    Colon cancer Neg Hx    Esophageal cancer Neg Hx    Pancreatic cancer Neg Hx    Rectal cancer Neg Hx    Stomach cancer Neg Hx    Diabetes Neg Hx    Other Neg Hx        low testosterone    Parkinson's disease Neg Hx     Allergies[1]  Medications Ordered Prior to Encounter[2]  BP 112/82   Pulse 66   Temp 97.7 F (36.5 C) (Oral)   Resp 15   Ht 6' (1.829 m)   Wt 162 lb 9.6 oz (73.8 kg)   SpO2 99%   BMI 22.05 kg/m         Objective:   Physical Exam  General Mental Status- Alert. General Appearance- Not in acute distress.   Skin General: Color-  Normal Color. Moisture- Normal Moisture.  Neck No JVD.  Chest and Lung Exam Auscultation: Breath Sounds:-CTA  Cardiovascular Auscultation:Rythm- RRR Murmurs & Other Heart Sounds:Auscultation of the heart reveals- No Murmurs.  Abdomen Inspection:-Inspeection Normal. Palpation/Percussion:Note:No mass. Palpation and Percussion of the abdomen reveal- Non Tender, Non Distended + BS, no rebound or guarding.   Neurologic Cranial Nerve exam:- CN III-XII intact(No nystagmus), symmetric smile. Strength:- 5/5 equal and symmetric strength both upper and lower extremities.       Assessment & Plan:   For you wellness exam today I have ordered cbc, cmp, psa, hep c antibody and lipid panel.  Future fasting.  Vaccines up to date.  Call gi office for colonoscopy  Recommend exercise and healthy diet.  We will let you know lab results as they come in.  Follow up date appointment will be determined after lab review.    For anxiety continue ativan . Refill when needed. Update contract and uds    [employer], PA-C     [1]  Allergies Allergen Reactions   Penicillins     ? Reaction @ age 67  [2]  Current Outpatient Medications on File Prior to Visit  Medication Sig Dispense Refill   benzonatate  (TESSALON ) 100 MG capsule Take 1 capsule (100 mg total) by mouth 3 (three) times daily as needed. 30 capsule 0   Cholecalciferol (VITAMIN D3 MAXIMUM STRENGTH) 125 MCG (5000 UT) capsule Take 5,000 Units by mouth daily.     coconut oil OIL Apply 1 Application topically as needed.     Docosahexaenoic Acid (DHA PO) Take 1 tablet by mouth daily at 6 (six) AM. Unsure dose     escitalopram  (LEXAPRO ) 10 MG tablet TAKE 1.5 TABLET BY MOUTH DAILY 45 tablet 4   Iron , Ferrous Sulfate , 325 (65 Fe) MG TABS Take 325 mg by mouth daily. 30 tablet 1   LORazepam  (ATIVAN ) 0.5 MG tablet TAKE 1 TABLET BY MOUTH 2 TIMES A DAY AS NEEDED FOR ANXIETY 60 tablet 0   methylPREDNISolone  (MEDROL ) 4 MG tablet Standard 6  day taper dose pack 21 tablet 0   rivaroxaban  (XARELTO ) 20 MG TABS tablet Take 1 tablet (20 mg total) by mouth daily with supper. 30 tablet 5   SODIUM FLUORIDE 5000 SENSITIVE 1.1-5 % GEL daily at 6 (six) AM.     Turmeric (QC TUMERIC COMPLEX PO) Take 1 tablet by mouth daily at 6 (six) AM. Unsure dose     amantadine (SYMMETREL) 100 MG capsule Take 100 mg by mouth daily. (Patient not taking: Reported on 02/22/2024)     carbidopa -levodopa  (SINEMET  CR) 50-200 MG tablet Take 1 tablet by mouth at bedtime. (Patient not taking: Reported on 02/22/2024) 30 tablet 11   carbidopa -levodopa  (SINEMET  IR) 25-100 MG tablet TAKE 1 TABLET BY MOUTH FOUR TIMES A DAY (Patient not taking: Reported on 02/22/2024) 120 tablet 11   chlorhexidine (PERIDEX) 0.12 % solution Use as directed in the mouth or throat daily at 6 (six) AM. (Patient not taking: Reported on 02/22/2024)     gabapentin  (NEURONTIN ) 300 MG capsule TAKE 1 CAPSULE BY MOUTH 2 TIMES A DAY (Patient not taking: Reported on 02/22/2024) 60 capsule 5   rOPINIRole  (REQUIP ) 4 MG tablet Take 1 tablet (4 mg total) by mouth at bedtime. (Patient not taking: Reported on 02/22/2024) 30 tablet 11   No current facility-administered medications on file prior to visit.   "

## 2024-02-25 LAB — DRUG MONITORING PANEL 376104, URINE
Alphahydroxyalprazolam: NEGATIVE ng/mL
Alphahydroxymidazolam: NEGATIVE ng/mL
Alphahydroxytriazolam: NEGATIVE ng/mL
Aminoclonazepam: NEGATIVE ng/mL
Amphetamines: NEGATIVE ng/mL
Barbiturates: NEGATIVE ng/mL
Benzodiazepines: POSITIVE ng/mL — AB
Cocaine Metabolite: NEGATIVE ng/mL
Desmethyltramadol: NEGATIVE ng/mL
Hydroxyethylflurazepam: NEGATIVE ng/mL
Lorazepam: 488 ng/mL — ABNORMAL HIGH
Nordiazepam: NEGATIVE ng/mL
Opiates: NEGATIVE ng/mL
Oxazepam: NEGATIVE ng/mL
Oxycodone: NEGATIVE ng/mL
Temazepam: NEGATIVE ng/mL
Tramadol: NEGATIVE ng/mL

## 2024-02-25 LAB — DM TEMPLATE

## 2024-02-26 ENCOUNTER — Other Ambulatory Visit

## 2024-02-26 ENCOUNTER — Ambulatory Visit: Payer: Self-pay | Admitting: Medical

## 2024-02-26 DIAGNOSIS — Z125 Encounter for screening for malignant neoplasm of prostate: Secondary | ICD-10-CM | POA: Diagnosis not present

## 2024-02-26 DIAGNOSIS — Z1159 Encounter for screening for other viral diseases: Secondary | ICD-10-CM | POA: Diagnosis not present

## 2024-02-26 DIAGNOSIS — Z0000 Encounter for general adult medical examination without abnormal findings: Secondary | ICD-10-CM | POA: Diagnosis not present

## 2024-02-26 LAB — CBC WITH DIFFERENTIAL/PLATELET
Basophils Absolute: 0 K/uL (ref 0.0–0.1)
Basophils Relative: 0.8 % (ref 0.0–3.0)
Eosinophils Absolute: 0.1 K/uL (ref 0.0–0.7)
Eosinophils Relative: 1.8 % (ref 0.0–5.0)
HCT: 41.2 % (ref 39.0–52.0)
Hemoglobin: 13.9 g/dL (ref 13.0–17.0)
Lymphocytes Relative: 27.6 % (ref 12.0–46.0)
Lymphs Abs: 1 K/uL (ref 0.7–4.0)
MCHC: 33.8 g/dL (ref 30.0–36.0)
MCV: 95.9 fl (ref 78.0–100.0)
Monocytes Absolute: 0.4 K/uL (ref 0.1–1.0)
Monocytes Relative: 11.2 % (ref 3.0–12.0)
Neutro Abs: 2.2 K/uL (ref 1.4–7.7)
Neutrophils Relative %: 58.6 % (ref 43.0–77.0)
Platelets: 143 K/uL — ABNORMAL LOW (ref 150.0–400.0)
RBC: 4.3 Mil/uL (ref 4.22–5.81)
RDW: 12.9 % (ref 11.5–15.5)
WBC: 3.7 K/uL — ABNORMAL LOW (ref 4.0–10.5)

## 2024-02-26 LAB — COMPREHENSIVE METABOLIC PANEL WITH GFR
ALT: 4 U/L (ref 3–53)
AST: 17 U/L (ref 5–37)
Albumin: 4.3 g/dL (ref 3.5–5.2)
Alkaline Phosphatase: 64 U/L (ref 39–117)
BUN: 15 mg/dL (ref 6–23)
CO2: 31 meq/L (ref 19–32)
Calcium: 9.1 mg/dL (ref 8.4–10.5)
Chloride: 103 meq/L (ref 96–112)
Creatinine, Ser: 0.79 mg/dL (ref 0.40–1.50)
GFR: 96.07 mL/min
Glucose, Bld: 92 mg/dL (ref 70–99)
Potassium: 4 meq/L (ref 3.5–5.1)
Sodium: 140 meq/L (ref 135–145)
Total Bilirubin: 0.8 mg/dL (ref 0.2–1.2)
Total Protein: 7 g/dL (ref 6.0–8.3)

## 2024-02-26 LAB — LIPID PANEL
Cholesterol: 215 mg/dL — ABNORMAL HIGH (ref 28–200)
HDL: 74.3 mg/dL
LDL Cholesterol: 130 mg/dL — ABNORMAL HIGH (ref 10–99)
NonHDL: 140.93
Total CHOL/HDL Ratio: 3
Triglycerides: 57 mg/dL (ref 10.0–149.0)
VLDL: 11.4 mg/dL (ref 0.0–40.0)

## 2024-02-26 LAB — PSA: PSA: 0.47 ng/mL (ref 0.10–4.00)

## 2024-02-27 LAB — HEPATITIS C ANTIBODY: Hepatitis C Ab: NONREACTIVE

## 2024-03-05 ENCOUNTER — Other Ambulatory Visit: Payer: Self-pay | Admitting: Family Medicine

## 2024-03-05 ENCOUNTER — Encounter: Payer: Self-pay | Admitting: Medical

## 2024-03-05 MED ORDER — LORAZEPAM 0.5 MG PO TABS: 0.5000 mg | ORAL_TABLET | Freq: Two times a day (BID) | ORAL | 0 refills | Status: AC | PRN

## 2024-03-05 NOTE — Addendum Note (Signed)
 Addended by: DORINA DALLAS DORINA PA-C M on: 03/05/2024 07:56 PM   Modules accepted: Orders

## 2024-06-02 ENCOUNTER — Ambulatory Visit: Admitting: Adult Health

## 2024-07-01 ENCOUNTER — Inpatient Hospital Stay: Admitting: Family

## 2024-07-01 ENCOUNTER — Inpatient Hospital Stay: Attending: Hematology & Oncology

## 2025-02-24 ENCOUNTER — Encounter: Admitting: Medical
